# Patient Record
Sex: Male | Born: 1960 | Race: White | Hispanic: No | Marital: Married | Smoking: Current every day smoker
Health system: Southern US, Community
[De-identification: ages and names within clinical notes are randomized; demographics above are authoritative.]

## PROBLEM LIST (undated history)

## (undated) DIAGNOSIS — J449 Chronic obstructive pulmonary disease, unspecified: Secondary | ICD-10-CM

## (undated) DIAGNOSIS — M87051 Idiopathic aseptic necrosis of right femur: Secondary | ICD-10-CM

## (undated) DIAGNOSIS — Z720 Tobacco use: Secondary | ICD-10-CM

## (undated) DIAGNOSIS — K219 Gastro-esophageal reflux disease without esophagitis: Secondary | ICD-10-CM

## (undated) DIAGNOSIS — C801 Malignant (primary) neoplasm, unspecified: Secondary | ICD-10-CM

## (undated) DIAGNOSIS — I639 Cerebral infarction, unspecified: Secondary | ICD-10-CM

## (undated) DIAGNOSIS — C61 Malignant neoplasm of prostate: Secondary | ICD-10-CM

## (undated) DIAGNOSIS — Z8719 Personal history of other diseases of the digestive system: Secondary | ICD-10-CM

## (undated) DIAGNOSIS — I509 Heart failure, unspecified: Secondary | ICD-10-CM

## (undated) DIAGNOSIS — E119 Type 2 diabetes mellitus without complications: Secondary | ICD-10-CM

## (undated) DIAGNOSIS — F419 Anxiety disorder, unspecified: Secondary | ICD-10-CM

## (undated) DIAGNOSIS — I219 Acute myocardial infarction, unspecified: Secondary | ICD-10-CM

## (undated) DIAGNOSIS — G2581 Restless legs syndrome: Secondary | ICD-10-CM

## (undated) DIAGNOSIS — E785 Hyperlipidemia, unspecified: Secondary | ICD-10-CM

## (undated) DIAGNOSIS — F429 Obsessive-compulsive disorder, unspecified: Secondary | ICD-10-CM

## (undated) DIAGNOSIS — I10 Essential (primary) hypertension: Secondary | ICD-10-CM

## (undated) DIAGNOSIS — D649 Anemia, unspecified: Secondary | ICD-10-CM

## (undated) DIAGNOSIS — F17210 Nicotine dependence, cigarettes, uncomplicated: Secondary | ICD-10-CM

## (undated) DIAGNOSIS — M87052 Idiopathic aseptic necrosis of left femur: Secondary | ICD-10-CM

## (undated) DIAGNOSIS — F5104 Psychophysiologic insomnia: Secondary | ICD-10-CM

## (undated) DIAGNOSIS — R9431 Abnormal electrocardiogram [ECG] [EKG]: Secondary | ICD-10-CM

## (undated) DIAGNOSIS — R972 Elevated prostate specific antigen [PSA]: Secondary | ICD-10-CM

## (undated) HISTORY — PX: CAROTID STENT: SHX1301

## (undated) HISTORY — DX: Chronic obstructive pulmonary disease, unspecified: J44.9

## (undated) HISTORY — PX: CORONARY ANGIOPLASTY WITH STENT PLACEMENT: SHX49

## (undated) HISTORY — PX: EYE SURGERY: SHX253

## (undated) HISTORY — DX: Gastro-esophageal reflux disease without esophagitis: K21.9

## (undated) HISTORY — PX: ANKLE FRACTURE SURGERY: SHX122

## (undated) HISTORY — PX: HERNIA REPAIR: SHX51

## (undated) HISTORY — DX: Heart failure, unspecified: I50.9

## (undated) HISTORY — DX: Malignant (primary) neoplasm, unspecified: C80.1

---

## 2003-06-16 ENCOUNTER — Ambulatory Visit (HOSPITAL_COMMUNITY): Admission: RE | Admit: 2003-06-16 | Discharge: 2003-06-17 | Payer: Self-pay | Admitting: Cardiovascular Disease

## 2003-11-01 ENCOUNTER — Other Ambulatory Visit: Payer: Self-pay

## 2006-07-26 ENCOUNTER — Other Ambulatory Visit: Payer: Self-pay

## 2006-07-26 ENCOUNTER — Emergency Department: Payer: Self-pay | Admitting: Emergency Medicine

## 2006-08-12 ENCOUNTER — Inpatient Hospital Stay: Payer: Self-pay | Admitting: Internal Medicine

## 2006-08-12 ENCOUNTER — Other Ambulatory Visit: Payer: Self-pay

## 2006-08-13 ENCOUNTER — Other Ambulatory Visit: Payer: Self-pay

## 2006-08-14 ENCOUNTER — Other Ambulatory Visit: Payer: Self-pay

## 2009-10-11 ENCOUNTER — Inpatient Hospital Stay: Payer: Self-pay | Admitting: Internal Medicine

## 2010-12-23 ENCOUNTER — Inpatient Hospital Stay: Payer: Self-pay | Admitting: Internal Medicine

## 2011-04-19 ENCOUNTER — Emergency Department: Payer: Self-pay | Admitting: Emergency Medicine

## 2011-11-19 ENCOUNTER — Emergency Department: Payer: Self-pay | Admitting: *Deleted

## 2011-11-19 LAB — CBC
HCT: 41.9 % (ref 40.0–52.0)
MCH: 30.8 pg (ref 26.0–34.0)
MCHC: 33.9 g/dL (ref 32.0–36.0)
RDW: 13.3 % (ref 11.5–14.5)
WBC: 8 10*3/uL (ref 3.8–10.6)

## 2011-11-19 LAB — COMPREHENSIVE METABOLIC PANEL
Albumin: 3.7 g/dL (ref 3.4–5.0)
Alkaline Phosphatase: 57 U/L (ref 50–136)
Anion Gap: 8 (ref 7–16)
Calcium, Total: 8.7 mg/dL (ref 8.5–10.1)
Glucose: 138 mg/dL — ABNORMAL HIGH (ref 65–99)
Potassium: 3.1 mmol/L — ABNORMAL LOW (ref 3.5–5.1)
SGOT(AST): 19 U/L (ref 15–37)
SGPT (ALT): 26 U/L
Total Protein: 7.6 g/dL (ref 6.4–8.2)

## 2011-11-19 LAB — TROPONIN I: Troponin-I: <0.02 ng/mL

## 2011-11-20 ENCOUNTER — Inpatient Hospital Stay: Payer: Self-pay | Admitting: Internal Medicine

## 2011-11-20 LAB — COMPREHENSIVE METABOLIC PANEL
Albumin: 3.9 g/dL (ref 3.4–5.0)
Alkaline Phosphatase: 58 U/L (ref 50–136)
BUN: 7 mg/dL (ref 7–18)
Bilirubin,Total: 0.5 mg/dL (ref 0.2–1.0)
Co2: 30 mmol/L (ref 21–32)
Creatinine: 0.78 mg/dL (ref 0.60–1.30)
EGFR (African American): >60
EGFR (Non-African Amer.): >60
Osmolality: 278 (ref 275–301)
SGPT (ALT): 28 U/L
Sodium: 141 mmol/L (ref 136–145)

## 2011-11-20 LAB — TROPONIN I
Troponin-I: <0.02 ng/mL
Troponin-I: <0.02 ng/mL

## 2011-11-20 LAB — URINALYSIS, COMPLETE
Bilirubin,UR: NEGATIVE
Blood: NEGATIVE
Glucose,UR: NEGATIVE mg/dL (ref 0–75)
Ph: 6 (ref 4.5–8.0)
Specific Gravity: 1.047 (ref 1.003–1.030)

## 2011-11-20 LAB — CBC
HCT: 42.8 % (ref 40.0–52.0)
MCH: 31.1 pg (ref 26.0–34.0)
MCHC: 34.3 g/dL (ref 32.0–36.0)
MCV: 91 fL (ref 80–100)
RBC: 4.73 10*6/uL (ref 4.40–5.90)
RDW: 13.2 % (ref 11.5–14.5)

## 2011-11-20 LAB — CK TOTAL AND CKMB (NOT AT ARMC): CK, Total: 120 U/L (ref 35–232)

## 2011-11-21 LAB — LIPID PANEL
HDL Cholesterol: 29 mg/dL — ABNORMAL LOW (ref 40–60)
Ldl Cholesterol, Calc: 66 mg/dL (ref 0–100)
Triglycerides: 122 mg/dL (ref 0–200)

## 2011-11-21 LAB — MAGNESIUM: Magnesium: 1.7 mg/dL — ABNORMAL LOW

## 2011-11-21 LAB — POTASSIUM: Potassium: 3.4 mmol/L — ABNORMAL LOW (ref 3.5–5.1)

## 2011-11-21 LAB — CK TOTAL AND CKMB (NOT AT ARMC)
CK, Total: 91 U/L (ref 35–232)
CK-MB: 1 ng/mL (ref 0.5–3.6)
CK-MB: 0.9 ng/mL (ref 0.5–3.6)

## 2012-03-20 ENCOUNTER — Emergency Department: Payer: Self-pay | Admitting: Emergency Medicine

## 2012-05-29 ENCOUNTER — Ambulatory Visit: Payer: Self-pay | Admitting: Adult Health

## 2012-12-22 ENCOUNTER — Emergency Department: Payer: Self-pay | Admitting: Emergency Medicine

## 2012-12-22 LAB — COMPREHENSIVE METABOLIC PANEL
Albumin: 3.5 g/dL (ref 3.4–5.0)
Anion Gap: 8 (ref 7–16)
Calcium, Total: 8.5 mg/dL (ref 8.5–10.1)
Chloride: 105 mmol/L (ref 98–107)
Co2: 23 mmol/L (ref 21–32)
Creatinine: 0.57 mg/dL — ABNORMAL LOW (ref 0.60–1.30)
EGFR (African American): >60
Sodium: 136 mmol/L (ref 136–145)
Total Protein: 7.3 g/dL (ref 6.4–8.2)

## 2012-12-22 LAB — CBC
HGB: 15.3 g/dL (ref 13.0–18.0)
MCHC: 35.1 g/dL (ref 32.0–36.0)
RDW: 13.2 % (ref 11.5–14.5)

## 2012-12-22 LAB — TROPONIN I: Troponin-I: <0.02 ng/mL

## 2012-12-22 LAB — CK TOTAL AND CKMB (NOT AT ARMC): CK-MB: 2.4 ng/mL (ref 0.5–3.6)

## 2012-12-22 LAB — PROTIME-INR
INR: 1
Prothrombin Time: 13.2 secs (ref 11.5–14.7)

## 2012-12-22 LAB — APTT: Activated PTT: 34.7 secs (ref 23.6–35.9)

## 2013-05-21 ENCOUNTER — Ambulatory Visit: Payer: Self-pay | Admitting: Adult Health

## 2013-10-07 DIAGNOSIS — I639 Cerebral infarction, unspecified: Secondary | ICD-10-CM

## 2013-10-07 HISTORY — DX: Cerebral infarction, unspecified: I63.9

## 2014-02-03 ENCOUNTER — Ambulatory Visit: Payer: Self-pay | Admitting: Urology

## 2014-03-14 ENCOUNTER — Emergency Department: Payer: Self-pay | Admitting: Emergency Medicine

## 2014-05-16 ENCOUNTER — Emergency Department: Payer: Self-pay | Admitting: Emergency Medicine

## 2014-05-18 ENCOUNTER — Emergency Department: Payer: Self-pay | Admitting: Emergency Medicine

## 2014-05-18 LAB — CBC WITH DIFFERENTIAL/PLATELET
Basophil #: 0.1 10*3/uL (ref 0.0–0.1)
Basophil %: 0.7 %
EOS ABS: 0.2 10*3/uL (ref 0.0–0.7)
Eosinophil %: 2.4 %
HCT: 42.8 % (ref 40.0–52.0)
HGB: 14.7 g/dL (ref 13.0–18.0)
LYMPHS ABS: 2.6 10*3/uL (ref 1.0–3.6)
Lymphocyte %: 27.1 %
MCH: 31.5 pg (ref 26.0–34.0)
MCHC: 34.4 g/dL (ref 32.0–36.0)
MCV: 91 fL (ref 80–100)
MONO ABS: 0.6 x10 3/mm (ref 0.2–1.0)
Monocyte %: 6.3 %
NEUTROS PCT: 63.5 %
Neutrophil #: 6.2 10*3/uL (ref 1.4–6.5)
Platelet: 222 10*3/uL (ref 150–440)
RBC: 4.68 10*6/uL (ref 4.40–5.90)
RDW: 13.3 % (ref 11.5–14.5)
WBC: 9.7 10*3/uL (ref 3.8–10.6)

## 2014-05-18 LAB — COMPREHENSIVE METABOLIC PANEL
ALT: 37 U/L
Albumin: 3.6 g/dL (ref 3.4–5.0)
Alkaline Phosphatase: 67 U/L
Anion Gap: 7 (ref 7–16)
BILIRUBIN TOTAL: 0.3 mg/dL (ref 0.2–1.0)
BUN: 7 mg/dL (ref 7–18)
CHLORIDE: 106 mmol/L (ref 98–107)
Calcium, Total: 8.6 mg/dL (ref 8.5–10.1)
Co2: 27 mmol/L (ref 21–32)
Creatinine: 0.84 mg/dL (ref 0.60–1.30)
EGFR (African American): >60
EGFR (Non-African Amer.): >60
Glucose: 121 mg/dL — ABNORMAL HIGH (ref 65–99)
Osmolality: 279 (ref 275–301)
Potassium: 3.3 mmol/L — ABNORMAL LOW (ref 3.5–5.1)
SGOT(AST): 25 U/L (ref 15–37)
SODIUM: 140 mmol/L (ref 136–145)
Total Protein: 7.5 g/dL (ref 6.4–8.2)

## 2014-05-18 LAB — URINALYSIS, COMPLETE
BILIRUBIN, UR: NEGATIVE
Bacteria: NONE SEEN
Blood: NEGATIVE
Glucose,UR: NEGATIVE mg/dL (ref 0–75)
KETONE: NEGATIVE
Leukocyte Esterase: NEGATIVE
Nitrite: NEGATIVE
Ph: 6 (ref 4.5–8.0)
Protein: NEGATIVE
RBC,UR: NONE SEEN /HPF (ref 0–5)
Specific Gravity: 1.002 (ref 1.003–1.030)
Squamous Epithelial: NONE SEEN
WBC UR: NONE SEEN /HPF (ref 0–5)

## 2014-05-18 LAB — LIPASE, BLOOD: LIPASE: 138 U/L (ref 73–393)

## 2014-06-03 ENCOUNTER — Emergency Department: Payer: Self-pay | Admitting: Internal Medicine

## 2014-06-03 LAB — URINALYSIS, COMPLETE
BILIRUBIN, UR: NEGATIVE
Bacteria: NONE SEEN
Blood: NEGATIVE
GLUCOSE, UR: NEGATIVE mg/dL (ref 0–75)
Ketone: NEGATIVE
LEUKOCYTE ESTERASE: NEGATIVE
Nitrite: NEGATIVE
PROTEIN: NEGATIVE
Ph: 5 (ref 4.5–8.0)
Specific Gravity: 1.015 (ref 1.003–1.030)
Squamous Epithelial: NONE SEEN
WBC UR: 1 /HPF (ref 0–5)

## 2014-06-03 LAB — CBC WITH DIFFERENTIAL/PLATELET
BASOS PCT: 0.7 %
Basophil #: 0.1 10*3/uL (ref 0.0–0.1)
EOS ABS: 0.2 10*3/uL (ref 0.0–0.7)
Eosinophil %: 3 %
HCT: 44.8 % (ref 40.0–52.0)
HGB: 14.8 g/dL (ref 13.0–18.0)
LYMPHS PCT: 29.6 %
Lymphocyte #: 2.1 10*3/uL (ref 1.0–3.6)
MCH: 30.7 pg (ref 26.0–34.0)
MCHC: 33.1 g/dL (ref 32.0–36.0)
MCV: 93 fL (ref 80–100)
MONO ABS: 0.3 x10 3/mm (ref 0.2–1.0)
Monocyte %: 4.5 %
Neutrophil #: 4.5 10*3/uL (ref 1.4–6.5)
Neutrophil %: 62.2 %
Platelet: 227 10*3/uL (ref 150–440)
RBC: 4.83 10*6/uL (ref 4.40–5.90)
RDW: 13.4 % (ref 11.5–14.5)
WBC: 7.3 10*3/uL (ref 3.8–10.6)

## 2014-06-03 LAB — COMPREHENSIVE METABOLIC PANEL
ALBUMIN: 3.6 g/dL (ref 3.4–5.0)
ALK PHOS: 71 U/L
AST: 24 U/L (ref 15–37)
Anion Gap: 3 — ABNORMAL LOW (ref 7–16)
BUN: 8 mg/dL (ref 7–18)
Bilirubin,Total: 0.3 mg/dL (ref 0.2–1.0)
CALCIUM: 8.8 mg/dL (ref 8.5–10.1)
CHLORIDE: 106 mmol/L (ref 98–107)
CREATININE: 0.9 mg/dL (ref 0.60–1.30)
Co2: 28 mmol/L (ref 21–32)
EGFR (Non-African Amer.): >60
GLUCOSE: 141 mg/dL — AB (ref 65–99)
Osmolality: 275 (ref 275–301)
Potassium: 3.7 mmol/L (ref 3.5–5.1)
SGPT (ALT): 29 U/L
SODIUM: 137 mmol/L (ref 136–145)
TOTAL PROTEIN: 7.3 g/dL (ref 6.4–8.2)

## 2014-10-09 ENCOUNTER — Ambulatory Visit: Payer: Self-pay | Admitting: Emergency Medicine

## 2014-10-09 LAB — APTT: Activated PTT: 32.6 secs (ref 23.6–35.9)

## 2014-10-09 LAB — BASIC METABOLIC PANEL
Anion Gap: 7 (ref 7–16)
BUN: 8 mg/dL (ref 7–18)
CREATININE: 1.04 mg/dL (ref 0.60–1.30)
Calcium, Total: 8.3 mg/dL — ABNORMAL LOW (ref 8.5–10.1)
Chloride: 103 mmol/L (ref 98–107)
Co2: 30 mmol/L (ref 21–32)
EGFR (African American): >60
EGFR (Non-African Amer.): >60
Glucose: 127 mg/dL — ABNORMAL HIGH (ref 65–99)
Osmolality: 279 (ref 275–301)
POTASSIUM: 3.1 mmol/L — AB (ref 3.5–5.1)
Sodium: 140 mmol/L (ref 136–145)

## 2014-10-09 LAB — PRO B NATRIURETIC PEPTIDE: B-Type Natriuretic Peptide: 196 pg/mL — ABNORMAL HIGH (ref 0–125)

## 2014-10-09 LAB — PROTIME-INR
INR: 0.9
Prothrombin Time: 12.3 secs (ref 11.5–14.7)

## 2014-10-09 LAB — CBC
HCT: 38.9 % — ABNORMAL LOW (ref 40.0–52.0)
HGB: 13 g/dL (ref 13.0–18.0)
MCH: 30.8 pg (ref 26.0–34.0)
MCHC: 33.5 g/dL (ref 32.0–36.0)
MCV: 92 fL (ref 80–100)
Platelet: 207 10*3/uL (ref 150–440)
RBC: 4.23 10*6/uL — ABNORMAL LOW (ref 4.40–5.90)
RDW: 13.9 % (ref 11.5–14.5)
WBC: 6.7 10*3/uL (ref 3.8–10.6)

## 2014-10-09 LAB — TROPONIN I: Troponin-I: <0.02 ng/mL

## 2015-01-29 NOTE — Consult Note (Signed)
Brief Consult Note: Diagnosis: 1. Chest pain with slurred speech,likley CVA in patient with h/o CAD with stenting, tobacco abuse.   Comments: Patient has h/o CAD with multiple stents, has been on either Plavix or Effient but not ASA and presented woth signs of CVA with previous CVA in 12/2010. He denies CP at this time, cardiac enzymes negative, no caute EKG changes at this time. CTA was essentially normal.MRI pending. Will need to get echo and r/o coronary ischemia. After MRI and recommendations, suggest Lexiscan stress test.  Electronic Signatures: Radene KneeKhan, Shaukat Ali (MD)   (Signed 15-Feb-13 14:30)  Co-Signer: Brief Consult Note Maia PlanManzi, Elverda Wendel A (PA-C)   (Signed 14-Feb-13 13:08)  Authored: Brief Consult Note  Last Updated: 15-Feb-13 14:30 by Radene KneeKhan, Shaukat Ali (MD)

## 2015-01-29 NOTE — Consult Note (Signed)
PATIENT NAME:  Gary Benson, Gary Benson MR#:  [id number] DATE OF BIRTH:  1961/06/08  DATE OF CONSULTATION:  11/21/2011  REFERRING PHYSICIAN:  Dr. Marcelino Duster  CONSULTING PHYSICIAN:  Verta Ellen, PA-C/Dr. Adrian Blackwater   PRIMARY CARE PHYSICIAN: [facility]  REASON FOR CONSULTATION: Chest pain.  HISTORY OF PRESENT ILLNESS: Mr. Gary Benson is a pleasant 54 year old white male who looks much older than his stated age. Gary Benson has not been seen by our practice in several years, but does have history of coronary artery disease with stenting done both in 2004 and 2011. Gary Benson presented yesterday with droopiness of the right eyelid, a buzzing sensation in his head, chest pain, slurred speech, and numbness of his lips. Patient notes that his speech has improved but he still has numbness around his lip area. He has some residual left-sided weakness from a cerebrovascular accident he had in March 2012.   Patient had some chest pain and chest pressure yesterday that was intermittent and rated as 6 to 7/10 on the pain scale. Pain radiated into his left shoulder and was not associated with any shortness of breath, diaphoresis or nausea. He denies any orthopnea but does have coughing, wheezing, and dyspnea on exertion as he is a current smoker. Of note, patient denies any chest pain at this time.   PAST MEDICAL HISTORY:  1. Coronary artery disease. Review of his old record from our office showed he had a cardiac catheterization in September 2004 with a high-grade lesion in the LAD and second diagonal. He had descending/diagonal bifurcation PCI with stenting with two drug-eluting Cypher stents done at [facility]. Cardiac catheterization in 10/12/2009 with Dr. Kirke Corin with two stents to the mid RCA. 2. 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  3. Hypertension.  4. Hyperlipidemia.  5. Tobacco abuse.   PAST SURGICAL HISTORY: Leg/ankle reconstruction.    ALLERGIES: No known drug allergies.   HOME MEDICATIONS:  1. Chantix 0.5 mg p.o. daily.  2. Pravachol 40 mg p.o. at bedtime.  3. Metoprolol 25 mg twice daily.  4. Effient 10 mg p.o. once daily.  5. Nexium 40 mg p.o. once daily.   SOCIAL HISTORY: Patient is currently working. He smokes a 1.5 packs of cigarettes per day and has done so for the last 15 years. He denies any illicit drug use. Denies alcohol use and drinks about on 12-pack of sodas per day.   FAMILY HISTORY: Significant for coronary disease in his father and maternal uncles.   REVIEW OF SYSTEMS: Chest pain, coughing, weakness, occasional joint pain, slurred speech and numbness.   PHYSICAL EXAMINATION:  GENERAL: He is a well-developed, well-nourished male who looks older than his stated age.   VITAL SIGNS: Temperature 97.5 degrees Fahrenheit, heart rate 57, respiratory rate 18, blood pressure 119/76, oxygen saturation 97% on room air.   HEENT: Head atraumatic, normocephalic. Eyes: Patient wears glasses. Pupils are round, equal. He has pink conjunctivae. Ears and nose are normal to external inspection. Mouth good dentition. No posterior pharyngeal exudate or erythema.   NECK: Supple. Trachea is midline. Thyroid is smooth and mobile.   LUNGS: Clear to auscultation bilaterally with no adventitious breath sounds appreciated. He is not using any accessory muscles.   CARDIOVASCULAR: Regular rate and rhythm. No murmurs, rubs, or gallops appreciated.   ABDOMEN: Soft and nontender to palpation. Bowel sounds are present in all four quadrants. There is no hepatosplenomegaly noted.   EXTREMITIES: No cyanosis, clubbing, or  edema.   NEUROLOGICAL: Patient has decreased grip strength on the left.   LABORATORY, DIAGNOSTIC AND RADIOLOGICAL DATA: Glucose 83, BUN 7, creatinine 0.78, sodium 141, potassium 3.4, chloride 103, CO2 30, estimated GFR is greater than 60, VLDL 24, LDL 66, total cholesterol 403, triglycerides 122. HDL 29. Total  calcium 8.9, total protein 7.7, albumin 3.9, total bilirubin 0.5, alkaline phosphatase 58, AST 23, ALT 28, total CK 101, CK-MB 0.9. Troponin I is negative at 0.02 (on three occasions). White blood cell count 6.3, hemoglobin 14.7, hematocrit 42.8, platelet count 214,000. EKG on admission: Normal sinus rhythm with nonspecific ST changes. Heart rate 67. CTA of the head and neck with and without showed a normal CT angiogram of the brain, no acute abnormality of the intracranial vasculature, no evidence of carotid or vertebral artery dissection. No evidence of hemodynamically significant carotid stenotic lesion.   ASSESSMENT/PLAN:  1. Acute cerebrovascular accident. Patient has numbness on the right side of his face, lips, and had slurred speech. He has already been started on Lovenox and has been on Effient. CTA was unremarkable and MRI results are pending. 2. Chest pain in patient with history of coronary artery disease status post multiple stents to the RCA and LAD. Patient has not had cardiac follow up in many years. We will need to rule out in-stent restenosis. There is no signs of acute coronary syndrome. There are no acute ST-T wave changes on his EKG and cardiac enzymes have been negative x3. Need to get repeat echocardiogram and get LexiScan stress test after his acute stroke symptoms have subsided. 3. Hypertension. Patient's blood pressure well controlled on the current regimen.  4. Tobacco abuse. Patient is trying to quit smoking on Chantix. He has tried to quit smoke multiple times in the past. Importance of this was stressed to the patient since he does have a history of CVA and coronary artery disease.   The assessment and plan of this patient was discussed with Dr. Adrian Blackwater who agrees with the above. Thank you very much for this consultation and allowing [place] to follow this patient with you.   ____________________________ Verta Ellen, PA-C mam:cms D: 11/21/2011 13:23:10 ET T: 11/21/2011  14:07:09 ET JOB#: [id number] cc: Verta Ellen, PA-C, <Dictator> [facility] ELECTRONICALLY SIGNED 11/22/2011 11:32

## 2015-01-29 NOTE — Discharge Summary (Signed)
PATIENT NAME:  Gary Benson, Gary Benson MR#:  [id number] DATE OF BIRTH:  11/30/1960  DATE OF ADMISSION:  11/20/2011 DATE OF DISCHARGE:  11/22/2011  ADMITTING DIAGNOSES:  1. Cerebrovascular accident. 2. Chest pain. 3. Coronary artery disease. 4. Tobacco abuse.   DISCHARGE DIAGNOSES:  1. Dizziness, numbness, unsteady gait, likely old cerebrovascular accident residuals versus inner ear inflammation. No new stroke on magnetic resonance imaging of brain.  2. Chest pain, noncardiac likely, negative stress test.  3. Hypertension.  4. Hyperlipidemia with LDL 66. 5. Tobacco abuse.  6. Bradycardia to beta blockers.   DISCHARGE CONDITION: Stable.   DISCHARGE MEDICATIONS: Patient is to resume his outpatient medications which are:  1. Nexium 20 mg p.o. daily.  2. Pravastatin 40 mg p.o. at bedtime.  3. Tylenol 500 mg caplets 2 tablets as needed for headache.   ADDITIONAL MEDICATIONS:  1. Nicoderm patch 21 mg topically daily.  2. Toprol-XL 25 mg p.o. daily.  3. Meclizine 25 mg p.o. t.i.d. as needed. 4. Aspirin 325 mg p.o. daily.  5. Lisinopril 2.5 mg p.o. daily.   NOTE: Patient is not to take high dose of metoprolol or Effient.    HOME OXYGEN: None.   DIET: 2 grams salt, low fat, low cholesterol.   PHYSICAL ACTIVITY LIMITATIONS: As tolerated.   REFERRAL: Home health physical therapy.   FOLLOW UP: Follow-up appointment with [facility] in two days after discharge.   CONSULTANT : Dr. Adrian Blackwater.   LABORATORY, DIAGNOSTIC AND RADIOLOGICAL DATA: MRI of brain without contrast 11/21/2011: Chronic white matter ischemic changes, stable when compared to previous study without evidence of acute abnormalities. Myoview stress test 11/22/2011 revealing normal study with normal left ventricular systolic function. CT angiography of head and neck with and without contrast combo 11/20/2011 showed no acute abnormalities in intracranial vasculature. No evidence of carotid or vertebral artery dissection.  There is no evidence of hemodynamically significant carotid artery stenosis. Echocardiogram 11/21/2011: Normal chamber size and function with no thrombi in heart. Ejection fraction equal or more than 55%.   HOSPITAL COURSE: Patient is a 54 year old male with past medical history significant for history of stroke, history of coronary artery disease who presented to the hospital with complaints of dizziness as well as some chest pains. He also had some slurring of speech as well as numbness in his lips. On arrival to the Emergency Room patient was noted to have temperature 97.8, pulse 62, respiration 20, blood pressure 145/84, saturation was normal. His physical exam was unremarkable. He had no decrease in sensation in upper and lower extremities. He was admitted, however, with concerns of acute CVA. He was brought to the hospital for evaluation for possible new stroke was entertained. Patient underwent MRI of his brain as well as echocardiogram and CTA of his neck arteries and those all were unremarkable revealing no significant changes. He was evaluated by physical therapist who recommended home health physical therapy which will be arranged for him upon discharge.   In regards to his chest pains. He was evaluated by cardiologist, initially by PA, [facility], and later on by Dr. Adrian Blackwater. Cardiologist recommended stress test which was performed and was negative for cardiac injury or cardiac ischemia. It was felt, however, that patient was having problems with bradycardia while he was in the hospital so it was felt that patient would benefit from decreasing beta blocker dose. It was also noted that patient's lightheadedness as well as dizziness could have been related to some element of bradycardia. His  blood pressure was high so additional medications were added for his blood pressure management. On the day of discharge, however, patient felt satisfactory, did not complain of any significant distress  and his vitals were stable. His temperature was 97.7, pulse 84, respiration rate 18, blood pressure 116/75, saturation was 98% to 99% on room air at rest.   In regards to his history of stroke, it was felt that patient would benefit from aspirin therapy which he was not using in the past so aspirin at 325 mg p.o. daily dose was ordered. Patient was advised not to take Effient as it would not help him and it would not be good for stroke prophylaxis or therapy. If, however, patient needs to be on some medications for his coronary artery disease then of course he may benefit from Plavix, which would work for stroke prophylaxis as well but at this point Effient is not recommended medication for stroke prophylaxis or treatment of stroke. Patient is to continue aspirin therapy. He is to follow up with his primary care physician for further recommendations.   Patient's lipid panel was checked and LDL was found to be 66. It was felt that patient's lipids were all in good control. Patient total cholesterol level was 119, triglycerides level was 122 and HDL was 29. Patient was advised to continue cholesterol therapy as well as low fat, low cholesterol diet. He is to follow up with his primary care physician for further recommendations. He is being discharged in stable condition with above-mentioned medications and follow up.   TIME SPENT: 40 minutes.  ____________________________ Katharina Caper, MD rv:cms D: 12/06/2011 14:02:00 ET T: 12/06/2011 15:01:53 ET JOB#: [id number]  cc: Katharina Caper, MD, <Dictator> [facility] Gary Tetro MD ELECTRONICALLY SIGNED 12/07/2011 14:16

## 2015-01-29 NOTE — H&P (Signed)
PATIENT NAME:  Gary Benson, Gary Benson MR#:  [id number] DATE OF BIRTH:  [birth year]  DATE OF ADMISSION:  11/20/2011  PRIMARY CARE PHYSICIAN: [facility]  CHIEF COMPLAINT: Chest pain and buzzing in his head.   HISTORY OF PRESENT ILLNESS: This is a 54 year old male who said he felt kind of bad yesterday, had some chest pain that was on and off and what he described as buzzing in his head. Today he developed slurred speech and numbness of his lips. Speech is improved some but he still feels numb around his lips. He does have a history of coronary artery disease and cerebrovascular disease. Currently he is pain free.   PAST MEDICAL HISTORY:  1. Coronary artery disease, status post stenting x4.  2. Cerebrovascular accident.  3. Hypertension.  4. Hyperlipidemia.  5. Tobacco abuse.   PAST SURGICAL HISTORY: Leg surgery.   ALLERGIES: No known drug allergies.   CURRENT MEDICATIONS:  1. Chantix 0.5 mg daily.  2. Pravachol 40 mg at bedtime.  3. Metoprolol 25 mg b.i.d.  4. Effient 10 mg daily.  5. Nexium 40 mg daily.   SOCIAL HISTORY: He smokes 1-1/2 packs of cigarettes a day. Does not drink alcohol.   FAMILY HISTORY: Significant for coronary artery disease.    REVIEW OF SYSTEMS: CONSTITUTIONAL: No fever or chills. EYES: No blurred vision. ENT: No hearing loss. CARDIOVASCULAR: He has had some chest pain. PULMONARY: No shortness of breath. GASTROINTESTINAL: No nausea, vomiting, or diarrhea. GENITOURINARY: No dysuria. ENDOCRINE: No heat or cold intolerance. INTEGUMENT: No rash. MUSCULOSKELETAL: Occasional joint pain. NEUROLOGIC: He has had slurred speech and numbness.   PHYSICAL EXAMINATION:  VITAL SIGNS: Temperature 97.8, pulse 62, respirations 20, blood pressure 145/84.   GENERAL: This is a well-nourished white male in no acute distress.   HEENT: The pupils are equal, round, reactive to light. Sclerae is nonicteric. Oral mucosa is moist. Oropharynx is clear. Nasopharynx is clear.   NECK:  Supple. No JVD, lymphadenopathy or thyromegaly.   CARDIOVASCULAR: Regular rate and rhythm. No murmurs, rubs, or gallops.   LUNGS: Clear to auscultation. No dullness to percussion. He is not using accessory muscles.   ABDOMEN: Soft, nontender, nondistended. Bowel sounds are positive. No hepatosplenomegaly. No masses.   EXTREMITIES: There is no edema. There is full range of motion in upper and lower extremities.   NEUROLOGIC: Cranial nerves II through XII are intact. He is alert and oriented x4. There is no decrease in sensation in the upper and lower extremities.   SKIN: Moist with no rash.   LABORATORY, DIAGNOSTIC AND RADIOLOGICAL DATA: EKG shows normal sinus rhythm with nonspecific T wave abnormalities and some possible left ventricular hypertrophy by voltage. Troponin is less than 0.02.   ASSESSMENT AND PLAN:  1. Acute cerebrovascular accident. He appears to be having another stroke. He does have mild improvement but still has numbness on the right side of his face some and on his lips. Slurred speech is getting a little bit better. Go ahead and keep him on his Effient, add some low dose Lovenox. Will go and get MRI. I am not going to repeat his carotid Doppler's at this point or echo.  2. Chest pain. Does have a history of coronary artery disease. He has not been re-evaluated since he had his stents placed. He probably should undergo some stress testing; however, I want to settle the issue about the stroke first before proceeding with this. We will check cardiac enzymes.  3. Coronary artery disease. Will continue his  current medications.  4. Hypertension. Will continue his metoprolol. Do not want to acutely lower his blood pressure in the setting of possible acute stroke.  5. Tobacco abuse, is ongoing. He is already on Chantix. I counseled him for smoking cessation for about 10 minutes. We are going to add a nicotine patch.   TIME SPENT ON ADMISSION: 45  minutes.  ____________________________ Gracelyn Nurse, MD jdj:cms D: 11/20/2011 19:58:59 ET T: 11/21/2011 07:20:31 ET JOB#: [id number]  cc: Gracelyn Nurse, MD, <Dictator> [facility] Gracelyn Nurse MD ELECTRONICALLY SIGNED 11/30/2011 17:32

## 2015-02-05 NOTE — Consult Note (Signed)
PATIENT NAME:  Gary Benson, Gary Benson MR#:  [id number] DATE OF BIRTH:  12-Aug-1961  DATE OF CONSULTATION:  10/09/2014  REFERRING PHYSICIAN:  [facility] CONSULTING PHYSICIAN:  Dow AdolphMatthew Rein, MD  GASTROINTESTINAL CONSULTATION:   REASON FOR CONSULTATION:  Inability to swallow, likely food bolus.  HISTORY OF PRESENT ILLNESS:     HISTORY OF PRESENT ILLNESS:  Gary Benson is a 54 year old male with a past medical history notable for a CVA, coronary disease status post MI and stent, hypertension, and COPD, who is presenting to the Emergency Room for evaluation of inability to swallow. Gary Benson reports that starting around 4 in the afternoon, when he was eating some beef stew, some beef seems to have gotten stuck in his chest. Ever since that point of time, he could not swallow even any liquid.   He does report several previous episodes of food getting stuck. However, none lasted more than a few minutes. Other than this, he denies any other trouble with dysphagia, any GERD, any melena, rectal bleeding, weight loss, nausea, or vomiting. He does take a Prilosec daily.   PAST MEDICAL HISTORY: 1.  CVA.  2.  Coronary disease status post MI and PCI.  3.  Hypertension.  4.  Chronic obstructive pulmonary disease.  HOME MEDICATIONS:  1.  Prilosec.  2.  Unknown antihypertensive.  3.  Aspirin 325 mg daily.   FAMILY HISTORY:  No family history of GI malignancy.   SOCIAL HISTORY:  He is an ongoing smoker. He denies alcohol.   REVIEW OF SYSTEMS:  A 10-system review was conducted. It is negative except as stated in the HPI.   PHYSICAL EXAMINATION:  VITAL SIGNS:  Stable.  GENERAL:  Alert and oriented x 4.  No acute distress. Appears stated age. HEENT:  Normocephalic/atraumatic. Extraocular movements are intact. Anicteric. NECK: Soft, supple. JVP appears normal. No adenopathy. CHEST:  Clear to auscultation. No wheeze or crackle. Respirations unlabored. HEART:  Regular. No murmur, rub, or gallop.   Normal S1 and S2. ABDOMEN:  Soft, nontender, nondistended.  Normoactive bowel sounds in all 4 quadrants.  No organomegaly. No masses. EXTREMITIES:  No swelling. Well perfused. SKIN:  No rash or lesion. Skin color, texture, and turgor are normal. NEUROLOGICAL:  Grossly intact. PSYCHIATRIC:  Normal tone and affect. MUSCULOSKELETAL:  No joint swelling or erythema.   ASSESSMENT AND PLAN:  Inability to swallow, likely food bolus. I do suspect that he likely does have an impacted food bolus. This needs to be removed endoscopically to prevent esophageal erosion and eventual perforation. I did discuss the risks and benefits with Gary Benson, and he is in agreement with going ahead with his upper endoscopy. Further recommendations will be pending the findings on upper endoscopy.   Thank you for this consult.   ____________________________ Dow AdolphMatthew Rein, MD mr:nb D: 10/09/2014 23:03:31 ET T: 10/09/2014 23:13:06 ET JOB#: [id number]  cc: Dow AdolphMatthew Rein, MD, <Dictator> Kathalene FramesMATTHEW G REIN MD ELECTRONICALLY SIGNED 10/10/2014 14:20

## 2015-02-16 ENCOUNTER — Ambulatory Visit: Payer: Self-pay

## 2015-07-07 ENCOUNTER — Emergency Department
Admission: EM | Admit: 2015-07-07 | Discharge: 2015-07-07 | Disposition: A | Discharge status: Home / Self Care | Payer: Self-pay | Attending: Student | Admitting: Student

## 2015-07-07 ENCOUNTER — Encounter: Payer: Self-pay | Admitting: Emergency Medicine

## 2015-07-07 DIAGNOSIS — Y998 Other external cause status: Secondary | ICD-10-CM | POA: Insufficient documentation

## 2015-07-07 DIAGNOSIS — Y9389 Activity, other specified: Secondary | ICD-10-CM | POA: Insufficient documentation

## 2015-07-07 DIAGNOSIS — Y288XXA Contact with other sharp object, undetermined intent, initial encounter: Secondary | ICD-10-CM | POA: Insufficient documentation

## 2015-07-07 DIAGNOSIS — Y9289 Other specified places as the place of occurrence of the external cause: Secondary | ICD-10-CM | POA: Insufficient documentation

## 2015-07-07 DIAGNOSIS — Z23 Encounter for immunization: Secondary | ICD-10-CM | POA: Insufficient documentation

## 2015-07-07 DIAGNOSIS — Z720 Tobacco use: Secondary | ICD-10-CM | POA: Insufficient documentation

## 2015-07-07 DIAGNOSIS — I10 Essential (primary) hypertension: Secondary | ICD-10-CM | POA: Insufficient documentation

## 2015-07-07 DIAGNOSIS — S61512A Laceration without foreign body of left wrist, initial encounter: Secondary | ICD-10-CM | POA: Insufficient documentation

## 2015-07-07 HISTORY — DX: Essential (primary) hypertension: I10

## 2015-07-07 MED ORDER — TETANUS-DIPHTH-ACELL PERTUSSIS 5-2.5-18.5 LF-MCG/0.5 IM SUSP
0.5000 mL | Freq: Once | INTRAMUSCULAR | Status: AC
  Administered 2015-07-07: 0.5 mL via INTRAMUSCULAR
  Filled 2015-07-07: qty 0.5

## 2015-07-07 NOTE — Discharge Instructions (Signed)
Skin Tear Care °A skin tear is when the top layer of skin peels off. To repair the skin, your doctor may use:  °· Tape. °· Skin adhesive strips. °HOME CARE °· Change bandages (dressings) once a day or as told by your doctor. °¨ Gently clean the area with salt (saline) solution or with a mild soap and water. °¨ Do not rub the injured skin dry. Let the area air dry. °¨ Put petroleum jelly or antibiotic cream on the tear. Do not allow a scab to form. °¨ If the bandage sticks, moisten it with warm soapy water and remove it. °· Protect the injured skin until it has healed. °· Only take medicine as told by your doctor. °· Take showers or baths using warm soapy water. Apply a new bandage after the shower or bath. °· Keep all doctor visits as told. °GET HELP RIGHT AWAY IF:  °· You have redness, puffiness (swelling), or more pain in the tear. °· You have a yellowish-white fluid (pus) coming from the tear. °· You have chills. °· You have a red streak that goes away from the tear. °· You have a bad smell coming from the tear or bandage. °· You have a fever or lasting symptoms for more than 2-3 days. °· You have a fever and your symptoms suddenly get worse. °MAKE SURE YOU:  °· Understand these instructions. °· Will watch this condition. °· Will get help right away if you are not doing well or get worse. °Document Released: 07/02/2008 Document Revised: 06/17/2012 Document Reviewed: 04/06/2012 °[employer]® Patient Information ©2015 [employer]. This information is not intended to replace advice given to you by your health care provider. Make sure you discuss any questions you have with your health care provider. ° °

## 2015-07-07 NOTE — ED Notes (Signed)
Laceration to left arm

## 2015-07-07 NOTE — ED Provider Notes (Signed)
CSN: [id number]     Arrival date & time 07/07/15  1739 History   First MD Initiated Contact with Patient 07/07/15 1814     Chief Complaint  Patient presents with  . Laceration     (Consider location/radiation/quality/duration/timing/severity/associated sxs/prior Treatment) HPI  54 year old male presents to emergency department for evaluation of her left wrist soft tissue injury. Approximately 1 PM today the piece of metal pipe graced his right wrist on the dorsum and patient suffered a skin tear. He is on Plavix, has had a lot of bleeding. He has been wearing gauze with tape since the injury. He did apply Neosporin. Skin continues to ooze. He denies any numbness tingling or wrist pain. No pain with wrist range of motion. Eyes any other injuries to his body.  Past Medical History  Diagnosis Date  . Hypertension    Past Surgical History  Procedure Laterality Date  . Carotid stent     No family history on file. Social History  Substance Use Topics  . Smoking status: Current Every Day Smoker  . Smokeless tobacco: None  . Alcohol Use: None    Review of Systems  Constitutional: Negative.  Negative for fever, chills, activity change and appetite change.  HENT: Negative for congestion, ear pain, mouth sores, rhinorrhea, sinus pressure, sore throat and trouble swallowing.   Eyes: Negative for photophobia, pain and discharge.  Respiratory: Negative for cough, chest tightness and shortness of breath.   Cardiovascular: Negative for chest pain and leg swelling.  Gastrointestinal: Negative for nausea, vomiting, abdominal pain, diarrhea and abdominal distention.  Genitourinary: Negative for dysuria and difficulty urinating.  Musculoskeletal: Negative for back pain, arthralgias and gait problem.  Skin: Positive for wound. Negative for color change and rash.  Neurological: Negative for dizziness and headaches.  Hematological: Negative for adenopathy.  Psychiatric/Behavioral: Negative for  behavioral problems and agitation.      Allergies  Review of patient's allergies indicates no known allergies.  Home Medications   Prior to Admission medications   Not on File   BP 135/80 mmHg  Pulse 78  Temp(Src) 98.1 F (36.7 C) (Oral)  Resp 16  Ht  (1.753 m)  Wt 188 lb (85.276 kg)  BMI 27.75 kg/m2  SpO2 99% Physical Exam  Constitutional: He is oriented to person, place, and time. He appears well-developed and well-nourished.  HENT:  Head: Normocephalic and atraumatic.  Eyes: Conjunctivae and EOM are normal. Pupils are equal, round, and reactive to light.  Neck: Normal range of motion. Neck supple.  Cardiovascular: Normal rate and intact distal pulses.   Pulmonary/Chest: Effort normal. No respiratory distress.  Musculoskeletal:  Examination of the left wrist shows patient has no swelling warmth or effusion. No erythema. On the dorsum of the wrist there is a 4 cm in diameter skin tear that is superficial. There is mild bloody serous drainage. No evidence of foreign body. He has no pain with wrist range of motion. No tendon deficits noted.  Neurological: He is alert and oriented to person, place, and time.  Skin: Skin is warm and dry.  Psychiatric: He has a normal mood and affect. His behavior is normal. Judgment and thought content normal.    ED Course  Procedures (including critical care time) Labs Review Labs Reviewed - No data to display  Imaging Review No results found. I have personally reviewed and evaluated these images and lab results as part of my medical decision-making.   EKG Interpretation None      MDM  Final diagnoses:  Tear of skin of left wrist, initial encounter    54 year old male with skin tear to the left dorsum of the wrist. No evidence of bone or tendon injury. Tetanus was updated in the emergency department. Surgicel was applied along with a sterile dressing. Patient will change dressing daily with Vaseline gauze. Return to the ER  or PCP if any warmth erythema or drainage.    Evon Slack, PA-C 07/07/15 1830  Gayla Doss, MD 07/08/15 [phone]

## 2015-07-09 ENCOUNTER — Encounter: Payer: Self-pay | Admitting: Emergency Medicine

## 2015-07-09 ENCOUNTER — Emergency Department
Admission: EM | Admit: 2015-07-09 | Discharge: 2015-07-09 | Disposition: A | Discharge status: Home / Self Care | Payer: Self-pay | Attending: Emergency Medicine | Admitting: Emergency Medicine

## 2015-07-09 DIAGNOSIS — Z79899 Other long term (current) drug therapy: Secondary | ICD-10-CM | POA: Insufficient documentation

## 2015-07-09 DIAGNOSIS — I10 Essential (primary) hypertension: Secondary | ICD-10-CM | POA: Insufficient documentation

## 2015-07-09 DIAGNOSIS — Z5189 Encounter for other specified aftercare: Secondary | ICD-10-CM

## 2015-07-09 DIAGNOSIS — Z720 Tobacco use: Secondary | ICD-10-CM | POA: Insufficient documentation

## 2015-07-09 DIAGNOSIS — Z4801 Encounter for change or removal of surgical wound dressing: Secondary | ICD-10-CM | POA: Insufficient documentation

## 2015-07-09 DIAGNOSIS — Z7982 Long term (current) use of aspirin: Secondary | ICD-10-CM | POA: Insufficient documentation

## 2015-07-09 NOTE — ED Provider Notes (Signed)
[facility] Emergency Department Provider Note  ____________________________________________  Time seen: Approximately 11:46 AM  I have reviewed the triage vital signs and the nursing notes.   HISTORY  Chief Complaint Wound Check   HPI Gary Benson is a 54 y.o. male who presents for evaluation of left forearm abrasion follow-up. Patient states that he was seen here 2 days ago for the same and placed with Surgicel and gauze wrap. Return for evaluation. Denies any complaints except occasional breakthrough bleeding.   Past Medical History  Diagnosis Date  . Hypertension     There are no active problems to display for this patient.   Past Surgical History  Procedure Laterality Date  . Carotid stent      Current Outpatient Rx  Name  Route  Sig  Dispense  Refill  . aspirin EC 325 MG tablet   Oral   Take 325 mg by mouth daily.         Marland Kitchen esomeprazole (NEXIUM) 20 MG capsule   Oral   Take 20 mg by mouth daily at 12 noon.         Marland Kitchen lisinopril (PRINIVIL,ZESTRIL) 2.5 MG tablet   Oral   Take 2.5 mg by mouth daily.         . metoprolol succinate (TOPROL-XL) 25 MG 24 hr tablet   Oral   Take 25 mg by mouth daily.         . rosuvastatin (CRESTOR) 5 MG tablet   Oral   Take 5 mg by mouth daily.           Allergies Review of patient's allergies indicates no known allergies.  No family history on file.  Social History Social History  Substance Use Topics  . Smoking status: Current Every Day Smoker  . Smokeless tobacco: None  . Alcohol Use: No    Review of Systems Constitutional: No fever/chills Eyes: No visual changes. ENT: No sore throat. Cardiovascular: Denies chest pain. Respiratory: Denies shortness of breath. Gastrointestinal: No abdominal pain.  No nausea, no vomiting.  No diarrhea.  No constipation. Genitourinary: Negative for dysuria. Musculoskeletal: Negative for back pain. Skin: Negative for rash, no evidence of  infection minimal bleeding noted to left forearm. Neurological: Negative for headaches, focal weakness or numbness.  10-point ROS otherwise negative.  ____________________________________________   PHYSICAL EXAM:  VITAL SIGNS: ED Triage Vitals  Enc Vitals Group     BP 07/09/15 1121 146/77 mmHg     Pulse Rate 07/09/15 1121 73     Resp 07/09/15 1121 20     Temp 07/09/15 1121 97.6 F (36.4 C)     Temp Source 07/09/15 1121 Oral     SpO2 07/09/15 1121 100 %     Weight 07/09/15 1121 172 lb (78.019 kg)     Height 07/09/15 1121  (1.753 m)     Head Cir --      Peak Flow --      Pain Score --      Pain Loc --      Pain Edu? --      Excl. in GC? --     Constitutional: Alert and oriented. Well appearing and in no acute distress. Eyes: Conjunctivae are normal. PERRL. EOMI. Head: Atraumatic. Nose: No congestion/rhinnorhea. Mouth/Throat: Mucous membranes are moist.  Oropharynx non-erythematous. Neck: No stridor.   Cardiovascular: Normal rate, regular rhythm. Grossly normal heart sounds.  Good peripheral circulation. Respiratory: Normal respiratory effort.  No retractions. Lungs CTAB. Gastrointestinal: Soft  and nontender. No distention. No abdominal bruits. No CVA tenderness. Musculoskeletal: No lower extremity tenderness nor edema.  No joint effusions. Neurologic:  Normal speech and language. No gross focal neurologic deficits are appreciated. No gait instability. Skin:  Skin is warm, dry and intact. Minimal bruising noted to the wound. Otherwise no erythema edema or drainage. Psychiatric: Mood and affect are normal. Speech and behavior are normal.  ____________________________________________   LABS (all labs ordered are listed, but only abnormal results are displayed)  Labs Reviewed - No data to display ____________________________________________     PROCEDURES  Procedure(s) performed: None  Critical Care performed:  No  ____________________________________________   INITIAL IMPRESSION / ASSESSMENT AND PLAN / ED COURSE  Pertinent labs & imaging results that were available during my care of the patient were reviewed by me and considered in my medical decision making (see chart for details).  Recheck wound left anterior forearm. HER cell applied gauze wrap placed patient to return in 48 hours for recheck if needed. ____________________________________________   FINAL CLINICAL IMPRESSION(S) / ED DIAGNOSES  Final diagnoses:  Visit for wound check     Evangeline Dakin, PA-C 07/09/15 1231  Governor Rooks, MD 07/10/15 [phone]

## 2015-07-09 NOTE — ED Notes (Signed)
States he was seen Friday for same. Small amt of bleeding noted thur dressing. Denies new injury

## 2015-07-09 NOTE — ED Notes (Signed)
Stats he was seen on Friday for wound to left forearm. States he stuck a piece of metal to same arm   conts to bleed

## 2015-07-09 NOTE — Discharge Instructions (Signed)

## 2015-07-11 ENCOUNTER — Emergency Department
Admission: EM | Admit: 2015-07-11 | Discharge: 2015-07-11 | Disposition: A | Discharge status: Home / Self Care | Payer: Self-pay | Attending: Emergency Medicine | Admitting: Emergency Medicine

## 2015-07-11 ENCOUNTER — Encounter: Payer: Self-pay | Admitting: Emergency Medicine

## 2015-07-11 DIAGNOSIS — Z5189 Encounter for other specified aftercare: Secondary | ICD-10-CM

## 2015-07-11 DIAGNOSIS — Z7982 Long term (current) use of aspirin: Secondary | ICD-10-CM | POA: Insufficient documentation

## 2015-07-11 DIAGNOSIS — Z79899 Other long term (current) drug therapy: Secondary | ICD-10-CM | POA: Insufficient documentation

## 2015-07-11 DIAGNOSIS — I10 Essential (primary) hypertension: Secondary | ICD-10-CM | POA: Insufficient documentation

## 2015-07-11 DIAGNOSIS — Z720 Tobacco use: Secondary | ICD-10-CM | POA: Insufficient documentation

## 2015-07-11 DIAGNOSIS — Z4800 Encounter for change or removal of nonsurgical wound dressing: Secondary | ICD-10-CM | POA: Insufficient documentation

## 2015-07-11 NOTE — ED Notes (Signed)
Was seen Sunday for left forearm wound area had some small amt of bleeding noted . Dressing in place and dry at present

## 2015-07-11 NOTE — ED Notes (Signed)
Was seen on Sunday for wound to left forearm.

## 2015-07-11 NOTE — ED Provider Notes (Signed)
[facility] Emergency Department Provider Note  ____________________________________________  Time seen: Approximately 8:01 AM  I have reviewed the triage vital signs and the nursing notes.   HISTORY  Chief Complaint Wound Check   HPI BRETON BERNS is a 54 y.o. male who reports to the emergency department today for a wound check. The initial injury was 07/07/2015. A metal pipe scraped the top of his leftforearm and due to taking Plavix and was difficult to stop the bleeding. He was seen here 2 days ago and a Surgicel strip was applied. Patient states that the bandage has remained dry and there is been no additional bleeding.   Past Medical History  Diagnosis Date  . Hypertension     There are no active problems to display for this patient.   Past Surgical History  Procedure Laterality Date  . Carotid stent      Current Outpatient Rx  Name  Route  Sig  Dispense  Refill  . aspirin EC 325 MG tablet   Oral   Take 325 mg by mouth daily.         Marland Kitchen esomeprazole (NEXIUM) 20 MG capsule   Oral   Take 20 mg by mouth daily at 12 noon.         Marland Kitchen lisinopril (PRINIVIL,ZESTRIL) 2.5 MG tablet   Oral   Take 2.5 mg by mouth daily.         . metoprolol succinate (TOPROL-XL) 25 MG 24 hr tablet   Oral   Take 25 mg by mouth daily.         . rosuvastatin (CRESTOR) 5 MG tablet   Oral   Take 5 mg by mouth daily.           Allergies Review of patient's allergies indicates no known allergies.  No family history on file.  Social History Social History  Substance Use Topics  . Smoking status: Current Every Day Smoker  . Smokeless tobacco: None  . Alcohol Use: No    Review of Systems Constitutional: No fever/chills Eyes: No visual changes. ENT: No sore throat. Cardiovascular: Denies chest pain. Respiratory: Denies shortness of breath. Gastrointestinal: No abdominal pain.  No nausea, no vomiting.  Musculoskeletal: Negative for back  pain. Skin: Positive for skin tear to the left forearm. Neurological: Negative for headaches, focal weakness or numbness. 10-point ROS otherwise negative.  ____________________________________________   PHYSICAL EXAM:  VITAL SIGNS: ED Triage Vitals  Enc Vitals Group     BP 07/11/15 0756 142/87 mmHg     Pulse Rate 07/11/15 0756 73     Resp 07/11/15 0756 18     Temp 07/11/15 0756 97.9 F (36.6 C)     Temp Source 07/11/15 0756 Oral     SpO2 07/11/15 0756 100 %     Weight 07/11/15 0801 172 lb (78.019 kg)     Height 07/11/15 0801  (1.753 m)     Head Cir --      Peak Flow --      Pain Score --      Pain Loc --      Pain Edu? --      Excl. in GC? --     Constitutional: Alert and oriented. Well appearing and in no acute distress. Eyes: Conjunctivae are normal. PERRL. EOMI. Head: Atraumatic. Nose: No congestion/rhinnorhea. Mouth/Throat: Mucous membranes are moist.  Neck: No stridor. Cardiovascular:Good peripheral circulation. Respiratory: Normal respiratory effort.  No retractions.  Musculoskeletal: Full ROM throughout. Neurologic:  Normal speech and language. No gross focal neurologic deficits are appreciated. Speech is normal. No gait instability. Skin: Surgicel clean and dry. Wound appears to be healing. No evidence of cellulitis noted today.   Psychiatric: Mood and affect are normal. Speech and behavior are normal.  ____________________________________________   LABS (all labs ordered are listed, but only abnormal results are displayed)  Labs Reviewed - No data to display ____________________________________________  RADIOLOGY  Not indicated ____________________________________________   PROCEDURES  Procedure(s) performed: Left forearm wound redressed with Curlex  ___________________________________________   INITIAL IMPRESSION / ASSESSMENT AND PLAN / ED COURSE  Pertinent labs & imaging results that were available during my care of the patient were  reviewed by me and considered in my medical decision making (see chart for details).  Patient was advised to follow up with the primary care provider for symptoms of concern or return to the emergency department if unable to schedule an appointment. ____________________________________________   FINAL CLINICAL IMPRESSION(S) / ED DIAGNOSES  Final diagnoses:  Visit for wound check       Chinita Pester, FNP 07/11/15 1109  Jene Every, MD 07/11/15 1524

## 2015-07-12 ENCOUNTER — Encounter: Payer: Self-pay | Admitting: Emergency Medicine

## 2015-07-12 ENCOUNTER — Emergency Department
Admission: EM | Admit: 2015-07-12 | Discharge: 2015-07-12 | Disposition: A | Discharge status: Home / Self Care | Payer: Self-pay | Attending: Emergency Medicine | Admitting: Emergency Medicine

## 2015-07-12 DIAGNOSIS — Z79899 Other long term (current) drug therapy: Secondary | ICD-10-CM | POA: Insufficient documentation

## 2015-07-12 DIAGNOSIS — Z5189 Encounter for other specified aftercare: Secondary | ICD-10-CM

## 2015-07-12 DIAGNOSIS — Z7982 Long term (current) use of aspirin: Secondary | ICD-10-CM | POA: Insufficient documentation

## 2015-07-12 DIAGNOSIS — Z720 Tobacco use: Secondary | ICD-10-CM | POA: Insufficient documentation

## 2015-07-12 DIAGNOSIS — I10 Essential (primary) hypertension: Secondary | ICD-10-CM | POA: Insufficient documentation

## 2015-07-12 DIAGNOSIS — Z4801 Encounter for change or removal of surgical wound dressing: Secondary | ICD-10-CM | POA: Insufficient documentation

## 2015-07-12 NOTE — ED Provider Notes (Signed)
[facility] Emergency Department Provider Note  ____________________________________________  Time seen: Approximately 9:14 PM  I have reviewed the triage vital signs and the nursing notes.   HISTORY  Chief Complaint Wound Check    HPI Gary Benson is a 54 y.o. male who returns to the emergency department for wound check. He cut his left hand 6 days prior and has been seen in the emergency department for wound care. Patient is on blood thinners and has intermittent continual bleeding. Patient states that "everything is okay except that my dressing is stuck to the wound." He denies any other complaints. He denies any nausea tingling distal to wound. Denies fevers or chills. He denies continued bleeding.   Past Medical History  Diagnosis Date  . Hypertension     There are no active problems to display for this patient.   Past Surgical History  Procedure Laterality Date  . Carotid stent      Current Outpatient Rx  Name  Route  Sig  Dispense  Refill  . aspirin EC 325 MG tablet   Oral   Take 325 mg by mouth daily.         Marland Kitchen esomeprazole (NEXIUM) 20 MG capsule   Oral   Take 20 mg by mouth daily at 12 noon.         Marland Kitchen lisinopril (PRINIVIL,ZESTRIL) 2.5 MG tablet   Oral   Take 2.5 mg by mouth daily.         . metoprolol succinate (TOPROL-XL) 25 MG 24 hr tablet   Oral   Take 25 mg by mouth daily.         . rosuvastatin (CRESTOR) 5 MG tablet   Oral   Take 5 mg by mouth daily.           Allergies Review of patient's allergies indicates no known allergies.  History reviewed. No pertinent family history.  Social History Social History  Substance Use Topics  . Smoking status: Current Every Day Smoker  . Smokeless tobacco: None  . Alcohol Use: No    Review of Systems Constitutional: No fever/chills Eyes: No visual changes. ENT: No sore throat. Cardiovascular: Denies chest pain. Respiratory: Denies shortness of  breath. Gastrointestinal: No abdominal pain.  No nausea, no vomiting.  No diarrhea.  No constipation. Genitourinary: Negative for dysuria. Musculoskeletal: Negative for back pain. Skin: Negative for rash. Positive for wound to the dorsal aspect left hand. Neurological: Negative for headaches, focal weakness or numbness.  10-point ROS otherwise negative.  ____________________________________________   PHYSICAL EXAM:  VITAL SIGNS: ED Triage Vitals  Enc Vitals Group     BP 07/12/15 2010 144/88 mmHg     Pulse Rate 07/12/15 2010 74     Resp 07/12/15 2010 18     Temp 07/12/15 2010 98.1 F (36.7 C)     Temp Source 07/12/15 2010 Oral     SpO2 07/12/15 2010 100 %     Weight 07/12/15 2010 170 lb (77.111 kg)     Height 07/12/15 2010  (1.753 m)     Head Cir --      Peak Flow --      Pain Score 07/12/15 2011 5     Pain Loc --      Pain Edu? --      Excl. in GC? --     Constitutional: Alert and oriented. Well appearing and in no acute distress. Eyes: Conjunctivae are normal. PERRL. EOMI. Head: Atraumatic. Nose: No congestion/rhinnorhea.  Mouth/Throat: Mucous membranes are moist.  Oropharynx non-erythematous. Neck: No stridor.   Cardiovascular: Normal rate, regular rhythm. Grossly normal heart sounds.  Good peripheral circulation. Respiratory: Normal respiratory effort.  No retractions. Lungs CTAB. Gastrointestinal: Soft and nontender. No distention. No abdominal bruits. No CVA tenderness. Musculoskeletal: No lower extremity tenderness nor edema.  No joint effusions. Neurologic:  Normal speech and language. No gross focal neurologic deficits are appreciated. No gait instability. Skin:  Skin is warm, dry.No rash noted. Patient's wound is covered with Surgicel dressing. Dressing is left in place. No bleeding through dressing. No surrounding erythema or edema. Psychiatric: Mood and affect are normal. Speech and behavior are normal.  ____________________________________________    LABS (all labs ordered are listed, but only abnormal results are displayed)  Labs Reviewed - No data to display ____________________________________________  EKG   ____________________________________________  RADIOLOGY   ____________________________________________   PROCEDURES  Procedure(s) performed: Dressing change. the patient and placed dressing was removed after dampening same with normal saline. The external gauze was removed with a Surgicel dressing left intact and in place. The wound was recovered with Surgicel, nonadhesive dressing, gauze over top, and wrapped with Kerlix. No uncontrolled bleeding. No cough occasions. PMS intact prior to and status post dressing change.  Critical Care performed: No  ____________________________________________   INITIAL IMPRESSION / ASSESSMENT AND PLAN / ED COURSE  Pertinent labs & imaging results that were available during my care of the patient were reviewed by me and considered in my medical decision making (see chart for details).  The patient was here for wound recheck and dressing change. This was performed without complications. Advised patient to use nonadhesive dressing material. Patient advised understanding. Advised the patient to follow up with primary care or emergency Department for any symptoms or complaints. Patient verbalizes understanding. ____________________________________________   FINAL CLINICAL IMPRESSION(S) / ED DIAGNOSES  Final diagnoses:  Encounter for wound care      Racheal Patches, PA-C 07/12/15 2152  Arnaldo Natal, MD 07/13/15 [phone]

## 2015-07-12 NOTE — ED Notes (Signed)
Pt states he cut the top of his L hand last Thursday, and was seen, pt states he started bleeding again on Sunday and was given a dressing to stop the bleeding and he was told that it would dissolve on it's own. Pt states he went to change the dressing tonight after having it on for 2 days but the dressing was stuck. This RN attempt to remove dressing, pt states pain, this RN to wait for PA to remove dressing at this time.

## 2015-07-12 NOTE — Discharge Instructions (Signed)
Abrasion An abrasion is a cut or scrape on the outer surface of your skin. An abrasion does not extend through all of the layers of your skin. It is important to care for your abrasion properly to prevent infection. CAUSES Most abrasions are caused by falling on or gliding across the ground or another surface. When your skin rubs on something, the outer and inner layer of skin rubs off.  SYMPTOMS A cut or scrape is the main symptom of this condition. The scrape may be bleeding, or it may appear red or pink. If there was an associated fall, there may be an underlying bruise. DIAGNOSIS An abrasion is diagnosed with a physical exam. TREATMENT Treatment for this condition depends on how large and deep the abrasion is. Usually, your abrasion will be cleaned with water and mild soap. This removes any dirt or debris that may be stuck. An antibiotic ointment may be applied to the abrasion to help prevent infection. A bandage (dressing) may be placed on the abrasion to keep it clean. You may also need a tetanus shot. HOME CARE INSTRUCTIONS Medicines  Take or apply medicines only as directed by your health care provider.  If you were prescribed an antibiotic ointment, finish all of it even if you start to feel better. Wound Care  Clean the wound with mild soap and water 2-3 times per day or as directed by your health care provider. Pat your wound dry with a clean towel. Do not rub it.  There are many different ways to close and cover a wound. Follow instructions from your health care provider about:  Wound care.  Dressing changes and removal.  Check your wound every day for signs of infection. Watch for:  Redness, swelling, or pain.  Fluid, blood, or pus. General Instructions  Keep the dressing dry as directed by your health care provider. Do not take baths, swim, use a hot tub, or do anything that would put your wound underwater until your health care provider approves.  If there is  swelling, raise (elevate) the injured area above the level of your heart while you are sitting or lying down.  Keep all follow-up visits as directed by your health care provider. This is important. SEEK MEDICAL CARE IF:  You received a tetanus shot and you have swelling, severe pain, redness, or bleeding at the injection site.  Your pain is not controlled with medicine.  You have increased redness, swelling, or pain at the site of your wound. SEEK IMMEDIATE MEDICAL CARE IF:  You have a red streak going away from your wound.  You have a fever.  You have fluid, blood, or pus coming from your wound.  You notice a bad smell coming from your wound or your dressing.   This information is not intended to replace advice given to you by your health care provider. Make sure you discuss any questions you have with your health care provider.   Document Released: 07/03/2005 Document Revised: 06/14/2015 Document Reviewed: 09/21/2014 Elsevier Interactive Patient Education 2016 [employer]  

## 2015-07-12 NOTE — ED Notes (Signed)
AAOx3.  Skin warm and dry.  NAD 

## 2015-08-15 ENCOUNTER — Other Ambulatory Visit: Payer: Self-pay

## 2015-08-16 LAB — BASIC METABOLIC PANEL
BUN: 6 mg/dL (ref 4–21)
CREATININE: 0.6 mg/dL (ref 0.6–1.3)
GLUCOSE: 107 mg/dL
POTASSIUM: 3.3 mmol/L — AB (ref 3.4–5.3)
SODIUM: 138 mmol/L (ref 137–147)

## 2015-08-16 LAB — HEPATIC FUNCTION PANEL
ALT: 16 U/L (ref 10–40)
AST: 19 U/L (ref 14–40)
Alkaline Phosphatase: 74 U/L (ref 25–125)
BILIRUBIN, TOTAL: 0.3 mg/dL

## 2015-08-16 LAB — CBC AND DIFFERENTIAL
HCT: 39 % — AB (ref 41–53)
Hemoglobin: 13.4 g/dL — AB (ref 13.5–17.5)
Neutrophils Absolute: 5 /uL
Platelets: 299 10*3/uL (ref 150–399)
WBC: 9.1 10*3/mL

## 2015-08-16 LAB — LIPID PANEL
Cholesterol: 155 mg/dL (ref 0–200)
HDL: 27 mg/dL — AB (ref 35–70)
LDL Cholesterol: 88 mg/dL
Triglycerides: 198 mg/dL — AB (ref 40–160)

## 2015-08-16 LAB — HEMOGLOBIN A1C: HEMOGLOBIN A1C: 5.9

## 2015-08-16 LAB — TSH: TSH: 1.44 u[IU]/mL (ref 0.41–5.90)

## 2015-08-24 ENCOUNTER — Encounter: Payer: Self-pay | Admitting: *Deleted

## 2015-08-24 ENCOUNTER — Emergency Department
Admission: EM | Admit: 2015-08-24 | Discharge: 2015-08-24 | Disposition: A | Discharge status: Home / Self Care | Payer: Self-pay | Attending: Emergency Medicine | Admitting: Emergency Medicine

## 2015-08-24 DIAGNOSIS — M545 Low back pain, unspecified: Secondary | ICD-10-CM

## 2015-08-24 DIAGNOSIS — F17200 Nicotine dependence, unspecified, uncomplicated: Secondary | ICD-10-CM | POA: Insufficient documentation

## 2015-08-24 DIAGNOSIS — I10 Essential (primary) hypertension: Secondary | ICD-10-CM | POA: Insufficient documentation

## 2015-08-24 DIAGNOSIS — Z79899 Other long term (current) drug therapy: Secondary | ICD-10-CM | POA: Insufficient documentation

## 2015-08-24 DIAGNOSIS — Z7982 Long term (current) use of aspirin: Secondary | ICD-10-CM | POA: Insufficient documentation

## 2015-08-24 HISTORY — DX: Acute myocardial infarction, unspecified: I21.9

## 2015-08-24 HISTORY — DX: Cerebral infarction, unspecified: I63.9

## 2015-08-24 LAB — URINALYSIS COMPLETE WITH MICROSCOPIC (ARMC ONLY)
BILIRUBIN URINE: NEGATIVE
Bacteria, UA: NONE SEEN
Glucose, UA: NEGATIVE mg/dL
HGB URINE DIPSTICK: NEGATIVE
KETONES UR: NEGATIVE mg/dL
Nitrite: NEGATIVE
PH: 6 (ref 5.0–8.0)
Protein, ur: NEGATIVE mg/dL
SPECIFIC GRAVITY, URINE: 1.016 (ref 1.005–1.030)
Squamous Epithelial / LPF: NONE SEEN

## 2015-08-24 MED ORDER — HYDROCODONE-ACETAMINOPHEN 5-325 MG PO TABS
1.0000 | ORAL_TABLET | Freq: Once | ORAL | Status: AC
  Administered 2015-08-24: 1 via ORAL
  Filled 2015-08-24: qty 1

## 2015-08-24 MED ORDER — HYDROCODONE-ACETAMINOPHEN 5-325 MG PO TABS: 1.0000 | ORAL_TABLET | ORAL | Status: DC | PRN

## 2015-08-24 MED ORDER — DIAZEPAM 5 MG PO TABS
2.5000 mg | ORAL_TABLET | Freq: Once | ORAL | Status: AC
  Administered 2015-08-24: 2.5 mg via ORAL
  Filled 2015-08-24: qty 1

## 2015-08-24 MED ORDER — DIAZEPAM 2 MG PO TABS: 2.0000 mg | ORAL_TABLET | Freq: Three times a day (TID) | ORAL | Status: DC | PRN

## 2015-08-24 NOTE — Discharge Instructions (Signed)
Your back pain appears to be musculoskeletal. U have notable tenderness on palpation of the muscles on the right including the quadratus lumborum. He has some discomfort when he moved. Here abdomen appears soft and benign. Your urine was normal without signs of red blood cells or infection. Take ibuprofen, 6 mg, 3 times a day. If this is not adequate, you may add Vicodin and or low dose Valium to help prevent muscle spasm. Follow with her regular doctor.  Back Pain, Adult Back pain is very common. The pain often gets better over time. The cause of back pain is usually not dangerous. Most people can learn to manage their back pain on their own.  HOME CARE  Watch your back pain for any changes. The following actions may help to lessen any pain you are feeling:  Stay active. Start with short walks on flat ground if you can. Try to walk farther each day.  Exercise regularly as told by your doctor. Exercise helps your back heal faster. It also helps avoid future injury by keeping your muscles strong and flexible.  Do not sit, drive, or stand in one place for more than 30 minutes.  Do not stay in bed. Resting more than 1-2 days can slow down your recovery.  Be careful when you bend or lift an object. Use good form when lifting:  Bend at your knees.  Keep the object close to your body.  Do not twist.  Sleep on a firm mattress. Lie on your side, and bend your knees. If you lie on your back, put a pillow under your knees.  Take medicines only as told by your doctor.  Put ice on the injured area.  Put ice in a plastic bag.  Place a towel between your skin and the bag.  Leave the ice on for 20 minutes, 2-3 times a day for the first 2-3 days. After that, you can switch between ice and heat packs.  Avoid feeling anxious or stressed. Find good ways to deal with stress, such as exercise.  Maintain a healthy weight. Extra weight puts stress on your back. GET HELP IF:   You have pain that does  not go away with rest or medicine.  You have worsening pain that goes down into your legs or buttocks.  You have pain that does not get better in one week.  You have pain at night.  You lose weight.  You have a fever or chills. GET HELP RIGHT AWAY IF:   You cannot control when you poop (bowel movement) or pee (urinate).  Your arms or legs feel weak.  Your arms or legs lose feeling (numbness).  You feel sick to your stomach (nauseous) or throw up (vomit).  You have belly (abdominal) pain.  You feel like you may pass out (faint).   This information is not intended to replace advice given to you by your health care provider. Make sure you discuss any questions you have with your health care provider.   Document Released: 03/11/2008 Document Revised: 10/14/2014 Document Reviewed: 01/25/2014 Elsevier Interactive Patient Education [employer]2016 Elsevier Inc.

## 2015-08-24 NOTE — ED Notes (Signed)
Right low flank pain, pain moves across low back, nausea,had sweats last pm

## 2015-08-24 NOTE — ED Provider Notes (Signed)
[facility] Emergency Department Provider Note  ____________________________________________  Time seen: 8 AM   I have reviewed the triage vital signs and the nursing notes.   HISTORY  Chief Complaint Flank Pain     HPI Gary Benson is a 54 y.o. male who presents to the emergency department with pain in his right back. He reports it began yesterday morning. It hurts more when he moves. He has not had pain like this before.  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  He denies any fever or dysuria. He denies any chest pain or shortness of breath. He does report he woke this morning with some diaphoresis.    Past Medical History  Diagnosis Date  . Hypertension   . Stroke (HCC)   . Myocardial infarction (HCC)     There are no active problems to display for this patient.   Past Surgical History  Procedure Laterality Date  . Carotid stent    . Coronary angioplasty with stent placement    . Hernia repair      Current Outpatient Rx  Name  Route  Sig  Dispense  Refill  . aspirin EC 325 MG tablet   Oral   Take 325 mg by mouth daily.         Marland Kitchen. esomeprazole (NEXIUM) 20 MG capsule   Oral   Take 20 mg by mouth daily.          Marland Kitchen. lisinopril (PRINIVIL,ZESTRIL) 2.5 MG tablet   Oral   Take 2.5 mg by mouth daily.         . metoprolol succinate (TOPROL-XL) 25 MG 24 hr tablet   Oral   Take 25 mg by mouth daily.         . rosuvastatin (CRESTOR) 5 MG tablet   Oral   Take 5 mg by mouth at bedtime.          . diazepam (VALIUM) 2 MG tablet   Oral   Take 1 tablet (2 mg total) by mouth every 8 (eight) hours as needed for anxiety or muscle spasms.   16 tablet   0   . HYDROcodone-acetaminophen (NORCO/VICODIN) 5-325 MG tablet   Oral   Take 1 tablet by mouth every 4 (four) hours as needed for moderate pain.   10 tablet   0     Allergies Review of patient's allergies indicates no known  allergies.  No family history on file.  Social History Social History  Substance Use Topics  . Smoking status: Current Every Day Smoker  . Smokeless tobacco: None  . Alcohol Use: No    Review of Systems  Constitutional: Negative for fatigue. ENT: Negative for congestion. Cardiovascular: Negative for chest pain. Respiratory: Negative for cough. Gastrointestinal: Negative for abdominal pain, vomiting and diarrhea. Genitourinary: Negative for dysuria. Musculoskeletal: Right-sided flank pain. See history of present illness. Skin: Negative for rash. Neurological: Negative for headache or focal weakness   10-point ROS otherwise negative.  ____________________________________________   PHYSICAL EXAM:  VITAL SIGNS: ED Triage Vitals  Enc Vitals Group     BP 08/24/15 0746 140/87 mmHg     Pulse Rate 08/24/15 0746 82     Resp 08/24/15 0746 20     Temp 08/24/15 0746 97.5 F (36.4 C)     Temp Source 08/24/15 0746 Oral     SpO2 08/24/15 0746 100 %     Weight 08/24/15 0746 170 lb (77.111 kg)  Height 08/24/15 0746  (1.753 m)     Head Cir --      Peak Flow --      Pain Score 08/24/15 0747 10     Pain Loc --      Pain Edu? --      Excl. in GC? --     Constitutional: Alert and oriented. Overall well-appearing without distress, though he does have discomfort when he sits up and moves.. ENT   Head: Normocephalic and atraumatic.   Nose: No congestion/rhinnorhea.       Mouth: No erythema, no swelling   Cardiovascular: Normal rate, regular rhythm, no murmur noted Respiratory:  Normal respiratory effort, no tachypnea.    Breath sounds are clear and equal bilaterally.  Gastrointestinal: Soft and nontender. No distention.  Back: Notable muscle tension and tenderness in the right paraspinal muscles and quadratus lumborum. Musculoskeletal: No deformity noted. Nontender with normal range of motion in all extremities, though he does have pain in his right back with  movement.  No noted edema. Neurologic:  Communicative. Normal appearing spontaneous movement in all 4 extremities. No gross focal neurologic deficits are appreciated.  Skin:  Skin is warm, dry. No rash noted. Psychiatric: Mood and affect are normal. Speech and behavior are normal.  ____________________________________________    LABS (pertinent positives/negatives)  Labs Reviewed  URINALYSIS COMPLETEWITH MICROSCOPIC ([facility] ONLY) - Abnormal; Notable for the following:    Color, Urine YELLOW (*)    APPearance CLEAR (*)    Leukocytes, UA TRACE (*)    All other components within normal limits     ____________________________________________  ____________________________________________   INITIAL IMPRESSION / ASSESSMENT AND PLAN / ED COURSE  Pertinent labs & imaging results that were available during my care of the patient were reviewed by me and considered in my medical decision making (see chart for details).  53 year old male with notable right back pain. It is exquisitely tender on palpation with tight muscles. It hurts when he moves. He does not appear to have CVA tenderness in addition to this point tenderness. He has no abdominal pain. He denies any dysuria. We will check a urine sample to ensure do not see signs of infection or red blood cells. The patient drove here. We have discussed use of basic medications and discharge or using stronger medications and having someone pick him up. He prefers to have treatment now. We'll treated with 1 Vicodin and 2.5 mg of Valium by mouth.  ____________________________________________   FINAL CLINICAL IMPRESSION(S) / ED DIAGNOSES  Final diagnoses:  Right-sided low back pain without sciatica      Gary Ramus, MD 08/24/15 [phone]

## 2015-08-29 ENCOUNTER — Ambulatory Visit: Payer: Self-pay

## 2015-12-19 ENCOUNTER — Other Ambulatory Visit: Payer: Self-pay

## 2015-12-25 ENCOUNTER — Other Ambulatory Visit: Payer: Self-pay | Admitting: Nurse Practitioner

## 2015-12-28 MED ORDER — PANTOPRAZOLE SODIUM 20 MG PO TBEC: 20.0000 mg | DELAYED_RELEASE_TABLET | Freq: Every day | ORAL | Status: DC

## 2016-01-02 ENCOUNTER — Ambulatory Visit: Payer: Self-pay

## 2016-01-25 ENCOUNTER — Other Ambulatory Visit: Payer: Self-pay | Admitting: Urology

## 2016-01-30 ENCOUNTER — Other Ambulatory Visit: Payer: Self-pay | Admitting: Nurse Practitioner

## 2016-02-02 ENCOUNTER — Telehealth: Payer: Self-pay

## 2016-02-02 DIAGNOSIS — K219 Gastro-esophageal reflux disease without esophagitis: Secondary | ICD-10-CM

## 2016-02-02 MED ORDER — PANTOPRAZOLE SODIUM 20 MG PO TBEC: 20.0000 mg | DELAYED_RELEASE_TABLET | Freq: Every day | ORAL | Status: DC

## 2016-02-06 NOTE — Telephone Encounter (Signed)
Okay to refill? 

## 2016-04-27 ENCOUNTER — Other Ambulatory Visit: Payer: Self-pay | Admitting: Urology

## 2016-04-29 ENCOUNTER — Other Ambulatory Visit: Payer: Self-pay | Admitting: Nurse Practitioner

## 2016-04-29 ENCOUNTER — Other Ambulatory Visit: Payer: Self-pay | Admitting: Urology

## 2016-05-08 ENCOUNTER — Other Ambulatory Visit: Payer: Self-pay | Admitting: Urology

## 2016-05-09 ENCOUNTER — Other Ambulatory Visit: Payer: Self-pay | Admitting: Urology

## 2016-07-25 ENCOUNTER — Other Ambulatory Visit: Payer: Self-pay | Admitting: Nurse Practitioner

## 2016-07-26 ENCOUNTER — Other Ambulatory Visit: Payer: Self-pay

## 2016-07-26 DIAGNOSIS — I10 Essential (primary) hypertension: Secondary | ICD-10-CM

## 2016-07-26 MED ORDER — LISINOPRIL 5 MG PO TABS: ORAL_TABLET | ORAL | 0 refills | Status: DC

## 2016-07-26 MED ORDER — METOPROLOL SUCCINATE ER 50 MG PO TB24: ORAL_TABLET | ORAL | 0 refills | Status: DC

## 2016-07-26 NOTE — Telephone Encounter (Signed)
Refill sent to pharmacy. PT needs appt to receive further refills. Will give him 30 day supply.

## 2016-09-21 ENCOUNTER — Emergency Department: Payer: Self-pay

## 2016-09-21 ENCOUNTER — Encounter: Payer: Self-pay | Admitting: Emergency Medicine

## 2016-09-21 ENCOUNTER — Inpatient Hospital Stay
Admission: EM | Admit: 2016-09-21 | Discharge: 2016-09-22 | DRG: 069 | Disposition: A | Discharge status: Home / Self Care | Payer: Self-pay | Attending: Specialist | Admitting: Specialist

## 2016-09-21 DIAGNOSIS — K219 Gastro-esophageal reflux disease without esophagitis: Secondary | ICD-10-CM | POA: Diagnosis present

## 2016-09-21 DIAGNOSIS — I110 Hypertensive heart disease with heart failure: Secondary | ICD-10-CM | POA: Diagnosis present

## 2016-09-21 DIAGNOSIS — R531 Weakness: Secondary | ICD-10-CM

## 2016-09-21 DIAGNOSIS — R079 Chest pain, unspecified: Secondary | ICD-10-CM

## 2016-09-21 DIAGNOSIS — G8324 Monoplegia of upper limb affecting left nondominant side: Secondary | ICD-10-CM | POA: Diagnosis present

## 2016-09-21 DIAGNOSIS — I639 Cerebral infarction, unspecified: Secondary | ICD-10-CM | POA: Diagnosis present

## 2016-09-21 DIAGNOSIS — I252 Old myocardial infarction: Secondary | ICD-10-CM

## 2016-09-21 DIAGNOSIS — Z8249 Family history of ischemic heart disease and other diseases of the circulatory system: Secondary | ICD-10-CM

## 2016-09-21 DIAGNOSIS — Z833 Family history of diabetes mellitus: Secondary | ICD-10-CM

## 2016-09-21 DIAGNOSIS — E876 Hypokalemia: Secondary | ICD-10-CM | POA: Diagnosis present

## 2016-09-21 DIAGNOSIS — Z79899 Other long term (current) drug therapy: Secondary | ICD-10-CM

## 2016-09-21 DIAGNOSIS — F17210 Nicotine dependence, cigarettes, uncomplicated: Secondary | ICD-10-CM | POA: Diagnosis present

## 2016-09-21 DIAGNOSIS — G459 Transient cerebral ischemic attack, unspecified: Principal | ICD-10-CM | POA: Diagnosis present

## 2016-09-21 DIAGNOSIS — Z7982 Long term (current) use of aspirin: Secondary | ICD-10-CM

## 2016-09-21 DIAGNOSIS — R262 Difficulty in walking, not elsewhere classified: Secondary | ICD-10-CM

## 2016-09-21 DIAGNOSIS — J449 Chronic obstructive pulmonary disease, unspecified: Secondary | ICD-10-CM | POA: Diagnosis present

## 2016-09-21 DIAGNOSIS — Z955 Presence of coronary angioplasty implant and graft: Secondary | ICD-10-CM

## 2016-09-21 DIAGNOSIS — E785 Hyperlipidemia, unspecified: Secondary | ICD-10-CM | POA: Diagnosis present

## 2016-09-21 DIAGNOSIS — I2510 Atherosclerotic heart disease of native coronary artery without angina pectoris: Secondary | ICD-10-CM | POA: Diagnosis present

## 2016-09-21 DIAGNOSIS — I509 Heart failure, unspecified: Secondary | ICD-10-CM | POA: Diagnosis present

## 2016-09-21 LAB — BASIC METABOLIC PANEL
Anion gap: 9 (ref 5–15)
BUN: 9 mg/dL (ref 6–20)
CHLORIDE: 104 mmol/L (ref 101–111)
CO2: 26 mmol/L (ref 22–32)
Calcium: 9.1 mg/dL (ref 8.9–10.3)
Creatinine, Ser: 0.66 mg/dL (ref 0.61–1.24)
GFR calc Af Amer: >60 mL/min (ref >60)
GFR calc non Af Amer: >60 mL/min (ref >60)
GLUCOSE: 119 mg/dL — AB (ref 65–99)
POTASSIUM: 3 mmol/L — AB (ref 3.5–5.1)
Sodium: 139 mmol/L (ref 135–145)

## 2016-09-21 LAB — TROPONIN I: Troponin I: <0.03 ng/mL (ref <0.03)

## 2016-09-21 LAB — CBC
HEMATOCRIT: 40.3 % (ref 40.0–52.0)
Hemoglobin: 14.2 g/dL (ref 13.0–18.0)
MCH: 31.4 pg (ref 26.0–34.0)
MCHC: 35.3 g/dL (ref 32.0–36.0)
MCV: 88.9 fL (ref 80.0–100.0)
Platelets: 243 10*3/uL (ref 150–440)
RBC: 4.53 MIL/uL (ref 4.40–5.90)
RDW: 12.7 % (ref 11.5–14.5)
WBC: 9.5 10*3/uL (ref 3.8–10.6)

## 2016-09-21 LAB — MAGNESIUM: MAGNESIUM: 1.7 mg/dL (ref 1.7–2.4)

## 2016-09-21 LAB — PROTIME-INR
INR: 0.92
Prothrombin Time: 12.4 seconds (ref 11.4–15.2)

## 2016-09-21 LAB — APTT: aPTT: 35 seconds (ref 24–36)

## 2016-09-21 MED ORDER — ENOXAPARIN SODIUM 40 MG/0.4ML ~~LOC~~ SOLN
40.0000 mg | SUBCUTANEOUS | Status: DC
  Administered 2016-09-21: 21:00:00 40 mg via SUBCUTANEOUS
  Filled 2016-09-21: qty 0.4

## 2016-09-21 MED ORDER — PANTOPRAZOLE SODIUM 40 MG PO TBEC
40.0000 mg | DELAYED_RELEASE_TABLET | Freq: Every day | ORAL | Status: DC
  Administered 2016-09-22: 09:00:00 40 mg via ORAL
  Filled 2016-09-21: qty 1

## 2016-09-21 MED ORDER — HYDROCODONE-ACETAMINOPHEN 5-325 MG PO TABS
1.0000 | ORAL_TABLET | Freq: Four times a day (QID) | ORAL | Status: DC | PRN
  Administered 2016-09-21 – 2016-09-22 (×3): 1 via ORAL
  Filled 2016-09-21 (×3): qty 1

## 2016-09-21 MED ORDER — ACETAMINOPHEN 325 MG PO TABS: 650.0000 mg | ORAL_TABLET | ORAL | Status: DC | PRN

## 2016-09-21 MED ORDER — ACETAMINOPHEN 650 MG RE SUPP: 650.0000 mg | RECTAL | Status: DC | PRN

## 2016-09-21 MED ORDER — SODIUM CHLORIDE 0.9 % IV SOLN
INTRAVENOUS | Status: DC
  Administered 2016-09-21 – 2016-09-22 (×2): via INTRAVENOUS

## 2016-09-21 MED ORDER — ASPIRIN 81 MG PO CHEW
324.0000 mg | CHEWABLE_TABLET | Freq: Once | ORAL | Status: AC
  Administered 2016-09-21: 324 mg via ORAL
  Filled 2016-09-21: qty 4

## 2016-09-21 MED ORDER — IPRATROPIUM-ALBUTEROL 0.5-2.5 (3) MG/3ML IN SOLN
3.0000 mL | Freq: Once | RESPIRATORY_TRACT | Status: AC
  Administered 2016-09-21: 3 mL via RESPIRATORY_TRACT
  Filled 2016-09-21: qty 3

## 2016-09-21 MED ORDER — INFLUENZA VAC SPLIT QUAD 0.5 ML IM SUSY
0.5000 mL | PREFILLED_SYRINGE | INTRAMUSCULAR | Status: AC
  Administered 2016-09-22: 0.5 mL via INTRAMUSCULAR
  Filled 2016-09-21: qty 0.5

## 2016-09-21 MED ORDER — PNEUMOCOCCAL VAC POLYVALENT 25 MCG/0.5ML IJ INJ
0.5000 mL | INJECTION | INTRAMUSCULAR | Status: AC
  Administered 2016-09-22: 0.5 mL via INTRAMUSCULAR
  Filled 2016-09-21: qty 0.5

## 2016-09-21 MED ORDER — DIAZEPAM 5 MG PO TABS
5.0000 mg | ORAL_TABLET | Freq: Once | ORAL | Status: AC
  Administered 2016-09-21: 5 mg via ORAL
  Filled 2016-09-21: qty 1

## 2016-09-21 MED ORDER — POTASSIUM CHLORIDE CRYS ER 20 MEQ PO TBCR
60.0000 meq | EXTENDED_RELEASE_TABLET | Freq: Once | ORAL | Status: AC
  Administered 2016-09-21: 60 meq via ORAL
  Filled 2016-09-21: qty 3

## 2016-09-21 MED ORDER — SENNOSIDES-DOCUSATE SODIUM 8.6-50 MG PO TABS: 1.0000 | ORAL_TABLET | Freq: Every evening | ORAL | Status: DC | PRN

## 2016-09-21 MED ORDER — ATORVASTATIN CALCIUM 20 MG PO TABS
40.0000 mg | ORAL_TABLET | Freq: Every day | ORAL | Status: DC
  Administered 2016-09-22: 18:00:00 40 mg via ORAL
  Filled 2016-09-21 (×2): qty 2

## 2016-09-21 MED ORDER — METOPROLOL SUCCINATE ER 25 MG PO TB24
25.0000 mg | ORAL_TABLET | Freq: Every day | ORAL | Status: DC
  Administered 2016-09-22: 25 mg via ORAL
  Filled 2016-09-21: qty 1

## 2016-09-21 MED ORDER — DIAZEPAM 2 MG PO TABS: 2.0000 mg | ORAL_TABLET | Freq: Three times a day (TID) | ORAL | Status: DC | PRN

## 2016-09-21 MED ORDER — ACETAMINOPHEN 500 MG PO TABS
1000.0000 mg | ORAL_TABLET | Freq: Once | ORAL | Status: AC
  Administered 2016-09-21: 1000 mg via ORAL
  Filled 2016-09-21: qty 2

## 2016-09-21 MED ORDER — ASPIRIN 300 MG RE SUPP: 300.0000 mg | Freq: Every day | RECTAL | Status: DC

## 2016-09-21 MED ORDER — NICOTINE 14 MG/24HR TD PT24
14.0000 mg | MEDICATED_PATCH | Freq: Every day | TRANSDERMAL | Status: DC
  Administered 2016-09-21 – 2016-09-22 (×2): 14 mg via TRANSDERMAL
  Filled 2016-09-21 (×2): qty 1

## 2016-09-21 MED ORDER — ASPIRIN 325 MG PO TABS
325.0000 mg | ORAL_TABLET | Freq: Every day | ORAL | Status: DC
  Administered 2016-09-22: 09:00:00 325 mg via ORAL
  Filled 2016-09-21: qty 1

## 2016-09-21 MED ORDER — ACETAMINOPHEN 160 MG/5ML PO SOLN: 650.0000 mg | ORAL | Status: DC | PRN

## 2016-09-21 MED ORDER — STROKE: EARLY STAGES OF RECOVERY BOOK
Freq: Once | Status: AC
  Administered 2016-09-21: 1

## 2016-09-21 NOTE — ED Triage Notes (Signed)
Patient reports chest tightness to left/mid chest that radiates to back. Also reports left arm and leg weakness (has previous history of stroke with left sided minimal weakness). States both unilateral weakness and chest tightness started yesterday. Reports mild slurred speech, none noted in triage. Reports blurry vision and "feeling swimmy headed with headache" intermittently since this am.

## 2016-09-21 NOTE — H&P (Addendum)
[facility] - [place] at [facility]   PATIENT NAME: Gary Benson    MR#:  [id number]  DATE OF BIRTH:  [birth year]  DATE OF ADMISSION:  09/21/2016  PRIMARY CARE PHYSICIAN: [facility]   REQUESTING/REFERRING PHYSICIAN: Willy Eddy, MD  CHIEF COMPLAINT:   Chief Complaint  Patient presents with  . Chest Pain   Left arm and leg weakness since yesterday. HISTORY OF PRESENT ILLNESS:  Gary Benson  is a 55 y.o. male with a known history of Hypertension, CAD, CVA, CHF and COPD. The patient presents to the ED with left arm and leg weakness and tingling since yesterday. He also complains of headache and dizziness, chest tightness on and off. He denies any syncope, loss of consciousness or seizure. No incontinence. CAT scan of the head concerns about acute CVA.The patient said he is taking aspirin 81 mg at home.  PAST MEDICAL HISTORY:   Past Medical History:  Diagnosis Date  . CHF (congestive heart failure) (HCC)   . COPD (chronic obstructive pulmonary disease) (HCC)   . GERD (gastroesophageal reflux disease)   . Hypertension   . Myocardial infarction   . Stroke [facility])     PAST SURGICAL HISTORY:   Past Surgical History:  Procedure Laterality Date  . CAROTID STENT    . CORONARY ANGIOPLASTY WITH STENT PLACEMENT    . HERNIA REPAIR      SOCIAL HISTORY:   Social History  Substance Use Topics  . Smoking status: Current Every Day Smoker    Packs/day: 1.00  . Smokeless tobacco: Never Used  . Alcohol use No    FAMILY HISTORY:   Family History  Problem Relation Age of Onset  . Diabetes Mother   . Diabetes Sister     DRUG ALLERGIES:   Allergies  Allergen Reactions  . Bee Venom Anaphylaxis    REVIEW OF SYSTEMS:   Review of Systems  Constitutional: Negative for chills, fever and malaise/fatigue.  HENT: Negative for congestion.   Eyes: Negative for blurred vision and double vision.  Respiratory: Negative for cough, shortness  of breath, wheezing and stridor.   Cardiovascular: Negative for chest pain and leg swelling.  Gastrointestinal: Negative for abdominal pain, constipation, diarrhea, nausea and vomiting.  Genitourinary: Negative for dysuria and hematuria.  Musculoskeletal: Negative for joint pain.  Skin: Negative for itching and rash.  Neurological: Positive for dizziness, tingling, sensory change, speech change, focal weakness and headaches. Negative for tremors, seizures and loss of consciousness.  Psychiatric/Behavioral: Negative for depression. The patient is not nervous/anxious.     MEDICATIONS AT HOME:   Prior to Admission medications   Medication Sig Start Date End Date Taking? Authorizing Provider  aspirin EC 325 MG tablet Take 325 mg by mouth daily.    Historical Provider, MD  cyclobenzaprine (FLEXERIL) 10 MG tablet TAKE ONE TABLET BY MOUTH EVERY DAY AT BEDTIME AS NEEDED REPLACES FLEXERIL 05/08/16   Shannon A McGowan, PA-C  diazepam (VALIUM) 2 MG tablet Take 1 tablet (2 mg total) by mouth every 8 (eight) hours as needed for anxiety or muscle spasms. 08/24/15   [name], MD  esomeprazole (NEXIUM) 20 MG capsule Take 20 mg by mouth daily.     Historical Provider, MD  HYDROcodone-acetaminophen (NORCO/VICODIN) 5-325 MG tablet Take 1 tablet by mouth every 4 (four) hours as needed for moderate pain. 08/24/15   [name], MD  lisinopril (PRINIVIL,ZESTRIL) 5 MG tablet TAKE 1/2 TABLET (2.5MG ) BY MOUTH DAILY. 07/26/16   Lupita Leash  S Odem, FNP  metoprolol succinate (TOPROL-XL) 50 MG 24 hr tablet TAKE 1/2 TABLET (25MG ) BY MOUTH EVERY DAY. REPLACES TOPROL XL. 07/26/16   Zachery Dauer, FNP  pantoprazole (PROTONIX) 20 MG tablet Take 1 tablet (20 mg total) by mouth daily. 02/02/16   Carollee Herter A McGowan, PA-C  rosuvastatin (CRESTOR) 5 MG tablet Take 5 mg by mouth at bedtime.     Historical Provider, MD      VITAL SIGNS:  Blood pressure 139/86, pulse 79, temperature 98 F (36.7 C), temperature source Oral, resp.  rate 20, height 5\' 10"  (1.778 m), weight 170 lb (77.1 kg), SpO2 97 %.  PHYSICAL EXAMINATION:  Physical Exam  GENERAL:  55 y.o.-year-old patient lying in the bed with no acute distress.  EYES: Pupils equal, round, reactive to light and accommodation. No scleral icterus. Extraocular muscles intact.  HEENT: Head atraumatic, normocephalic. Oropharynx and nasopharynx clear.  NECK:  Supple, no jugular venous distention. No thyroid enlargement, no tenderness.  LUNGS: Normal breath sounds bilaterally, no wheezing, rales,rhonchi or crepitation. No use of accessory muscles of respiration.  CARDIOVASCULAR: S1, S2 normal. No murmurs, rubs, or gallops.  ABDOMEN: Soft, nontender, nondistended. Bowel sounds present. No organomegaly or mass.  EXTREMITIES: No pedal edema, cyanosis, or clubbing.  NEUROLOGIC: Cranial nerves II through XII are intact. Muscle strength 5/5 in all extremities except left leg 4/5. Sensation intact. Gait not checked.  PSYCHIATRIC: The patient is alert and oriented x 3.  SKIN: No obvious rash, lesion, or ulcer.   LABORATORY PANEL:   CBC  Recent Labs Lab 09/21/16 1354  WBC 9.5  HGB 14.2  HCT 40.3  PLT 243   ------------------------------------------------------------------------------------------------------------------  Chemistries   Recent Labs Lab 09/21/16 1354  NA 139  K 3.0*  CL 104  CO2 26  GLUCOSE 119*  BUN 9  CREATININE 0.66  CALCIUM 9.1   ------------------------------------------------------------------------------------------------------------------  Cardiac Enzymes  Recent Labs Lab 09/21/16 1354  TROPONINI <0.03   ------------------------------------------------------------------------------------------------------------------  RADIOLOGY:  Dg Chest 2 View  Result Date: 09/21/2016 CLINICAL DATA:  Chest tightness for the past 2 days. Left lower anterior chest swelling and pain. Recent cough. Smoker. EXAM: CHEST  2 VIEW COMPARISON:   10/09/2014. FINDINGS: Normal sized heart. Clear lungs. Mild diffuse peribronchial thickening. The lungs are mildly hyperexpanded. Coronary artery stent. Unremarkable bones. IMPRESSION: No acute abnormality.  Mild changes of COPD and chronic bronchitis. Electronically Signed   By: Beckie Salts M.D.   On: 09/21/2016 14:30   Ct Head Wo Contrast  Result Date: 09/21/2016 CLINICAL DATA:  Left-sided weakness. EXAM: CT HEAD WITHOUT CONTRAST TECHNIQUE: Contiguous axial images were obtained from the base of the skull through the vertex without intravenous contrast. COMPARISON:  11/21/2011 FINDINGS: Brain: No definite evidence of acute infarction, hemorrhage, hydrocephalus, extra-axial collection or mass lesion/mass effect. Extensive deep white matter microangiopathy, slightly asymmetric in the posterior portion of the right frontal lobe. Vascular: No hyperdense vessels. Atherosclerotic calcifications at the skullbase. Skull: Normal. Negative for fracture or focal lesion. Sinuses/Orbits: No acute finding. Other: None. IMPRESSION: Advanced microangiopathy, slightly more asymmetric in the posterior aspect of right frontal lobe. It is difficult to determine whether this represent asymmetric microangiopathy or small area of subcortical ischemic infarction. No evidence of intracranial hemorrhage. Electronically Signed   By: Ted Mcalpine M.D.   On: 09/21/2016 14:37      IMPRESSION AND PLAN:   Acute CVA The patient will be admitted to medical floor. Start neuro check, get an MRI/MRA of the brain, echocardiogram and  carotid duplex. Speech study, PT and OT evaluation. Aspirin 325 mg by mouth daily, Lipitor 40 mg at bedtime by mouth.  Hypokalemia. Give potassium supplement. Follow-up potassium and magnesium.  Hypertension. Continue Lopressor. CAD, status post stent. Continue aspirin and statin. Tobacco abuse. Smoking cessation was counseled for 3 minutes, giving patch.  All the records are reviewed and case  discussed with ED provider. Management plans discussed with the patient, family and they are in agreement.  CODE STATUS: Full code  TOTAL TIME TAKING CARE OF THIS PATIENT: 53 minutes.    Shaune Pollack M.D on 09/21/2016 at 4:20 PM  Between 7am to 6pm - Pager - [phone]  After 6pm go to [email or web address] - Scientist, research (life sciences) [facility]  Office  [phone]  CC: Primary care physician; [facility]   Note: This dictation was prepared with Dragon dictation along with smaller phrase technology. Any transcriptional errors that result from this process are unintentional.

## 2016-09-21 NOTE — ED Notes (Signed)
Family at bedside at this time

## 2016-09-21 NOTE — ED Provider Notes (Signed)
[facility] Emergency Department Provider Note    None    (approximate)  I have reviewed the triage vital signs and the nursing notes.   HISTORY  Chief Complaint Chest Pain    HPI Gary Benson is a 55 y.o. male with a history of congestive heart failure as well as COPD and previous stroke 5 years ago on aspirin presents with multiple complaints but primarily is noting left arm and leg weakness that started primarily yesterday as well as the chest tightness and some slurred speech. States that he's also been having intermittent dizziness and headache since this morning. Denies any fevers. States that the onset of symptoms was yesterday morning. States that there is been no improvement. States that he is having some difficulty with ambulating due to dizziness and left-sided weakness. He denies any midsternal chest pain or radiation to his arm or jaw.   Past Medical History:  Diagnosis Date  . CHF (congestive heart failure) (HCC)   . COPD (chronic obstructive pulmonary disease) (HCC)   . GERD (gastroesophageal reflux disease)   . Hypertension   . Myocardial infarction   . Stroke [facility](HCC)    Family History  Problem Relation Age of Onset  . Diabetes Mother   . Diabetes Sister    Past Surgical History:  Procedure Laterality Date  . CAROTID STENT    . CORONARY ANGIOPLASTY WITH STENT PLACEMENT    . HERNIA REPAIR     Patient Active Problem List   Diagnosis Date Noted  . Acute CVA (cerebrovascular accident) (HCC) 09/21/2016      Prior to Admission medications   Medication Sig Start Date End Date Taking? Authorizing Provider  aspirin EC 325 MG tablet Take 325 mg by mouth daily.    Historical Provider, MD  cyclobenzaprine (FLEXERIL) 10 MG tablet TAKE ONE TABLET BY MOUTH EVERY DAY AT BEDTIME AS NEEDED REPLACES FLEXERIL 05/08/16   Benson A McGowan, PA-C  diazepam (VALIUM) 2 MG tablet Take 1 tablet (2 mg total) by mouth every 8 (eight) hours as needed for  anxiety or muscle spasms. 08/24/15   Gary Ramusavid W Kaminski, MD  esomeprazole (NEXIUM) 20 MG capsule Take 20 mg by mouth daily.     Historical Provider, MD  HYDROcodone-acetaminophen (NORCO/VICODIN) 5-325 MG tablet Take 1 tablet by mouth every 4 (four) hours as needed for moderate pain. 08/24/15   Gary Ramusavid W Kaminski, MD  lisinopril (PRINIVIL,ZESTRIL) 5 MG tablet TAKE 1/2 TABLET (2.5MG ) BY MOUTH DAILY. 07/26/16   Gary Daueronna S Odem, FNP  metoprolol succinate (TOPROL-XL) 50 MG 24 hr tablet TAKE 1/2 TABLET (25MG ) BY MOUTH EVERY DAY. REPLACES TOPROL XL. 07/26/16   Gary Daueronna S Odem, FNP  pantoprazole (PROTONIX) 20 MG tablet Take 1 tablet (20 mg total) by mouth daily. 02/02/16   Gary HerterShannon A McGowan, PA-C  rosuvastatin (CRESTOR) 5 MG tablet Take 5 mg by mouth at bedtime.     Historical Provider, MD    Allergies Bee venom    Social History Social History  Substance Use Topics  . Smoking status: Current Every Day Smoker    Packs/day: 1.00  . Smokeless tobacco: Never Used  . Alcohol use No    Review of Systems Patient denies headaches, rhinorrhea, blurry vision, numbness, shortness of breath, chest pain, edema, cough, abdominal pain, nausea, vomiting, diarrhea, dysuria, fevers, rashes or hallucinations unless otherwise stated above in HPI. ____________________________________________   PHYSICAL EXAM:  VITAL SIGNS: Vitals:   09/21/16 1355  BP: 139/86  Pulse: 79  Resp:  20  Temp: 98 F (36.7 C)    Constitutional: Alert and oriented. Well appearing and in no acute distress. Eyes: Conjunctivae are normal. PERRL. EOMI. Head: Atraumatic. Nose: No congestion/rhinnorhea. Mouth/Throat: Mucous membranes are moist.  Oropharynx non-erythematous. Neck: No stridor. Painless ROM. No cervical spine tenderness to palpation Hematological/Lymphatic/Immunilogical: No cervical lymphadenopathy. Cardiovascular: Normal rate, regular rhythm. Grossly normal heart sounds.  Good peripheral circulation. Respiratory: Normal  respiratory effort.  No retractions. Lungs CTAB. Gastrointestinal: Soft and nontender. No distention. No abdominal bruits. No CVA tenderness.   Musculoskeletal: No lower extremity tenderness nor edema.  No joint effusions. Neurologic:  Normal speech and language. 4/5 LUE AND LLE weakness compared to right.  Subjective left sided facial tingling.  Normal FNF and heel to shin Skin:  Skin is warm, dry and intact. No rash noted. Psychiatric: Mood and affect are normal. Speech and behavior are normal.  ____________________________________________   LABS (all labs ordered are listed, but only abnormal results are displayed)  Results for orders placed or performed during the hospital encounter of 09/21/16 (from the past 24 hour(s))  Basic metabolic panel     Status: Abnormal   Collection Time: 09/21/16  1:54 PM  Result Value Ref Range   Sodium 139 135 - 145 mmol/L   Potassium 3.0 (L) 3.5 - 5.1 mmol/L   Chloride 104 101 - 111 mmol/L   CO2 26 22 - 32 mmol/L   Glucose, Bld 119 (H) 65 - 99 mg/dL   BUN 9 6 - 20 mg/dL   Creatinine, Ser 1.610.66 0.61 - 1.24 mg/dL   Calcium 9.1 8.9 - 09.610.3 mg/dL   GFR calc non Af Amer >60 >60 mL/min   GFR calc Af Amer >60 >60 mL/min   Anion gap 9 5 - 15  CBC     Status: None   Collection Time: 09/21/16  1:54 PM  Result Value Ref Range   WBC 9.5 3.8 - 10.6 K/uL   RBC 4.53 4.40 - 5.90 MIL/uL   Hemoglobin 14.2 13.0 - 18.0 g/dL   HCT 04.540.3 40.940.0 - 81.152.0 %   MCV 88.9 80.0 - 100.0 fL   MCH 31.4 26.0 - 34.0 pg   MCHC 35.3 32.0 - 36.0 g/dL   RDW 91.412.7 78.211.5 - 95.614.5 %   Platelets 243 150 - 440 K/uL  Troponin I     Status: None   Collection Time: 09/21/16  1:54 PM  Result Value Ref Range   Troponin I <0.03 <0.03 ng/mL  Protime-INR     Status: None   Collection Time: 09/21/16  1:54 PM  Result Value Ref Range   Prothrombin Time 12.4 11.4 - 15.2 seconds   INR 0.92   APTT     Status: None   Collection Time: 09/21/16  1:54 PM  Result Value Ref Range   aPTT 35 24 - 36  seconds   ____________________________________________  EKG My review and personal interpretation at Time: 13:51   Indication: chest pain  Rate: 85  Rhythm: sinus Axis: normal Other: non specific ST changes without STEMI ____________________________________________  RADIOLOGY  I personally reviewed all radiographic images ordered to evaluate for the above acute complaints and reviewed radiology reports and findings.  These findings were personally discussed with the patient.  Please see medical record for radiology report.  ____________________________________________   PROCEDURES  Procedure(s) performed: none Procedures    Critical Care performed: no ____________________________________________   INITIAL IMPRESSION / ASSESSMENT AND PLAN / ED COURSE  Pertinent labs & imaging results  that were available during my care of the patient were reviewed by me and considered in my medical decision making (see chart for details).  DDX: cva, tia, hypoglycemia, dehydration, electrolyte abnormality, dissection, sepsis   HURSCHEL PAYNTER is a 55 y.o. who presents to the ED with left-sided weakness as described above since yesterday associated with shortness of breath and chest pain. Patient does have reproducible musculoskeletal skeletal Enders palpation the left hemithorax. Chest x-ray shows COPD and brought K changes but no evidence of pneumothorax or fractures. EKG shows no evidence of acute ischemia. Initial troponin is negative. Do suspect some underlying component of COPD making his chest pain worse. CT imaging ordered due to concern for CVA shows no evidence of hemorrhage but probable worsening infarct correlating with his acute neurologic deficit. We'll order MRI to further characterize but I do feel the patient would benefit from admission for further evaluation and management of his neuro deficits.  Have discussed with the patient and available family all diagnostics and treatments  performed thus far and all questions were answered to the best of my ability. The patient demonstrates understanding and agreement with plan.   Clinical Course      ____________________________________________   FINAL CLINICAL IMPRESSION(S) / ED DIAGNOSES  Final diagnoses:  Transient cerebral ischemia, unspecified type  Left-sided weakness  Chest pain, unspecified type      NEW MEDICATIONS STARTED DURING THIS VISIT:  New Prescriptions   No medications on file     Note:  This document was prepared using Dragon voice recognition software and may include unintentional dictation errors.    Gary Eddy, MD 09/21/16 [phone]

## 2016-09-22 ENCOUNTER — Inpatient Hospital Stay: Payer: Self-pay

## 2016-09-22 ENCOUNTER — Inpatient Hospital Stay (HOSPITAL_COMMUNITY)
Admit: 2016-09-22 | Discharge: 2016-09-22 | Disposition: A | Discharge status: Home / Self Care | Payer: Self-pay | Attending: Internal Medicine | Admitting: Internal Medicine

## 2016-09-22 DIAGNOSIS — I6350 Cerebral infarction due to unspecified occlusion or stenosis of unspecified cerebral artery: Secondary | ICD-10-CM

## 2016-09-22 DIAGNOSIS — I639 Cerebral infarction, unspecified: Secondary | ICD-10-CM

## 2016-09-22 LAB — LIPID PANEL
CHOL/HDL RATIO: 5.2 ratio
CHOLESTEROL: 140 mg/dL (ref 0–200)
HDL: 27 mg/dL — AB (ref >40)
LDL Cholesterol: 83 mg/dL (ref 0–99)
TRIGLYCERIDES: 148 mg/dL (ref <150)
VLDL: 30 mg/dL (ref 0–40)

## 2016-09-22 LAB — BASIC METABOLIC PANEL
ANION GAP: 5 (ref 5–15)
BUN: 9 mg/dL (ref 6–20)
CO2: 27 mmol/L (ref 22–32)
Calcium: 8.5 mg/dL — ABNORMAL LOW (ref 8.9–10.3)
Chloride: 108 mmol/L (ref 101–111)
Creatinine, Ser: 0.48 mg/dL — ABNORMAL LOW (ref 0.61–1.24)
GFR calc Af Amer: >60 mL/min (ref >60)
GFR calc non Af Amer: >60 mL/min (ref >60)
GLUCOSE: 98 mg/dL (ref 65–99)
POTASSIUM: 3.4 mmol/L — AB (ref 3.5–5.1)
Sodium: 140 mmol/L (ref 135–145)

## 2016-09-22 LAB — ECHOCARDIOGRAM COMPLETE
Height: 70 in
Weight: 2736 oz

## 2016-09-22 MED ORDER — ASPIRIN 325 MG PO TABS: 325.0000 mg | ORAL_TABLET | Freq: Every day | ORAL | 1 refills | Status: DC

## 2016-09-22 MED ORDER — LORAZEPAM 2 MG/ML IJ SOLN
2.0000 mg | Freq: Once | INTRAMUSCULAR | Status: AC
  Administered 2016-09-22: 2 mg via INTRAVENOUS
  Filled 2016-09-22: qty 1

## 2016-09-22 MED ORDER — DIAZEPAM 5 MG PO TABS
5.0000 mg | ORAL_TABLET | Freq: Once | ORAL | Status: AC
  Administered 2016-09-22: 5 mg via ORAL
  Filled 2016-09-22: qty 1

## 2016-09-22 NOTE — Evaluation (Signed)
Physical Therapy Evaluation Patient Details Name: SEABORN CARROLL MRN: [id number] DOB: 25-Mar-1961 Today's Date: 09/22/2016   History of Present Illness  Milferd Hillis  is a 55 y.o. male with a known history of Hypertension, CAD, CVA, CHF and COPD. The patient presents to the ED with left arm and leg weakness and tingling since yesterday.    Clinical Impression  Pt presents to PT with some difficulty walking, but suspect this is due to IV valium which pt required for anxiety with MRI testing earlier today.  Pt reports feeling drowsy and a little unsteady on his feet, and as a result a RW was used for safety with ambulation.  Pt safe to return home and I suspect his gait will improve once medication cleared from his sysmptoms.  Pt continues to report heaviness in L foot during walking.  Recommend pt to monitor his gait and balance at home and f/u with MD or OPPT if symptoms persist.    Follow Up Recommendations Outpatient PT    Equipment Recommendations  Cane    Recommendations for Other Services       Precautions / Restrictions Precautions Precautions: Fall Precaution Comments: MOD Restrictions Weight Bearing Restrictions: No      Mobility  Bed Mobility Overal bed mobility: Modified Independent                Transfers Overall transfer level: Modified independent Equipment used: Rolling walker (2 wheeled)             General transfer comment: Initial unsteadiness upon standing  Ambulation/Gait Ambulation/Gait assistance: Modified independent (Device/Increase time) Ambulation Distance (Feet): 200 Feet Assistive device: Rolling walker (2 wheeled) Gait Pattern/deviations: Step-through pattern     General Gait Details: Equal step length bilaterally, able to ajust speed faster/slower wihtout difficulty; no LOB  Stairs            Wheelchair Mobility    Modified Rankin (Stroke Patients Only) Modified Rankin (Stroke Patients Only) Pre-Morbid Rankin  Score: No significant disability     Balance Overall balance assessment: Modified Independent                                           Pertinent Vitals/Pain      Home Living Family/patient expects to be discharged to:: Private residence Living Arrangements: Spouse/significant other;Children   Type of Home: House Home Access: Stairs to enter Entrance Stairs-Rails: None Entrance Stairs-Number of Steps: 3 Home Layout: One level Home Equipment: Environmental consultant - 2 wheels;Cane - single point      Prior Function Level of Independence: Independent with assistive device(s)         Comments: Takes care of diabled wife; 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     Higher education careers adviser        Extremity/Trunk Assessment   Upper Extremity Assessment Upper Extremity Assessment: Overall WFL for tasks assessed    Lower Extremity Assessment Lower Extremity Assessment: Overall WFL for tasks assessed    Cervical / Trunk Assessment Cervical / Trunk Assessment: Normal  Communication   Communication: No difficulties  Cognition Arousal/Alertness: Awake/alert Behavior During Therapy: WFL for tasks assessed/performed Overall Cognitive Status: Within Functional Limits for tasks assessed                 General Comments: Reports feeling woozy from Valium given for MRI    General Comments General comments (skin integrity, edema,  etc.): unable to SLS, but pt states he wasn't able to before    Exercises     Assessment/Plan    PT Assessment Patent does not need any further PT services  PT Problem List            PT Treatment Interventions      PT Goals (Current goals can be found in the Care Plan section)  Acute Rehab PT Goals Patient Stated Goal: To return home. PT Goal Formulation: With patient Time For Goal Achievement: 09/24/16 Potential to Achieve Goals: Good    Frequency     Barriers to discharge        Co-evaluation               End of Session  Equipment Utilized During Treatment: Gait belt Activity Tolerance: Patient tolerated treatment well Patient left: in bed;with call bell/phone within reach Nurse Communication: Mobility status         Time: 1610-1640 PT Time Calculation (min) (ACUTE ONLY): 30 min   Charges:   PT Evaluation $PT Eval Low Complexity: 1 Procedure PT Treatments $Therapeutic Activity: 8-22 mins   PT G Codes:        Hartman Minahan A Patches Mcdonnell , PT 09/22/2016, 5:02 PM

## 2016-09-22 NOTE — Progress Notes (Signed)
Pt is being discharged home. Discharged papers given and explained to pt. Pt verbalized understanding. Meds reviewed with pt. RX given. No f/u appointments at this time. Pt awaiting family to be transported.

## 2016-09-22 NOTE — Consult Note (Addendum)
Referring Physician: Dr. Imogene Burnhen     Chief Complaint: LUE and LLE weakness   HPI: Gary Benson is an 55 y.o. male  with a known history of Hypertension, CAD, CVA, CHF and COPD. The patient presents to the ED with left arm and leg weakness and tingling since day prior to admission. Symptoms with minimal improvement. Was on ASA 81 daily   Date last known well: Unable to determine Time last known well: Unable to determine tPA Given: No: out of TPA window at least 2 days of symptoms.   Past Medical History:  Diagnosis Date  . CHF (congestive heart failure) (HCC)   . COPD (chronic obstructive pulmonary disease) (HCC)   . GERD (gastroesophageal reflux disease)   . Hypertension   . Myocardial infarction   . Stroke [facility](HCC)     Past Surgical History:  Procedure Laterality Date  . CAROTID STENT    . CORONARY ANGIOPLASTY WITH STENT PLACEMENT    . HERNIA REPAIR      Family History  Problem Relation Age of Onset  . Diabetes Mother   . Diabetes Sister    Social History:  reports that he has been smoking.  He has been smoking about 1.00 pack per day. He has never used smokeless tobacco. He reports that he does not drink alcohol. His drug history is not on file.  Allergies:  Allergies  Allergen Reactions  . Bee Venom Anaphylaxis    Medications: I have reviewed the patient's current medications.  ROS: History obtained from the patient  General ROS: negative for - chills, fatigue, fever, night sweats, weight gain or weight loss Psychological ROS: negative for - behavioral disorder, hallucinations, memory difficulties, mood swings or suicidal ideation Ophthalmic ROS: negative for - blurry vision, double vision, eye pain or loss of vision ENT ROS: negative for - epistaxis, nasal discharge, oral lesions, sore throat, tinnitus or vertigo Allergy and Immunology ROS: negative for - hives or itchy/watery eyes Hematological and Lymphatic ROS: negative for - bleeding problems, bruising or  swollen lymph nodes Endocrine ROS: negative for - galactorrhea, hair pattern changes, polydipsia/polyuria or temperature intolerance Respiratory ROS: negative for - cough, hemoptysis, shortness of breath or wheezing Cardiovascular ROS: negative for - chest pain, dyspnea on exertion, edema or irregular heartbeat Gastrointestinal ROS: negative for - abdominal pain, diarrhea, hematemesis, nausea/vomiting or stool incontinence Genito-Urinary ROS: negative for - dysuria, hematuria, incontinence or urinary frequency/urgency Musculoskeletal ROS: negative for - joint swelling or muscular weakness Neurological ROS: as noted in HPI Dermatological ROS: negative for rash and skin lesion changes  Physical Examination: Blood pressure 131/76, pulse 70, temperature 97.7 F (36.5 C), temperature source Oral, resp. rate 18, height 5\' 10"  (1.778 m), weight 77.6 kg (171 lb), SpO2 99 %.  HEENT-  Normocephalic, no lesions, without obvious abnormality.  Normal external eye and conjunctiva.  Normal TM's bilaterally.  Normal auditory canals and external ears. Normal external nose, mucus membranes and septum.  Normal pharynx. Cardiovascular- regular rate and rhythm, S1, S2 normal, no murmur, click, rub or gallop, pulses palpable throughout   Lungs- Heart exam - S1, S2 normal, no murmur, no gallop, rate regular Abdomen- soft, non-tender; bowel sounds normal; no masses,  no organomegaly Extremities- less then 2 second capillary refill Lymph-no adenopathy palpable Musculoskeletal-no joint tenderness, deformity or swelling Skin-warm and dry, no hyperpigmentation, vitiligo, or suspicious lesions  Neurological Examination Mental Status: Alert, oriented, thought content appropriate.  Speech fluent without evidence of aphasia.  Able to follow 3 step commands without  difficulty. Cranial Nerves: II: Discs flat bilaterally; Visual fields grossly normal, pupils equal, round, reactive to light and accommodation III,IV, VI:  ptosis not present, extra-ocular motions intact bilaterally V,VII: smile symmetric, facial light touch sensation normal bilaterally VIII: hearing normal bilaterally IX,X: gag reflex present XI: bilateral shoulder shrug XII: midline tongue extension Motor: Right : Upper extremity   5/5    Left:     Upper extremity   4+/5  Lower extremity   5/5     Lower extremity   4+/5 Tone and bulk:normal tone throughout; no atrophy noted Sensory: Pinprick decreased L side  Deep Tendon Reflexes: 2+ and symmetric throughout Plantars: Right: downgoing   Left: downgoing Cerebellar: normal finger-to-nose, normal rapid alternating movements and normal heel-to-shin test Gait: normal gait and station      Laboratory Studies:  Basic Metabolic Panel:  Recent Labs Lab 09/21/16 1354 09/22/16 0430  NA 139 140  K 3.0* 3.4*  CL 104 108  CO2 26 27  GLUCOSE 119* 98  BUN 9 9  CREATININE 0.66 0.48*  CALCIUM 9.1 8.5*  MG 1.7  --     Liver Function Tests: No results for input(s): AST, ALT, ALKPHOS, BILITOT, PROT, ALBUMIN in the last 168 hours. No results for input(s): LIPASE, AMYLASE in the last 168 hours. No results for input(s): AMMONIA in the last 168 hours.  CBC:  Recent Labs Lab 09/21/16 1354  WBC 9.5  HGB 14.2  HCT 40.3  MCV 88.9  PLT 243    Cardiac Enzymes:  Recent Labs Lab 09/21/16 1354  TROPONINI <0.03    BNP: Invalid input(s): POCBNP  CBG: No results for input(s): GLUCAP in the last 168 hours.  Microbiology: No results found for this or any previous visit.  Coagulation Studies:  Recent Labs  09/21/16 1354  LABPROT 12.4  INR 0.92    Urinalysis: No results for input(s): COLORURINE, LABSPEC, PHURINE, GLUCOSEU, HGBUR, BILIRUBINUR, KETONESUR, PROTEINUR, UROBILINOGEN, NITRITE, LEUKOCYTESUR in the last 168 hours.  Invalid input(s): APPERANCEUR  Lipid Panel:    Component Value Date/Time   CHOL 140 09/22/2016 0430   CHOL 119 11/21/2011 0653   TRIG 148  09/22/2016 0430   TRIG 122 11/21/2011 0653   HDL 27 (L) 09/22/2016 0430   HDL 29 (L) 11/21/2011 0653   CHOLHDL 5.2 09/22/2016 0430   VLDL 30 09/22/2016 0430   VLDL 24 11/21/2011 0653   LDLCALC 83 09/22/2016 0430   LDLCALC 66 11/21/2011 0653    HgbA1C:  Lab Results  Component Value Date   HGBA1C 5.9 08/16/2015    Urine Drug Screen:  No results found for: LABOPIA, COCAINSCRNUR, LABBENZ, AMPHETMU, THCU, LABBARB  Alcohol Level: No results for input(s): ETH in the last 168 hours.  Other results: EKG: normal EKG, normal sinus rhythm, unchanged from previous tracings.  Imaging: Dg Chest 2 View  Result Date: 09/21/2016 CLINICAL DATA:  Chest tightness for the past 2 days. Left lower anterior chest swelling and pain. Recent cough. Smoker. EXAM: CHEST  2 VIEW COMPARISON:  10/09/2014. FINDINGS: Normal sized heart. Clear lungs. Mild diffuse peribronchial thickening. The lungs are mildly hyperexpanded. Coronary artery stent. Unremarkable bones. IMPRESSION: No acute abnormality.  Mild changes of COPD and chronic bronchitis. Electronically Signed   By: Beckie Salts M.D.   On: 09/21/2016 14:30   Ct Head Wo Contrast  Result Date: 09/21/2016 CLINICAL DATA:  Left-sided weakness. EXAM: CT HEAD WITHOUT CONTRAST TECHNIQUE: Contiguous axial images were obtained from the base of the skull through the vertex without  intravenous contrast. COMPARISON:  11/21/2011 FINDINGS: Brain: No definite evidence of acute infarction, hemorrhage, hydrocephalus, extra-axial collection or mass lesion/mass effect. Extensive deep white matter microangiopathy, slightly asymmetric in the posterior portion of the right frontal lobe. Vascular: No hyperdense vessels. Atherosclerotic calcifications at the skullbase. Skull: Normal. Negative for fracture or focal lesion. Sinuses/Orbits: No acute finding. Other: None. IMPRESSION: Advanced microangiopathy, slightly more asymmetric in the posterior aspect of right frontal lobe. It is  difficult to determine whether this represent asymmetric microangiopathy or small area of subcortical ischemic infarction. No evidence of intracranial hemorrhage. Electronically Signed   By: Ted Mcalpineobrinka  Dimitrova M.D.   On: 09/21/2016 14:37    Assessment: 55 y.o. male  with a known history of Hypertension, CAD, CVA, CHF and COPD. The patient presents to the ED with left arm and leg weakness and tingling since day prior to admission. Symptoms with minimal improvement. Was on ASA 81 daily    Stroke Risk Factors - family history, hypertension and smoking  Plan: 1. HgbA1c, fasting lipid panel 2. MRI, MRA  of the brain without contrast 3. PT consult, OT consult, Speech consult 4. Echocardiogram 5. Carotid dopplers 6. Prophylactic therapy-Antiplatelet med: Aspirin - dose 325 7. NPO until RN stroke swallow screen 8. Telemetry monitoring 9. Frequent neuro checks 10. Possible d/c planning today or tomorrow   Pauletta BrownsZEYLIKMAN, Japneet Staggs  09/22/2016, 1:10 PM

## 2016-09-22 NOTE — Progress Notes (Signed)
*  PRELIMINARY RESULTS* Echocardiogram 2D Echocardiogram has been performed.  Gary Benson 09/22/2016, 9:48 AM

## 2016-09-23 LAB — HEMOGLOBIN A1C
HEMOGLOBIN A1C: 5.7 % — AB (ref 4.8–5.6)
Mean Plasma Glucose: 117 mg/dL

## 2016-09-23 NOTE — Discharge Summary (Signed)
[employer] - [place] at [facility]   PATIENT NAME: Gary Benson    MR#:  [id number]  DATE OF BIRTH:  1961/10/07  DATE OF ADMISSION:  09/21/2016 ADMITTING PHYSICIAN: Shaune Pollack, MD  DATE OF DISCHARGE: 09/22/2016  6:45 PM  PRIMARY CARE PHYSICIAN: [facility]    ADMISSION DIAGNOSIS:  Left-sided weakness [R53.1] Transient cerebral ischemia, unspecified type [G45.9] Chest pain, unspecified type [R07.9]  DISCHARGE DIAGNOSIS:  Active Problems:   Acute CVA (cerebrovascular accident) (HCC)   SECONDARY DIAGNOSIS:   Past Medical History:  Diagnosis Date  . CHF (congestive heart failure) (HCC)   . COPD (chronic obstructive pulmonary disease) (HCC)   . GERD (gastroesophageal reflux disease)   . Hypertension   . Myocardial infarction   . Stroke [facility])     HOSPITAL COURSE:   55 year old male with past medical history of COPD, GERD, hypertension, previous history of MI, previous history of CVA, CHF who presented to the hospital due to left upper extremity weakness and numbness.   1. TIA-this is the cause of patient's left upper extremity weakness and numbness. Patient had some transient symptoms which had improved prior to discharge.  -patient was admitted to the hospital underwent extensive workup including CT head, MRI MRA of the brain. The MRI and MRA did not show any acute abnormalities or any evidence of stroke. -His clinical symptoms have improved and therefore he is being discharged on a full aspirin and several baby aspirin. Neurology followed the patient with agreed with this management. -Patient will continue his Crestor. Patient was seen by physical therapy, speech and did not require any further services.   2. COPD-no acute exacerbation on the hospital.   3. GERD-patient will continue his Protonix.  4. Essential hypertension-patient will resume his lisinopril.   5. HyperLipidemia-patient will resume his Crestor.    DISCHARGE  CONDITIONS:   Stable  CONSULTS OBTAINED:    DRUG ALLERGIES:   Allergies  Allergen Reactions  . Bee Venom Anaphylaxis    DISCHARGE MEDICATIONS:   Allergies as of 09/22/2016      Reactions   Bee Venom Anaphylaxis      Medication List    STOP taking these medications   aspirin EC 81 MG tablet Replaced by:  aspirin 325 MG tablet     TAKE these medications   acetaminophen 500 MG tablet Commonly known as:  TYLENOL Take 1,000 mg by mouth every morning.   aspirin 325 MG tablet Take 1 tablet (325 mg total) by mouth daily. Replaces:  aspirin EC 81 MG tablet   cyclobenzaprine 10 MG tablet Commonly known as:  FLEXERIL TAKE ONE TABLET BY MOUTH EVERY DAY AT BEDTIME AS NEEDED REPLACES FLEXERIL   diazepam 2 MG tablet Commonly known as:  VALIUM Take 1 tablet (2 mg total) by mouth every 8 (eight) hours as needed for anxiety or muscle spasms.   diphenhydramine-acetaminophen 25-500 MG Tabs tablet Commonly known as:  TYLENOL PM Take 1 tablet by mouth at bedtime as needed.   HYDROcodone-acetaminophen 5-325 MG tablet Commonly known as:  NORCO/VICODIN Take 1 tablet by mouth every 4 (four) hours as needed for moderate pain.   lisinopril 5 MG tablet Commonly known as:  PRINIVIL,ZESTRIL TAKE 1/2 TABLET (2.5MG ) BY MOUTH DAILY. What changed:  how much to take  how to take this  when to take this  additional instructions   metoprolol succinate 50 MG 24 hr tablet Commonly known as:  TOPROL-XL TAKE 1/2 TABLET (25MG ) BY MOUTH EVERY DAY.  REPLACES TOPROL XL. What changed:  how much to take  how to take this  when to take this  additional instructions   pantoprazole 20 MG tablet Commonly known as:  PROTONIX Take 1 tablet (20 mg total) by mouth daily.   rosuvastatin 5 MG tablet Commonly known as:  CRESTOR Take 5 mg by mouth at bedtime.         DISCHARGE INSTRUCTIONS:   DIET:  Cardiac diet  DISCHARGE CONDITION:  Stable  ACTIVITY:  Activity as  tolerated  OXYGEN:  Home Oxygen: No.   Oxygen Delivery: room air  DISCHARGE LOCATION:  home   If you experience worsening of your admission symptoms, develop shortness of breath, life threatening emergency, suicidal or homicidal thoughts you must seek medical attention immediately by calling 911 or calling your MD immediately  if symptoms less severe.  You Must read complete instructions/literature along with all the possible adverse reactions/side effects for all the Medicines you take and that have been prescribed to you. Take any new Medicines after you have completely understood and accpet all the possible adverse reactions/side effects.   Please note  You were cared for by a hospitalist during your hospital stay. If you have any questions about your discharge medications or the care you received while you were in the hospital after you are discharged, you can call the unit and asked to speak with the hospitalist on call if the hospitalist that took care of you is not available. Once you are discharged, your primary care physician will handle any further medical issues. Please note that NO REFILLS for any discharge medications will be authorized once you are discharged, as it is imperative that you return to your primary care physician (or establish a relationship with a primary care physician if you do not have one) for your aftercare needs so that they can reassess your need for medications and monitor your lab values.     Today   Left sided weakness/numbness much improved. No headache and no other complaints.   VITAL SIGNS:  Blood pressure 138/80, pulse 81, temperature 97.7 F (36.5 C), temperature source Oral, resp. rate 20, height 5\' 10"  (1.778 m), weight 77.6 kg (171 lb), SpO2 97 %.  I/O:   Intake/Output Summary (Last 24 hours) at 09/23/16 1620 Last data filed at 09/22/16 1700  Gross per 24 hour  Intake              120 ml  Output                0 ml  Net               120 ml    PHYSICAL EXAMINATION:  GENERAL:  55 y.o.-year-old patient lying in the bed with no acute distress.  EYES: Pupils equal, round, reactive to light and accommodation. No scleral icterus. Extraocular muscles intact.  HEENT: Head atraumatic, normocephalic. Oropharynx and nasopharynx clear.  NECK:  Supple, no jugular venous distention. No thyroid enlargement, no tenderness.  LUNGS: Normal breath sounds bilaterally, no wheezing, rales,rhonchi. No use of accessory muscles of respiration.  CARDIOVASCULAR: S1, S2 normal. No murmurs, rubs, or gallops.  ABDOMEN: Soft, non-tender, non-distended. Bowel sounds present. No organomegaly or mass.  EXTREMITIES: No pedal edema, cyanosis, or clubbing.  NEUROLOGIC: Cranial nerves II through XII are intact. No focal motor or sensory defecits b/l. 4/5 Strength on Left upper and Lower ext. Compared to right.  PSYCHIATRIC: The patient is alert and oriented x 3.  SKIN: No obvious rash, lesion, or ulcer.   DATA REVIEW:   CBC  Recent Labs Lab 09/21/16 1354  WBC 9.5  HGB 14.2  HCT 40.3  PLT 243    Chemistries   Recent Labs Lab 09/21/16 1354 09/22/16 0430  NA 139 140  K 3.0* 3.4*  CL 104 108  CO2 26 27  GLUCOSE 119* 98  BUN 9 9  CREATININE 0.66 0.48*  CALCIUM 9.1 8.5*  MG 1.7  --     Cardiac Enzymes  Recent Labs Lab 09/21/16 1354  TROPONINI <0.03    Microbiology Results  No results found for this or any previous visit.  RADIOLOGY:  Mr Brain Wo Contrast  Result Date: 09/22/2016 CLINICAL DATA:  Left arm and leg weakness and tingling. Headache and dizziness. EXAM: MRI HEAD WITHOUT CONTRAST MRA HEAD WITHOUT CONTRAST TECHNIQUE: Multiplanar, multiecho pulse sequences of the brain and surrounding structures were obtained without intravenous contrast. Angiographic images of the head were obtained using MRA technique without contrast. COMPARISON:  Head CT 09/21/2016 and MRI 11/21/2011 FINDINGS: MRI HEAD FINDINGS Brain: There is no  evidence of acute infarct, intracranial hemorrhage, mass, midline shift, or extra-axial fluid collection. The ventricles and sulci are normal for age. Small foci of T2 hyperintensity in the subcortical and deep cerebral white matter bilaterally have progressed from 2013 and are nonspecific but compatible with chronic small vessel ischemic disease, mildly to moderately advanced for age. Wallerian degeneration is again seen in the right corticospinal tract at the level of the brainstem and lower internal capsule. There are chronic lacunar infarcts in the corona radiata bilaterally, unchanged from 2013. Vascular: Major intracranial vascular flow voids are preserved. Skull and upper cervical spine: Unremarkable bone marrow signal. Sinuses/Orbits: Unremarkable orbits. Mild mucosal thickening in the paranasal sinuses, greatest in the ethmoids. Other: None. MRA HEAD FINDINGS The visualized distal vertebral arteries are widely patent to the basilar with the right being slightly dominant. AICAs and SCAs are patent proximally. Basilar artery is widely patent. PCAs are patent without evidence of significant proximal stenosis. The internal carotid arteries are widely patent from skullbase to carotid termini. ACAs and MCAs are patent without evidence of major branch occlusion or significant proximal stenosis. No intracranial aneurysm is identified. IMPRESSION: 1. No acute intracranial abnormality. 2. Chronic small vessel ischemic disease, mildly to moderately advanced for age and progressed from 2013. 3. Negative head MRA. Electronically Signed   By: Sebastian Ache M.D.   On: 09/22/2016 16:18   US Carotid Bilateral (at [facility])  Result Date: 09/23/2016 CLINICAL DATA:  Stroke. EXAM: BILATERAL CAROTID DUPLEX ULTRASOUND TECHNIQUE: Wallace Cullens scale imaging, color Doppler and duplex ultrasound were performed of bilateral carotid and vertebral arteries in the neck. COMPARISON:  CT 09/21/2016.  Ultrasound 12/24/2010 FINDINGS:  Criteria: Quantification of carotid stenosis is based on velocity parameters that correlate the residual internal carotid diameter with NASCET-based stenosis levels, using the diameter of the distal internal carotid lumen as the denominator for stenosis measurement. The following velocity measurements were obtained: RIGHT ICA:  97/30 cm/sec CCA:  131/33 cm/sec SYSTOLIC ICA/CCA RATIO:  0.7 DIASTOLIC ICA/CCA RATIO:  0.9 ECA:  102 cm/sec LEFT ICA:  117/20 cm/sec CCA:  160/27 cm/sec SYSTOLIC ICA/CCA RATIO:  0.7 DIASTOLIC ICA/CCA RATIO:  0.7 ECA:  93 cm/sec RIGHT CAROTID ARTERY: Mild right carotid bifurcation atherosclerotic vascular plaque. Slight progression from prior exam. No flow limiting stenosis. RIGHT VERTEBRAL ARTERY:  Patent with antegrade flow. LEFT CAROTID ARTERY: Mild left carotid bifurcation atherosclerotic vascular  plaque. Slight progression from prior exam. No flow limiting stenosis. LEFT VERTEBRAL ARTERY:  Patent with antegrade flow. IMPRESSION: 1. 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 Slight progression from prior exam. No flow limiting stenosis. Degree of stenosis less than 50% bilaterally. 2. Vertebral arteries are patent with antegrade flow. Electronically Signed   By: Maisie Fus  Register   On: 09/23/2016 06:52   Mr Maxine Glenn Head/brain Wo Cm  Result Date: 09/22/2016 CLINICAL DATA:  Left arm and leg weakness and tingling. Headache and dizziness. EXAM: MRI HEAD WITHOUT CONTRAST MRA HEAD WITHOUT CONTRAST TECHNIQUE: Multiplanar, multiecho pulse sequences of the brain and surrounding structures were obtained without intravenous contrast. Angiographic images of the head were obtained using MRA technique without contrast. COMPARISON:  Head CT 09/21/2016 and MRI 11/21/2011 FINDINGS: MRI HEAD FINDINGS Brain: There is no evidence of acute infarct, intracranial hemorrhage, mass, midline shift, or extra-axial fluid collection. The ventricles and sulci are normal for age. Small foci of  T2 hyperintensity in the subcortical and deep cerebral white matter bilaterally have progressed from 2013 and are nonspecific but compatible with chronic small vessel ischemic disease, mildly to moderately advanced for age. Wallerian degeneration is again seen in the right corticospinal tract at the level of the brainstem and lower internal capsule. There are chronic lacunar infarcts in the corona radiata bilaterally, unchanged from 2013. Vascular: Major intracranial vascular flow voids are preserved. Skull and upper cervical spine: Unremarkable bone marrow signal. Sinuses/Orbits: Unremarkable orbits. Mild mucosal thickening in the paranasal sinuses, greatest in the ethmoids. Other: None. MRA HEAD FINDINGS The visualized distal vertebral arteries are widely patent to the basilar with the right being slightly dominant. AICAs and SCAs are patent proximally. Basilar artery is widely patent. PCAs are patent without evidence of significant proximal stenosis. The internal carotid arteries are widely patent from skullbase to carotid termini. ACAs and MCAs are patent without evidence of major branch occlusion or significant proximal stenosis. No intracranial aneurysm is identified. IMPRESSION: 1. No acute intracranial abnormality. 2. Chronic small vessel ischemic disease, mildly to moderately advanced for age and progressed from 2013. 3. Negative head MRA. Electronically Signed   By: Sebastian Ache M.D.   On: 09/22/2016 16:18      Management plans discussed with the patient, family and they are in agreement.  CODE STATUS:  Code Status History    Date Active Date Inactive Code Status Order ID Comments User Context   09/21/2016  7:54 PM 09/22/2016 10:01 PM Full Code [id number]  Shaune Pollack, MD Inpatient      TOTAL TIME TAKING CARE OF THIS PATIENT: 40 minutes.    Houston Siren M.D on 09/23/2016 at 4:20 PM  Between 7am to 6pm - Pager - [phone]  After 6pm go to [email or web address] - Museum/gallery curator Hospitalists  Office  [phone]  CC: Primary care physician; [facility]

## 2016-10-12 ENCOUNTER — Emergency Department: Payer: Self-pay

## 2016-10-12 ENCOUNTER — Encounter: Payer: Self-pay | Admitting: Emergency Medicine

## 2016-10-12 ENCOUNTER — Emergency Department
Admission: EM | Admit: 2016-10-12 | Discharge: 2016-10-12 | Disposition: A | Discharge status: Home / Self Care | Payer: Self-pay | Attending: Emergency Medicine | Admitting: Emergency Medicine

## 2016-10-12 DIAGNOSIS — I110 Hypertensive heart disease with heart failure: Secondary | ICD-10-CM | POA: Insufficient documentation

## 2016-10-12 DIAGNOSIS — I509 Heart failure, unspecified: Secondary | ICD-10-CM | POA: Insufficient documentation

## 2016-10-12 DIAGNOSIS — F17200 Nicotine dependence, unspecified, uncomplicated: Secondary | ICD-10-CM | POA: Insufficient documentation

## 2016-10-12 DIAGNOSIS — R202 Paresthesia of skin: Secondary | ICD-10-CM | POA: Insufficient documentation

## 2016-10-12 DIAGNOSIS — Z7982 Long term (current) use of aspirin: Secondary | ICD-10-CM | POA: Insufficient documentation

## 2016-10-12 DIAGNOSIS — J449 Chronic obstructive pulmonary disease, unspecified: Secondary | ICD-10-CM | POA: Insufficient documentation

## 2016-10-12 DIAGNOSIS — R531 Weakness: Secondary | ICD-10-CM | POA: Insufficient documentation

## 2016-10-12 DIAGNOSIS — Z955 Presence of coronary angioplasty implant and graft: Secondary | ICD-10-CM | POA: Insufficient documentation

## 2016-10-12 DIAGNOSIS — R29898 Other symptoms and signs involving the musculoskeletal system: Secondary | ICD-10-CM

## 2016-10-12 DIAGNOSIS — Z79899 Other long term (current) drug therapy: Secondary | ICD-10-CM | POA: Insufficient documentation

## 2016-10-12 LAB — COMPREHENSIVE METABOLIC PANEL
ALBUMIN: 4.4 g/dL (ref 3.5–5.0)
ALK PHOS: 63 U/L (ref 38–126)
ALT: 28 U/L (ref 17–63)
AST: 29 U/L (ref 15–41)
Anion gap: 9 (ref 5–15)
BUN: 9 mg/dL (ref 6–20)
CALCIUM: 9.3 mg/dL (ref 8.9–10.3)
CO2: 27 mmol/L (ref 22–32)
CREATININE: 0.62 mg/dL (ref 0.61–1.24)
Chloride: 102 mmol/L (ref 101–111)
GFR calc Af Amer: >60 mL/min (ref >60)
GFR calc non Af Amer: >60 mL/min (ref >60)
GLUCOSE: 122 mg/dL — AB (ref 65–99)
Potassium: 3 mmol/L — ABNORMAL LOW (ref 3.5–5.1)
SODIUM: 138 mmol/L (ref 135–145)
Total Bilirubin: 0.8 mg/dL (ref 0.3–1.2)
Total Protein: 8 g/dL (ref 6.5–8.1)

## 2016-10-12 LAB — GLUCOSE, CAPILLARY: Glucose-Capillary: 127 mg/dL — ABNORMAL HIGH (ref 65–99)

## 2016-10-12 LAB — DIFFERENTIAL
Basophils Absolute: 0 10*3/uL (ref 0–0.1)
Basophils Relative: 1 %
Eosinophils Absolute: 0.3 10*3/uL (ref 0–0.7)
Eosinophils Relative: 4 %
LYMPHS PCT: 30 %
Lymphs Abs: 2.6 10*3/uL (ref 1.0–3.6)
Monocytes Absolute: 0.6 10*3/uL (ref 0.2–1.0)
Monocytes Relative: 6 %
NEUTROS ABS: 5.3 10*3/uL (ref 1.4–6.5)
NEUTROS PCT: 59 %

## 2016-10-12 LAB — CBC
HCT: 42.6 % (ref 40.0–52.0)
Hemoglobin: 14.8 g/dL (ref 13.0–18.0)
MCH: 31.3 pg (ref 26.0–34.0)
MCHC: 34.8 g/dL (ref 32.0–36.0)
MCV: 90.1 fL (ref 80.0–100.0)
Platelets: 290 10*3/uL (ref 150–440)
RBC: 4.72 MIL/uL (ref 4.40–5.90)
RDW: 12.9 % (ref 11.5–14.5)
WBC: 8.9 10*3/uL (ref 3.8–10.6)

## 2016-10-12 LAB — TROPONIN I: Troponin I: <0.03 ng/mL (ref <0.03)

## 2016-10-12 LAB — PROTIME-INR
INR: 0.97
PROTHROMBIN TIME: 12.9 s (ref 11.4–15.2)

## 2016-10-12 LAB — APTT: aPTT: 38 seconds — ABNORMAL HIGH (ref 24–36)

## 2016-10-12 MED ORDER — LORAZEPAM 2 MG/ML IJ SOLN
2.0000 mg | Freq: Once | INTRAMUSCULAR | Status: AC
  Administered 2016-10-12: 2 mg via INTRAVENOUS
  Filled 2016-10-12: qty 1

## 2016-10-12 NOTE — ED Provider Notes (Signed)
[facility] Emergency Department Provider Note  ____________________________________________  Time seen: Approximately 8:06 PM  I have reviewed the triage vital signs and the nursing notes.   HISTORY  Chief Complaint Numbness (RLE)    HPI Gary Benson is a 56 y.o. male who complains of right leg weakness for the past week. He feels like he can't point to his toes upward or lift the foot. Feels like it has a hard time moving his foot off of the gas or brake pedals of his car. As fever chills or back pain or any trauma. No headache. No vision changes. Does have some numbness in the right leg as well below the knee.  Was recently in the hospital within the past month, had a full stroke workup which was unremarkable. Taking aspirin daily. Reports a history of left-sided stroke.   Past Medical History:  Diagnosis Date  . CHF (congestive heart failure) (HCC)   . COPD (chronic obstructive pulmonary disease) (HCC)   . GERD (gastroesophageal reflux disease)   . Hypertension   . Myocardial infarction   . Stroke [facility])      Patient Active Problem List   Diagnosis Date Noted  . Acute CVA (cerebrovascular accident) (HCC) 09/21/2016     Past Surgical History:  Procedure Laterality Date  . CAROTID STENT    . CORONARY ANGIOPLASTY WITH STENT PLACEMENT    . HERNIA REPAIR       Prior to Admission medications   Medication Sig Start Date End Date Taking? Authorizing Provider  acetaminophen (TYLENOL) 500 MG tablet Take 1,000 mg by mouth every morning.   Yes Historical Provider, MD  aspirin 325 MG tablet Take 1 tablet (325 mg total) by mouth daily. 09/23/16  Yes Houston Siren, MD  cyclobenzaprine (FLEXERIL) 10 MG tablet TAKE ONE TABLET BY MOUTH EVERY DAY AT BEDTIME AS NEEDED REPLACES FLEXERIL 05/08/16  Yes Shannon A McGowan, PA-C  diphenhydramine-acetaminophen (TYLENOL PM) 25-500 MG TABS tablet Take 1 tablet by mouth at bedtime as needed.   Yes Historical  Provider, MD  lisinopril (PRINIVIL,ZESTRIL) 5 MG tablet TAKE 1/2 TABLET (2.5MG ) BY MOUTH DAILY. Patient taking differently: Take 2.5 mg by mouth daily. TAKE 1/2 TABLET (2.5MG ) BY MOUTH DAILY. 07/26/16  Yes Zachery Dauer, FNP  metoprolol succinate (TOPROL-XL) 50 MG 24 hr tablet TAKE 1/2 TABLET (25MG ) BY MOUTH EVERY DAY. REPLACES TOPROL XL. Patient taking differently: Take 25 mg by mouth daily. TAKE 1/2 TABLET (25MG ) BY MOUTH EVERY DAY. REPLACES TOPROL XL. 07/26/16  Yes Zachery Dauer, FNP  pantoprazole (PROTONIX) 20 MG tablet Take 1 tablet (20 mg total) by mouth daily. 02/02/16  Yes Shannon A McGowan, PA-C  diazepam (VALIUM) 2 MG tablet Take 1 tablet (2 mg total) by mouth every 8 (eight) hours as needed for anxiety or muscle spasms. Patient not taking: Reported on 10/12/2016 08/24/15   [name], MD     Allergies Bee venom   Family History  Problem Relation Age of Onset  . Diabetes Mother   . Diabetes Sister     Social History Social History  Substance Use Topics  . Smoking status: Current Every Day Smoker    Packs/day: 1.00  . Smokeless tobacco: Never Used  . Alcohol use No    Review of Systems  Constitutional:   No fever or chills.  ENT:   No sore throat. No rhinorrhea. Cardiovascular:   No chest pain. Respiratory:   No dyspnea or cough. Gastrointestinal:   Negative for  abdominal pain, vomiting and diarrhea.  Genitourinary:   Negative for dysuria or difficulty urinating. Musculoskeletal:   Negative for focal pain or swelling Neurological:   Negative for headaches, Positive for weakness and paresthesia in the right leg 10-point ROS otherwise negative.  ____________________________________________   PHYSICAL EXAM:  VITAL SIGNS: ED Triage Vitals  Enc Vitals Group     BP 10/12/16 1401 (!) 141/84     Pulse Rate 10/12/16 1401 81     Resp 10/12/16 1401 20     Temp 10/12/16 1401 97.8 F (36.6 C)     Temp Source 10/12/16 1401 Oral     SpO2 10/12/16 1401 100 %      Weight 10/12/16 1419 171 lb (77.6 kg)     Height 10/12/16 1419 5\' 10"  (1.778 m)     Head Circumference --      Peak Flow --      Pain Score 10/12/16 1419 0     Pain Loc --      Pain Edu? --      Excl. in GC? --     Vital signs reviewed, nursing assessments reviewed.   Constitutional:   Alert and oriented. Well appearing and in no distress. Eyes:   No scleral icterus. No conjunctival pallor. PERRL. EOMI.  No nystagmus. ENT   Head:   Normocephalic and atraumatic.   Nose:   No congestion/rhinnorhea. No septal hematoma   Mouth/Throat:   MMM, no pharyngeal erythema. No peritonsillar mass.    Neck:   No stridor. No SubQ emphysema. No meningismus. Hematological/Lymphatic/Immunilogical:   No cervical lymphadenopathy. Cardiovascular:   RRR. Symmetric bilateral radial and DP pulses.  No murmurs.  Respiratory:   Normal respiratory effort without tachypnea nor retractions. Breath sounds are clear and equal bilaterally. No wheezes/rales/rhonchi. Gastrointestinal:   Soft and nontender. Non distended. There is no CVA tenderness.  No rebound, rigidity, or guarding. Genitourinary:   deferred Musculoskeletal:   Nontender with normal range of motion in all extremities. No joint effusions.  No lower extremity tenderness.  No edema. Neurologic:   Normal speech and language.  CN 2-10 normal. 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 Motor function otherwise intact. Sensory deficit to sharp stimulus diffusely in the right leg below the knee, starting at the level of the tibial tuberosity. Unremarkable gait. Cerebellar testing in upper and lower extremity is unremarkable. NIH stroke scale 2 No gross focal neurologic deficits are appreciated.  Skin:    Skin is warm, dry and intact. No rash noted.  No petechiae, purpura, or  bullae.  ____________________________________________    LABS (pertinent positives/negatives) (all labs ordered are listed, but only abnormal results are displayed) Labs Reviewed  APTT - Abnormal; Notable for the following:       Result Value   aPTT 38 (*)    All other components within normal limits  COMPREHENSIVE METABOLIC PANEL - Abnormal; Notable for the following:    Potassium 3.0 (*)    Glucose, Bld 122 (*)    All other components within normal limits  GLUCOSE, CAPILLARY - Abnormal; Notable for the following:    Glucose-Capillary 127 (*)    All other components within normal limits  PROTIME-INR  CBC  DIFFERENTIAL  TROPONIN I  CBG MONITORING, ED   ____________________________________________   EKG  Interpreted by me Normal sinus rhythm rate of 80, normal axis and intervals.  Normal QRS ST segments. T wave inversions in 3 and aVF. These appear to be new compared to December 2017  ____________________________________________    RADIOLOGY  CT brain unremarkable MRI brain unremarkable  ____________________________________________   PROCEDURES Procedures  ____________________________________________   INITIAL IMPRESSION / ASSESSMENT AND PLAN / ED COURSE  Pertinent labs & imaging results that were available during my care of the patient were reviewed by me and considered in my medical decision making (see chart for details).  Patient well appearing no acute distress, presents with neurologic deficits in the right leg concerning for acute stroke. He recently had a stroke workup which was entirely negative for any risk factor for stroke. Given the negative CT head and all that reassuring workup so far, we obtained an MRI in the ED today which was negative as well. Without any risk factor for stroke or identifiable stroke on imaging, this appears to be a peripheral phenomenon. No symptoms or signs or red flags for cauda equina or other lower CNS disease. I'll have  the patient continue taking aspirin and follow up with primary care and neurology.  EKG shows new inferior T-wave inversions. This is nonspecific given the patient has no cardiopulmonary symptoms at all. A screening troponin was negative. No further workup is indicated at this time.     Clinical Course    ____________________________________________   FINAL CLINICAL IMPRESSION(S) / ED DIAGNOSES  Final diagnoses:  Right leg weakness  Paresthesia      New Prescriptions   No medications on file     Portions of this note were generated with dragon dictation software. Dictation errors may occur despite best attempts at proofreading.    Sharman CheekPhillip Beya Tipps, MD 10/12/16 2012

## 2016-10-12 NOTE — ED Triage Notes (Signed)
Pt c/o numbness/tingling to RLE below knee starting this past Monday. Pt states he is unable to flex his foot upward as normal. Recently admitted for CVA with slight L-sided residual to L hand. No slurred speech, no facial droop noted.

## 2016-10-12 NOTE — ED Notes (Signed)
Patient transported to MRI 

## 2016-10-12 NOTE — Discharge Instructions (Signed)
Your CT scan and MRI scan of the brain were unremarkable today. Continue following up with your doctor and neurology for further evaluation of your symptoms.

## 2016-10-12 NOTE — ED Notes (Signed)
Patient transported to CT 

## 2016-10-14 ENCOUNTER — Emergency Department: Payer: Self-pay

## 2016-10-14 ENCOUNTER — Emergency Department
Admission: EM | Admit: 2016-10-14 | Discharge: 2016-10-14 | Disposition: A | Discharge status: Home / Self Care | Payer: Self-pay | Attending: Emergency Medicine | Admitting: Emergency Medicine

## 2016-10-14 ENCOUNTER — Encounter: Payer: Self-pay | Admitting: Emergency Medicine

## 2016-10-14 DIAGNOSIS — F17200 Nicotine dependence, unspecified, uncomplicated: Secondary | ICD-10-CM | POA: Insufficient documentation

## 2016-10-14 DIAGNOSIS — Z79899 Other long term (current) drug therapy: Secondary | ICD-10-CM | POA: Insufficient documentation

## 2016-10-14 DIAGNOSIS — I110 Hypertensive heart disease with heart failure: Secondary | ICD-10-CM | POA: Insufficient documentation

## 2016-10-14 DIAGNOSIS — M5416 Radiculopathy, lumbar region: Secondary | ICD-10-CM

## 2016-10-14 DIAGNOSIS — R079 Chest pain, unspecified: Secondary | ICD-10-CM | POA: Insufficient documentation

## 2016-10-14 DIAGNOSIS — R202 Paresthesia of skin: Secondary | ICD-10-CM | POA: Insufficient documentation

## 2016-10-14 DIAGNOSIS — M21372 Foot drop, left foot: Secondary | ICD-10-CM | POA: Insufficient documentation

## 2016-10-14 DIAGNOSIS — Z7982 Long term (current) use of aspirin: Secondary | ICD-10-CM | POA: Insufficient documentation

## 2016-10-14 DIAGNOSIS — M21379 Foot drop, unspecified foot: Secondary | ICD-10-CM

## 2016-10-14 DIAGNOSIS — M4310 Spondylolisthesis, site unspecified: Secondary | ICD-10-CM

## 2016-10-14 DIAGNOSIS — R200 Anesthesia of skin: Secondary | ICD-10-CM

## 2016-10-14 DIAGNOSIS — J449 Chronic obstructive pulmonary disease, unspecified: Secondary | ICD-10-CM | POA: Insufficient documentation

## 2016-10-14 DIAGNOSIS — M4316 Spondylolisthesis, lumbar region: Secondary | ICD-10-CM | POA: Insufficient documentation

## 2016-10-14 DIAGNOSIS — I509 Heart failure, unspecified: Secondary | ICD-10-CM | POA: Insufficient documentation

## 2016-10-14 LAB — TROPONIN I
Troponin I: <0.03 ng/mL (ref <0.03)
Troponin I: <0.03 ng/mL (ref <0.03)

## 2016-10-14 LAB — CBC
HEMATOCRIT: 41.4 % (ref 40.0–52.0)
Hemoglobin: 14.7 g/dL (ref 13.0–18.0)
MCH: 31.5 pg (ref 26.0–34.0)
MCHC: 35.4 g/dL (ref 32.0–36.0)
MCV: 88.8 fL (ref 80.0–100.0)
PLATELETS: 300 10*3/uL (ref 150–440)
RBC: 4.66 MIL/uL (ref 4.40–5.90)
RDW: 13 % (ref 11.5–14.5)
WBC: 8.6 10*3/uL (ref 3.8–10.6)

## 2016-10-14 LAB — BASIC METABOLIC PANEL
Anion gap: 6 (ref 5–15)
BUN: 10 mg/dL (ref 6–20)
CHLORIDE: 104 mmol/L (ref 101–111)
CO2: 27 mmol/L (ref 22–32)
CREATININE: 0.68 mg/dL (ref 0.61–1.24)
Calcium: 9.3 mg/dL (ref 8.9–10.3)
GFR calc Af Amer: >60 mL/min (ref >60)
GFR calc non Af Amer: >60 mL/min (ref >60)
Glucose, Bld: 114 mg/dL — ABNORMAL HIGH (ref 65–99)
POTASSIUM: 3.5 mmol/L (ref 3.5–5.1)
SODIUM: 137 mmol/L (ref 135–145)

## 2016-10-14 LAB — SEDIMENTATION RATE: SED RATE: 11 mm/h (ref 0–20)

## 2016-10-14 LAB — VITAMIN B12: VITAMIN B 12: 242 pg/mL (ref 180–914)

## 2016-10-14 MED ORDER — METHYLPREDNISOLONE 4 MG PO TABS: ORAL_TABLET | ORAL | 0 refills | Status: DC

## 2016-10-14 MED ORDER — GADOBENATE DIMEGLUMINE 529 MG/ML IV SOLN
15.0000 mL | Freq: Once | INTRAVENOUS | Status: AC | PRN
  Administered 2016-10-14: 15 mL via INTRAVENOUS

## 2016-10-14 MED ORDER — LORAZEPAM 2 MG/ML IJ SOLN
1.0000 mg | Freq: Once | INTRAMUSCULAR | Status: AC
  Administered 2016-10-14: 1 mg via INTRAVENOUS
  Filled 2016-10-14: qty 1

## 2016-10-14 NOTE — ED Notes (Addendum)
Pt remains in mri.

## 2016-10-14 NOTE — ED Notes (Signed)
ED Provider at bedside. 

## 2016-10-14 NOTE — ED Triage Notes (Signed)
Pt reports centralized chest pain since last night, reports squeezing sensation inwards. Pt with hx of CVA, MI and 4 stents. Pt A/O, tachypnea, skin warm and dry.

## 2016-10-14 NOTE — ED Notes (Signed)
Pt undressed and ready for MRI. °

## 2016-10-14 NOTE — Discharge Instructions (Signed)
[facility] tomorrow to be evaluated by Dr. Adriana Simasook for the nerve problem in the spine.  You may need to have surgery for your symptoms.   Please make an appointment with your primary care doctor or your cardiologist to be re-evaluated for your chest pain.  Return to the emergency department for severe pain, bowel or bladder changes, inability to walk, or any other symptoms concerning to you.

## 2016-10-14 NOTE — ED Provider Notes (Addendum)
[facility] Emergency Department Provider Note  ____________________________________________  Time seen: Approximately 3:26 PM  I have reviewed the triage vital signs and the nursing notes.   HISTORY  Chief Complaint Chest Pain    HPI Gary Benson is a 56 y.o. male w/ a hx of ED status post MI 4, CVA with resulting left approximately weakness now resolved 5 years ago, on daily for aspirin, presenting for right lower extremity weakness and numbness, as well as chest pain. The patient was seen here 1/6 for the same leg abnormality, and had a complete stroke evaluation including CT brain and MRI brain that did not show any acute abnormality. It was felt that his symptoms were most likely peripheral at that time and he was discharged. Since his evaluation, the patient reports that his numbness has worsened "it feels asleep" and that he has a worsening "foot drag" which is impeding his ability to walk normally. He describes his numbness is starting over the lateral aspect of the right leg at the proximal part of the fibula and spreading downwards through to the foot. He also is concerned that he is unable to pass the brake when driving. He denies any headache, saddle anesthesia, urinary or fecal incontinence or retention, low back pain. He denies intravenous drug abuse or cocaine use. He does have ongoing tobacco abuse. He does not wear tight socks or socks around his knee.  The patient also describes that he's been feeling very worried about his leg, and when he is having difficulty ambulating, his had 2-3 daily episodes of a chest "tingling" in the center part of the chest, nonradiating, lasting several seconds and resolving spontaneously. He does not have any associated shortness breath, palpitations, lightheadedness or syncope. Not feel similar to his previous MI.   Past Medical History:  Diagnosis Date  . CHF (congestive heart failure) (HCC)   . COPD (chronic  obstructive pulmonary disease) (HCC)   . GERD (gastroesophageal reflux disease)   . Hypertension   . Myocardial infarction   . Stroke [facility])     Patient Active Problem List   Diagnosis Date Noted  . Acute CVA (cerebrovascular accident) (HCC) 09/21/2016    Past Surgical History:  Procedure Laterality Date  . CAROTID STENT    . CORONARY ANGIOPLASTY WITH STENT PLACEMENT    . HERNIA REPAIR      Current Outpatient Rx  . Order #: [id number] Class: Historical Med  . Order #: [id number] Class: Print  . Order #: [id number] Class: Historical Med  . Order #: [id number] Class: Historical Med  . Order #: [id number] Class: Normal  . Order #: [id number] Class: Normal  . Order #: [id number] Class: Normal  . Order #: [id number] Class: Print    Allergies Bee venom  Family History  Problem Relation Age of Onset  . Diabetes Mother   . Diabetes Sister     Social History Social History  Substance Use Topics  . Smoking status: Current Every Day Smoker    Packs/day: 1.00  . Smokeless tobacco: Never Used  . Alcohol use No    Review of Systems Constitutional: No fever/chills.No lightheadedness or syncope. No trauma. Eyes: No visual changes. No eye discharge. No blurred or double vision. ENT: No sore throat. No congestion or rhinorrhea. Cardiovascular: Positive chest tingling. Denies palpitations. Respiratory: Denies shortness of breath.  No cough. Gastrointestinal: No abdominal pain.  No nausea, no vomiting.  No diarrhea.  No constipation. Genitourinary: Negative for dysuria. Musculoskeletal: Negative for back pain. Skin: Negative for rash. Neurological: Negative  for headaches. Positive right lower extremity tingling from the knee to the foot. Positive difficulty walking. Positive difficulty with dorsiflexion. 10-point ROS otherwise negative.  ____________________________________________   PHYSICAL EXAM:  VITAL SIGNS: ED Triage Vitals  Enc Vitals Group     BP 10/14/16 1105 122/80     Pulse  Rate 10/14/16 1105 78     Resp 10/14/16 1105 18     Temp 10/14/16 1105 97.9 F (36.6 C)     Temp Source 10/14/16 1105 Oral     SpO2 10/14/16 1105 100 %     Weight 10/14/16 1102 170 lb (77.1 kg)     Height 10/14/16 1102 5\' 9"  (1.753 m)     Head Circumference --      Peak Flow --      Pain Score 10/14/16 1102 6     Pain Loc --      Pain Edu? --      Excl. in GC? --     Constitutional: Alert and oriented. Chronically ill appearing but in no acute distress. Answers questions appropriately. Eyes: Conjunctivae are normal.  EOMI. PERRLA. No scleral icterus. Head: Atraumatic. Nose: No congestion/rhinnorhea. Mouth/Throat: Mucous membranes are moist.  Neck: No stridor.  Supple.  No JVD. Cardiovascular: Normal rate, regular rhythm. No murmurs, rubs or gallops.  Respiratory: Normal respiratory effort.  No accessory muscle use or retractions. Lungs CTAB.  No wheezes, rales or ronchi. Gastrointestinal: Soft, nontender and nondistended.  No guarding or rebound.  No peritoneal signs. Musculoskeletal: No LE edema. No ttp in the calves or palpable cords.  Negative Homan's sign. Neurologic: Alert and oriented 3. Speech is clear.  Face and smile symmetric. Tongue is midline. EOMI and PERRLA. Occasional lazy lateral gaze of the left eye.  No pronator drift. 5 out of 5 grip, biceps, triceps, hip flexors, quadriceps and hamstrings. The patient has 4 out of 5 dorsi flexion and plantar flexion in the right foot. He has 5 out of 5 dorsiflexion and plantarflexion in the left foot.. Decreased sensation to light touch starting over the right proximal fibula near the peroneal nerve area through to the entire distal right foot and leg. Normal DP and PT pulses. Normal skin color. Ambulating the patient, he is able to walk and is steady, he is able to move the right leg and keep the foot flat, said does not have a true foot drop, but does have decreased dorsiflexion Skin:  Skin is warm, dry and intact. No rash noted. No  evidence of trench foot. Psychiatric: Mood and affect are normal. Speech and behavior are normal.  Normal judgement.  ____________________________________________   LABS (all labs ordered are listed, but only abnormal results are displayed)  Labs Reviewed  BASIC METABOLIC PANEL - Abnormal; Notable for the following:       Result Value   Glucose, Bld 114 (*)    All other components within normal limits  CBC  TROPONIN I  TROPONIN I  SEDIMENTATION RATE  HEMOGLOBIN A1C  VITAMIN B12   ____________________________________________  EKG  ED ECG REPORT I, Rockne MenghiniNorman, Anne-Caroline, the attending physician, personally viewed and interpreted this ECG.   Date: 10/14/2016  EKG Time: 1059  Rate: 80  Rhythm: normal sinus rhythm  Axis: normal  Intervals:none  ST&T Change: Poor baseline tracing. Nonspecific T-wave inversion in V1. No ST elevation.  ____________________________________________  RADIOLOGY  Dg Chest 2 View  Result Date: 10/14/2016 CLINICAL DATA:  Chest tightness for greater than 1 week. Right leg numbness and tingling.  EXAM: CHEST  2 VIEW COMPARISON:  09/21/2016 FINDINGS: The lungs appear clear.  Cardiac and mediastinal contours normal. No pleural effusion identified. Remote right posterolateral healed rib fracture, eighth rib. IMPRESSION: 1.  No active cardiopulmonary disease is radiographically apparent. Electronically Signed   By: Gaylyn Rong M.D.   On: 10/14/2016 11:45   Mr Lumbar Spine W Wo Contrast  Result Date: 10/14/2016 CLINICAL DATA:  Foot drop.  RIGHT leg weakness. EXAM: MRI LUMBAR SPINE WITHOUT AND WITH CONTRAST TECHNIQUE: Multiplanar and multiecho pulse sequences of the lumbar spine were obtained without and with intravenous contrast. CONTRAST:  15mL MULTIHANCE GADOBENATE DIMEGLUMINE 529 MG/ML IV SOLN COMPARISON:  MR brain 10/12/2016. FINDINGS: Segmentation:  Standard. Alignment: Anatomic except for 7 mm anterolisthesis L5 on S1. This is related to a unilateral  RIGHT L5 pars defect, as well as mild facet arthropathy. Vertebrae: No worrisome osseous lesion. No abnormal postcontrast enhancement. Conus medullaris: Extends to the L1 level and appears normal. Paraspinal and other soft tissues: Unremarkable. Disc levels: L1-L2:  Annular bulge.  No impingement. L2-L3:  Annular bulge.  No impingement. L3-L4: Central and leftward protrusion. Mild facet arthropathy. No significant stenosis. Slight subarticular zone and foraminal zone narrowing, not clearly compressive. L4-L5: Central and rightward protrusion. RIGHT subarticular zone and foraminal zone narrowing. RIGHT L4 and/or RIGHT L5 nerve root impingement are possible. L5-S1: 7 mm anterolisthesis. Advanced disc space narrowing. Superimposed soft disc protrusion centrally. Uncovering of the disc laterally. RIGHT L5 pars defect. Severe RIGHT and moderate LEFT foraminal narrowing without associated subarticular zone narrowing. RIGHT greater than LEFT L5 nerve root impingement are likely. IMPRESSION: With regard to the RIGHT-sided foot drop, potentially symptomatic RIGHT L5 nerve root impingement appears most likely to result from pathology at the L5-S1 level, where 7 mm of anterolisthesis, disc space narrowing, unilateral RIGHT L5 pars defect, and facet arthropathy contribute to severe RIGHT greater than moderate LEFT foraminal narrowing. Central protrusion at L4-5, eccentric to the RIGHT, extends into the foramen, and could affect the RIGHT L4 and/or L5 nerve roots. Post infusion imaging demonstrates no mass or inflammatory process. Electronically Signed   By: Elsie Stain M.D.   On: 10/14/2016 17:32    ____________________________________________   PROCEDURES  Procedure(s) performed: None  Procedures  Critical Care performed: Yes ____________________________________________   INITIAL IMPRESSION / ASSESSMENT AND PLAN / ED COURSE  Pertinent labs & imaging results that were available during my care of the patient  were reviewed by me and considered in my medical decision making (see chart for details).  56 y.o. male with a history of CAD and CVA presenting with 3-4 days of progressively worsening right lower extremity numbness and weakness, and mostly in dorsiflexion. The patient's symptoms would be atypical for cauda equina syndrome, epidural abscess in the lumbar spine, or spinal cord compression, although some nerve compression at the spinal cord is on the differential so we will obtain a lumbar spine MRI. The patient has had 2 full stroke workups in the recent past including CT and MRI which did not show acute stroke. This distribution of symptoms is not consistent with a central cause, and re-imaging is not indicated at this time. I would also consider other causes including diabetic neuropathy although the patient does not carry a diagnosis of diabetes, or multiple sclerosis although this would've likely shown up on previous MRI, or vitamin deficiency, and particular B12 or thiamine. The patient likely will need EMG conduction studies if his MRI is negative.  ----------------------------------------- 4:14 PM on 10/14/2016 -----------------------------------------  I spoken with Dr. Thad Ranger, the neurologist on call for [place], who agrees with imaging of the lumbar spine, blood work, and outpatient neurologist evaluation for EMG conduction studies if the resulting ED testing is reassuring.  ----------------------------------------- 5:45 PM on 10/14/2016 -----------------------------------------  The patient has a repeat troponin which is negative. His chest pain is atypical and it is unlikely a result of ACS or MI, so his ED evaluation for chest pain has been completed. He will follow-up with his primary care physician for this.  His MRI of the lumbar spine shows the following: With regard to the RIGHT-sided foot drop, potentially symptomatic RIGHT L5 nerve root impingement appears most likely to result  from pathology at the L5-S1 level, where 7 mm of anterolisthesis, disc space narrowing, unilateral RIGHT L5 pars defect, and facet arthropathy contribute to severe RIGHT greater than moderate LEFT foraminal narrowing.  Central protrusion at L4-5, eccentric to the RIGHT, extends into the foramen, and could affect the RIGHT L4 and/or L5 nerve roots.  I've spoken with Dr. Adriana Simas, the neurosurgeon on call. He is recommendation is discharged with a Medrol Dosepak and evaluation at the [facility] neurosurgery clinic tomorrow anytime after 8:30 AM. At this time, the patient is safe for discharge and I will have him follow-up tomorrow. Return precautions as well as follow-up instructions.  CRITICAL CARE Performed by: Rockne Menghini   Total critical care time: 45 minutes  Critical care time was exclusive of separately billable procedures and treating other patients.  Critical care was necessary to treat or prevent imminent or life-threatening deterioration.  Critical care was time spent personally by me on the following activities: development of treatment plan with patient and/or surrogate as well as nursing, discussions with consultants, evaluation of patient's response to treatment, examination of patient, obtaining history from patient or surrogate, ordering and performing treatments and interventions, ordering and review of laboratory studies, ordering and review of radiographic studies, pulse oximetry and re-evaluation of patient's condition.   ____________________________________________  FINAL CLINICAL IMPRESSION(S) / ED DIAGNOSES  Final diagnoses:  Left foot drop  Foot drop  Chest pain, unspecified type  Numbness and tingling of left leg    Clinical Course       NEW MEDICATIONS STARTED DURING THIS VISIT:  New Prescriptions   No medications on file      Rockne Menghini, MD 10/14/16 1752    Rockne Menghini, MD 10/14/16 1800

## 2016-10-15 LAB — HEMOGLOBIN A1C
HEMOGLOBIN A1C: 6 % — AB (ref 4.8–5.6)
Mean Plasma Glucose: 126 mg/dL

## 2017-02-12 ENCOUNTER — Other Ambulatory Visit: Payer: Self-pay | Admitting: Internal Medicine

## 2017-02-12 DIAGNOSIS — I10 Essential (primary) hypertension: Secondary | ICD-10-CM

## 2017-05-15 ENCOUNTER — Other Ambulatory Visit: Payer: Self-pay

## 2017-05-15 DIAGNOSIS — I10 Essential (primary) hypertension: Secondary | ICD-10-CM

## 2017-08-27 ENCOUNTER — Telehealth: Payer: Self-pay | Admitting: Pharmacy Technician

## 2017-08-27 NOTE — Telephone Encounter (Signed)
Patient failed to provide 2018 poi.  No additional medication assistance will be provided by [facility] without the required proof of income documentation.  Patient notified by letter.  Shaundrea Carrigg J. Muhsin Doris Care Manager [facility] 

## 2018-03-31 ENCOUNTER — Other Ambulatory Visit: Payer: Self-pay

## 2018-03-31 ENCOUNTER — Emergency Department: Payer: Worker's Compensation

## 2018-03-31 ENCOUNTER — Encounter: Payer: Self-pay | Admitting: Emergency Medicine

## 2018-03-31 ENCOUNTER — Emergency Department
Admission: EM | Admit: 2018-03-31 | Discharge: 2018-03-31 | Disposition: A | Discharge status: Home / Self Care | Payer: Worker's Compensation | Attending: Emergency Medicine | Admitting: Emergency Medicine

## 2018-03-31 DIAGNOSIS — F17200 Nicotine dependence, unspecified, uncomplicated: Secondary | ICD-10-CM | POA: Diagnosis not present

## 2018-03-31 DIAGNOSIS — I110 Hypertensive heart disease with heart failure: Secondary | ICD-10-CM | POA: Insufficient documentation

## 2018-03-31 DIAGNOSIS — Z8673 Personal history of transient ischemic attack (TIA), and cerebral infarction without residual deficits: Secondary | ICD-10-CM | POA: Diagnosis not present

## 2018-03-31 DIAGNOSIS — W228XXA Striking against or struck by other objects, initial encounter: Secondary | ICD-10-CM | POA: Diagnosis not present

## 2018-03-31 DIAGNOSIS — Z955 Presence of coronary angioplasty implant and graft: Secondary | ICD-10-CM | POA: Insufficient documentation

## 2018-03-31 DIAGNOSIS — Y990 Civilian activity done for income or pay: Secondary | ICD-10-CM | POA: Diagnosis not present

## 2018-03-31 DIAGNOSIS — I252 Old myocardial infarction: Secondary | ICD-10-CM | POA: Diagnosis not present

## 2018-03-31 DIAGNOSIS — S96912A Strain of unspecified muscle and tendon at ankle and foot level, left foot, initial encounter: Secondary | ICD-10-CM | POA: Insufficient documentation

## 2018-03-31 DIAGNOSIS — Z7982 Long term (current) use of aspirin: Secondary | ICD-10-CM | POA: Diagnosis not present

## 2018-03-31 DIAGNOSIS — S93602A Unspecified sprain of left foot, initial encounter: Secondary | ICD-10-CM

## 2018-03-31 DIAGNOSIS — Y9389 Activity, other specified: Secondary | ICD-10-CM | POA: Diagnosis not present

## 2018-03-31 DIAGNOSIS — S81811A Laceration without foreign body, right lower leg, initial encounter: Secondary | ICD-10-CM

## 2018-03-31 DIAGNOSIS — Y929 Unspecified place or not applicable: Secondary | ICD-10-CM | POA: Insufficient documentation

## 2018-03-31 DIAGNOSIS — Z79899 Other long term (current) drug therapy: Secondary | ICD-10-CM | POA: Diagnosis not present

## 2018-03-31 DIAGNOSIS — I509 Heart failure, unspecified: Secondary | ICD-10-CM | POA: Diagnosis not present

## 2018-03-31 DIAGNOSIS — S8992XA Unspecified injury of left lower leg, initial encounter: Secondary | ICD-10-CM | POA: Diagnosis present

## 2018-03-31 MED ORDER — LIDOCAINE HCL (PF) 1 % IJ SOLN
5.0000 mL | Freq: Once | INTRAMUSCULAR | Status: AC
  Administered 2018-03-31: 5 mL

## 2018-03-31 MED ORDER — LIDOCAINE HCL (PF) 1 % IJ SOLN
INTRAMUSCULAR | Status: AC
  Filled 2018-03-31: qty 5

## 2018-03-31 MED ORDER — HYDROCODONE-ACETAMINOPHEN 5-325 MG PO TABS: 1.0000 | ORAL_TABLET | Freq: Four times a day (QID) | ORAL | 0 refills | Status: DC | PRN

## 2018-03-31 MED ORDER — HYDROCODONE-ACETAMINOPHEN 5-325 MG PO TABS
1.0000 | ORAL_TABLET | Freq: Once | ORAL | Status: AC
  Administered 2018-03-31: 1 via ORAL
  Filled 2018-03-31: qty 1

## 2018-03-31 NOTE — ED Notes (Addendum)
See triage note  States he was shutting a door on a loading dock and hit his top of left foot   Very tender to touch good pulses  Also has a abrasion to right lower leg

## 2018-03-31 NOTE — Discharge Instructions (Signed)
Follow-up with [facility] or the clinic recommended by your company since this is Workmen's Comp.  Keep laceration clean and dry.  Clean daily with mild soap and water.  Watch for any signs of infection.  Wear splint for protection of the ankle and foot.  Use crutches when up walking.  Ice and elevate your left leg as needed for swelling and pain.  Take Norco every 6 hours if needed for pain.  You may also take ibuprofen with this medication for inflammation.  Sutures will need to be removed in 10 days.

## 2018-03-31 NOTE — ED Triage Notes (Signed)
Pt states that he was at work and hit his right leg on the loading dock, he got into the work truck and when he went to get out his left leg began hurting. States that he is unsure what he did to it. State that he can not bear weight on it. No swelling or deformity seen.

## 2018-03-31 NOTE — ED Provider Notes (Signed)
[facility] Emergency Department Provider Note  ___________________________________________   First MD Initiated Contact with Patient 03/31/18 1639     (approximate)  I have reviewed the triage vital signs and the nursing notes.   HISTORY  Chief Complaint Leg Injury  HPI Gary Benson is a 57 y.o. male presents to the ED with complaint of bilateral leg pain.  Patient states that while at work today he hit his right leg on a loading dock which caused a small laceration to his right leg.  He states that this incident occurred at approximately 12:00 noon.  A dressing was placed to the area and patient continued to work.  Patient also apparently injured his left leg during the same event.  He states that he got into his truck to continue working and when he started to get out was unable to weight-bear due to pain.  He denies any previous injury to his lower extremities.  He has not taken any over-the-counter medication prior to arrival.  He rates his pain as a 10/10.  He states his last tetanus booster was within 5 years.  Past Medical History:  Diagnosis Date  . CHF (congestive heart failure) (HCC)   . COPD (chronic obstructive pulmonary disease) (HCC)   . GERD (gastroesophageal reflux disease)   . Hypertension   . Myocardial infarction (HCC)   . Stroke [facility](HCC)     Patient Active Problem List   Diagnosis Date Noted  . Acute CVA (cerebrovascular accident) (HCC) 09/21/2016    Past Surgical History:  Procedure Laterality Date  . CAROTID STENT    . CORONARY ANGIOPLASTY WITH STENT PLACEMENT    . HERNIA REPAIR      Prior to Admission medications   Medication Sig Start Date End Date Taking? Authorizing Provider  acetaminophen (TYLENOL) 500 MG tablet Take 1,000 mg by mouth every morning.    [provider]  aspirin 325 MG tablet Take 1 tablet (325 mg total) by mouth daily. 09/23/16   Houston SirenSainani, Vivek J, MD  cyclobenzaprine (FLEXERIL) 10 MG tablet Take  10 mg by mouth at bedtime as needed for muscle spasms.    [provider]  diphenhydramine-acetaminophen (TYLENOL PM) 25-500 MG TABS tablet Take 1 tablet by mouth at bedtime as needed. For sleep/pain    [provider]  HYDROcodone-acetaminophen (NORCO/VICODIN) 5-325 MG tablet Take 1 tablet by mouth every 6 (six) hours as needed for moderate pain. 03/31/18   Tommi RumpsSummers, Shonique Pelphrey L, PA-C  lisinopril (PRINIVIL,ZESTRIL) 5 MG tablet TAKE 1/2 TABLET (2.5MG ) BY MOUTH DAILY. Patient taking differently: Take 2.5 mg by mouth daily.  07/26/16   Zachery Dauerdem, Donna S, FNP  metoprolol succinate (TOPROL-XL) 50 MG 24 hr tablet TAKE 1/2 TABLET (25MG ) BY MOUTH EVERY DAY. REPLACES TOPROL XL. Patient taking differently: Take 25 mg by mouth daily.  07/26/16   Zachery Dauerdem, Donna S, FNP    Allergies Bee venom  Family History  Problem Relation Age of Onset  . Diabetes Mother   . Diabetes Sister     Social History Social History   Tobacco Use  . Smoking status: Current Every Day Smoker    Packs/day: 1.00  . Smokeless tobacco: Never Used  Substance Use Topics  . Alcohol use: No  . Drug use: Not on file    Review of Systems Constitutional: No fever/chills Cardiovascular: Denies chest pain. Respiratory: Denies shortness of breath. Musculoskeletal: Positive for pain bilateral lower extremities. Skin: Positive for laceration right lower leg. Neurological: Negative for  focal weakness or numbness. ____________________________________________   PHYSICAL EXAM:  VITAL SIGNS: ED Triage Vitals  Enc Vitals Group     BP 03/31/18 1624 (!) 152/94     Pulse Rate 03/31/18 1624 93     Resp 03/31/18 1624 20     Temp 03/31/18 1624 98.1 F (36.7 C)     Temp Source 03/31/18 1624 Oral     SpO2 03/31/18 1624 98 %     Weight 03/31/18 1626 172 lb (78 kg)     Height 03/31/18 1626 5\' 8"  (1.727 m)     Head Circumference --      Peak Flow --      Pain Score 03/31/18 1626 10     Pain Loc --      Pain Edu? --       Excl. in GC? --    Constitutional: Alert and oriented. Well appearing and in no acute distress. Eyes: Conjunctivae are normal.  Head: Atraumatic. Neck: No stridor.   Cardiovascular: Normal rate, regular rhythm. Grossly normal heart sounds.  Good peripheral circulation. Respiratory: Normal respiratory effort.  No retractions. Lungs CTAB. Musculoskeletal: Examination of left lower extremity there is no gross deformity and no soft tissue swelling appreciated.  There is marked tenderness on palpation of the anterior tibia.  Range of motion is restricted with passive movement secondary to pain.  Pulses present and skin is intact on the left leg.  On the right lower extremity anterior aspect of the distal tibia there is a superficial laceration measuring approximately 2 cm without foreign body and no active bleeding. Neurologic:  Normal speech and language. No gross focal neurologic deficits are appreciated. No gait instability. Skin:  Skin is warm, dry. Psychiatric: Mood and affect are normal. Speech and behavior are normal.  ____________________________________________   LABS (all labs ordered are listed, but only abnormal results are displayed)  Labs Reviewed - No data to display  RADIOLOGY  ED MD interpretation:  Left foot x-ray is negative for fracture.  Official radiology report(s): Dg Foot Complete Left  Result Date: 03/31/2018 CLINICAL DATA:  Left foot pain and abrasions following an injury. EXAM: LEFT FOOT - COMPLETE 3+ VIEW COMPARISON:  02/03/2014. FINDINGS: Hallux valgus deformity. Calcaneal spur formation. No fracture or dislocation. IMPRESSION: 1. No fracture. 2. Hallux valgus. Electronically Signed   By: Beckie Salts M.D.   On: 03/31/2018 18:08    ____________________________________________   PROCEDURES  Procedure(s) performed:   Marland KitchenMarland KitchenLaceration Repair Date/Time: 03/31/2018 5:56 PM Performed by: Tommi Rumps, PA-C Authorized by: Tommi Rumps, PA-C   Consent:      Consent obtained:  Verbal   Consent given by:  Patient   Risks discussed:  Infection and poor wound healing Anesthesia (see MAR for exact dosages):    Anesthesia method:  Local infiltration   Local anesthetic:  Lidocaine 1% w/o epi Laceration details:    Location:  Leg   Leg location:  R lower leg   Length (cm):  2 Repair type:    Repair type:  Simple Pre-procedure details:    Preparation:  Patient was prepped and draped in usual sterile fashion Exploration:    Hemostasis achieved with:  Direct pressure   Contaminated: no   Treatment:    Area cleansed with:  Saline   Amount of cleaning:  Standard   Irrigation solution:  Sterile saline   Irrigation method:  Pressure wash and syringe   Visualized foreign bodies/material removed: no   Skin repair:  Repair method:  Sutures   Suture size:  4-0   Suture material:  Nylon   Suture technique:  Simple interrupted   Number of sutures:  2 Approximation:    Approximation:  Close Post-procedure details:    Dressing:  Non-adherent dressing   Patient tolerance of procedure:  Tolerated well, no immediate complications  .Splint Application Date/Time: 03/31/2018 6:22 PM Performed by: Madelyn Flavors, NT Authorized by: Tommi Rumps, PA-C   Consent:    Consent obtained:  Verbal   Consent given by:  Patient   Risks discussed:  Pain and swelling Pre-procedure details:    Sensation:  Normal Procedure details:    Laterality:  Left   Location:  Foot   Foot:  L foot   Strapping: yes     Splint type:  Ankle stirrup   Supplies:  Prefabricated splint Post-procedure details:    Pain:  Improved   Sensation:  Normal   Patient tolerance of procedure:  Tolerated well, no immediate complications    Critical Care performed: No  ____________________________________________   INITIAL IMPRESSION / ASSESSMENT AND PLAN / ED COURSE  As part of my medical decision making, I reviewed the following data within the electronic  MEDICAL RECORD NUMBER Notes from prior ED visits and [place] Controlled Substance Database  Patient tolerated laceration repair without any difficulties.  X-rays were discussed and patient was reassured that there was no fracture.  He was placed in a stirrup splint for added support and given crutches.  He was also given instructions on how to clean and care for the laceration repair to his right lower extremity.  Patient was given a note to take to work explaining that he needs to keep this area clean and dry and to elevate as needed for swelling.  He is to follow-up with [facility] acute care for suture removal in 10 days.  Patient was discharged with a prescription for Norco 1 every 6 hours as needed for pain.   ____________________________________________   FINAL CLINICAL IMPRESSION(S) / ED DIAGNOSES  Final diagnoses:  Laceration of skin of right lower leg, initial encounter  Sprain of left foot, initial encounter     ED Discharge Orders        Ordered    HYDROcodone-acetaminophen (NORCO/VICODIN) 5-325 MG tablet  Every 6 hours PRN     03/31/18 1818       Note:  This document was prepared using Dragon voice recognition software and may include unintentional dictation errors.    Tommi Rumps, PA-C 03/31/18 1825    Arnaldo Natal, MD 03/31/18 [phone]

## 2018-04-14 IMAGING — MR MR HEAD W/O CM
11 series · 44 of 48 positions shown · non-contrast
Comparison: Head CT 09/21/2016 and MRI 11/21/2011

CLINICAL DATA: Left arm and leg weakness and tingling. Headache and
dizziness.

EXAM:
MRI HEAD WITHOUT CONTRAST
MRA HEAD WITHOUT CONTRAST
TECHNIQUE: Multiplanar, multiecho pulse sequences of the brain and surrounding
structures were obtained without intravenous contrast. Angiographic
images of the head were obtained using MRA technique without
contrast.

[Series 2: T1 · sagittal · 5.0mm · 0.45mm/px · 3 of 29 slices shown (1 of 2)]
[im 1/29]
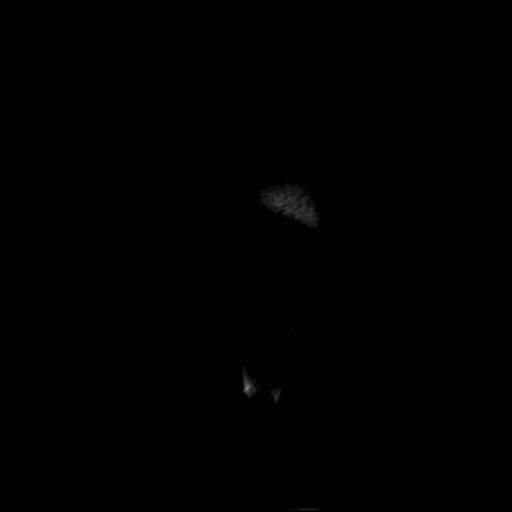
[im 15/29]
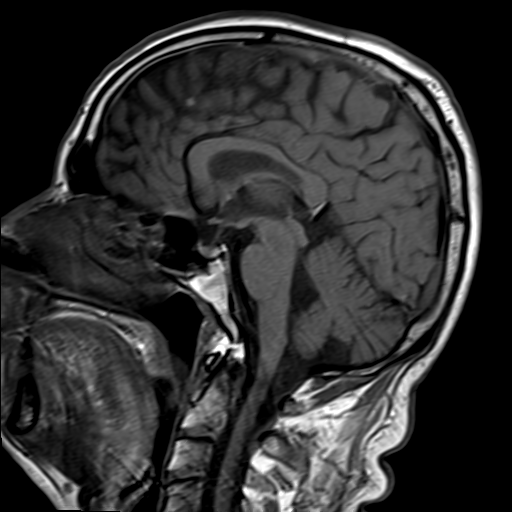
[im 29/29]
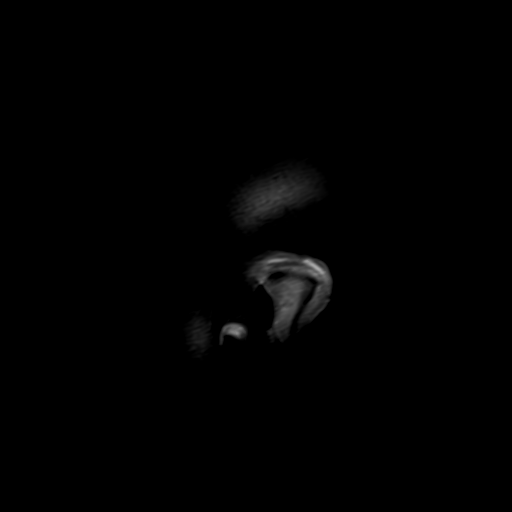

[Series 4: DWI · axial · 3.0mm · 1.80mm/px · z∈[-71,+85]mm · 5 of 55 slices shown (1 of 2)]
[im 1/55]
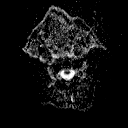
[im 14/55]
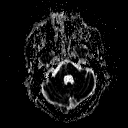
[im 28/55]
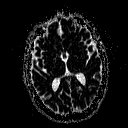
[im 41/55]
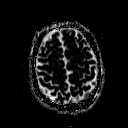
[im 55/55]
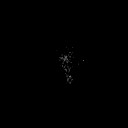

[Series 6: DWI · coronal · 3.0mm · 1.80mm/px · 4 of 47 slices shown (2 of 2)]
[im 1/47]
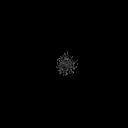
[im 16/47]
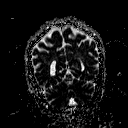
[im 31/47]
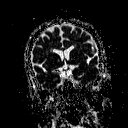
[im 47/47]
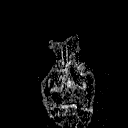

[Series 7: T2 · axial · 5.0mm · 0.90mm/px · z∈[-75,+89]mm · 2 of 27 slices shown (1 of 3)]
[im 1/27]
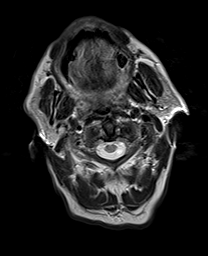
[im 27/27]
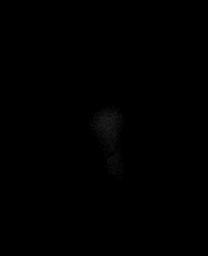

[Series 8: FLAIR · axial · 5.0mm · 0.45mm/px · z∈[-75,+89]mm · 2 of 27 slices shown]
[im 1/27]
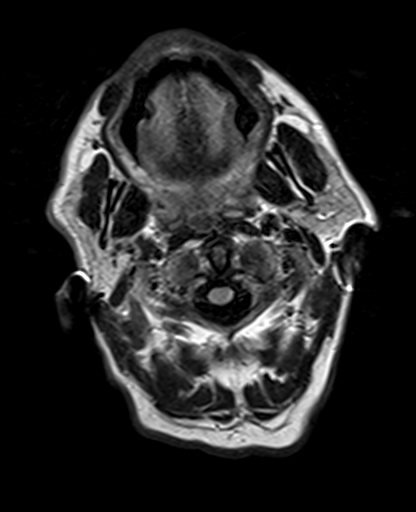
[im 27/27]
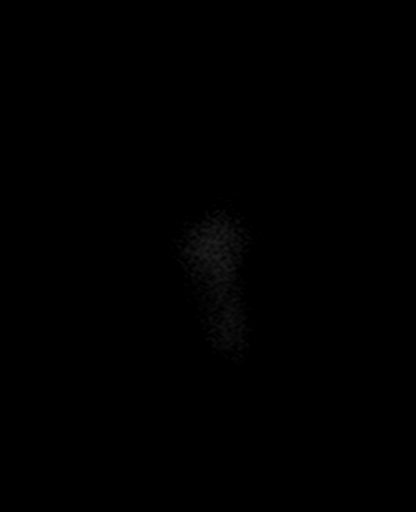

[Series 9: T2 · axial · 5.0mm · 0.45mm/px · z∈[-75,+89]mm · 2 of 27 slices shown (2 of 3)]
[im 1/27]
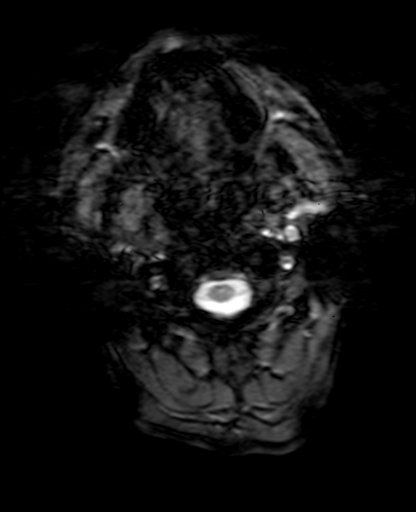
[im 27/27]
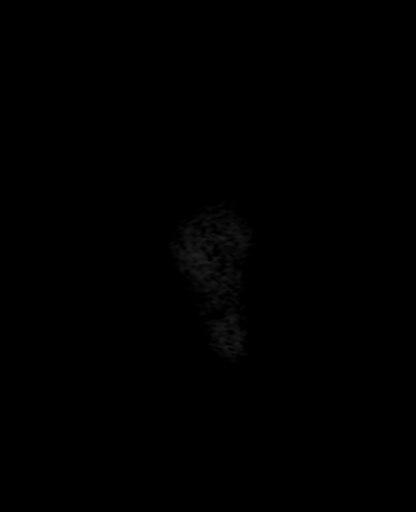

[Series 10: T1 · axial · 3.0mm · 1.00mm/px · z∈[-76,+95]mm · 5 of 60 slices shown (2 of 2)]
[im 1/60]
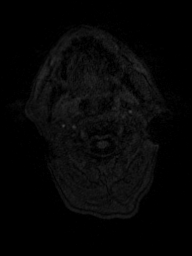
[im 15/60]
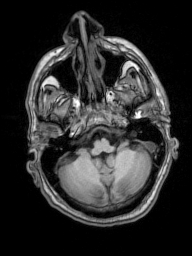
[im 30/60]
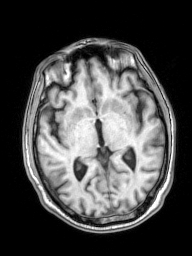
[im 45/60]
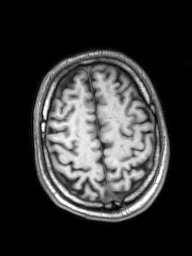
[im 60/60]
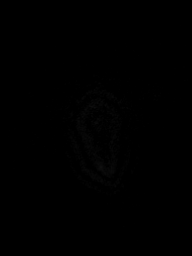

[Series 11: T2 · coronal · 5.0mm · 0.86mm/px · 3 of 31 slices shown (3 of 3)]
[im 1/31]
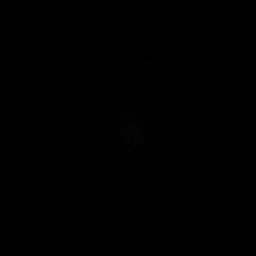
[im 16/31]
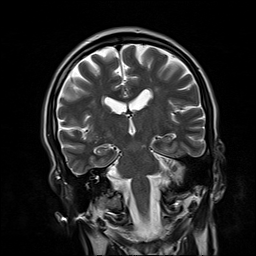
[im 31/31]
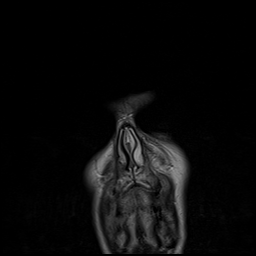

[Series 12: TOF · axial · non-contrast · 0.7mm · 0.37mm/px · z∈[-69,+32]mm · 9 of 150 slices shown]
[im 1/150]
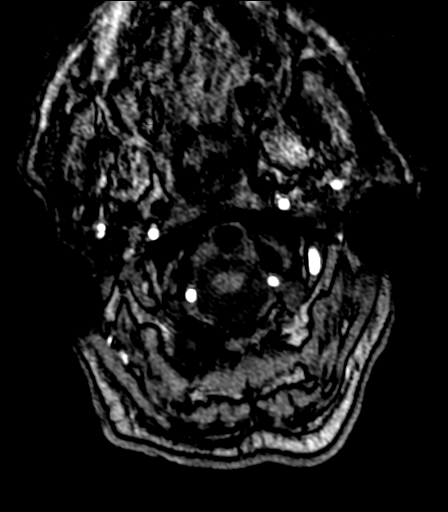
[im 13/150]
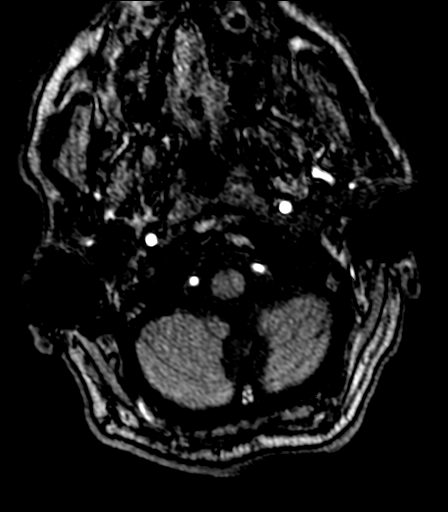
[im 25/150]
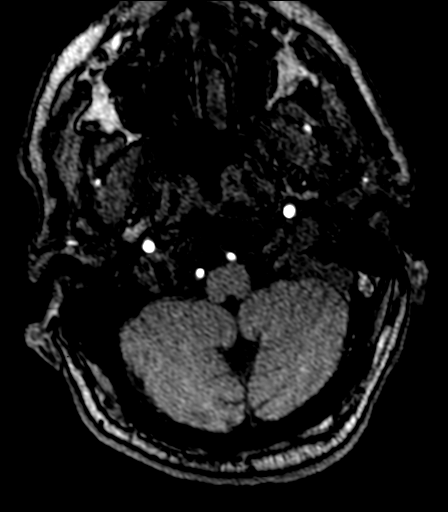
[im 50/150]
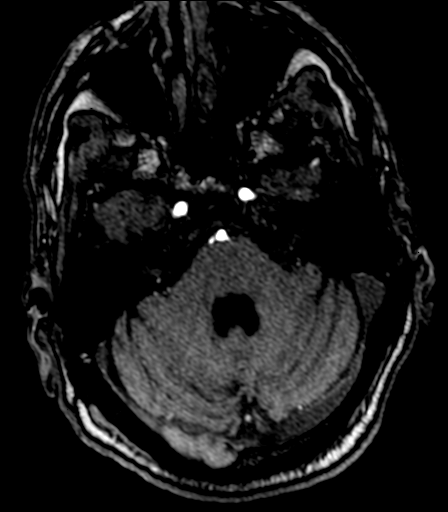
[im 63/150]
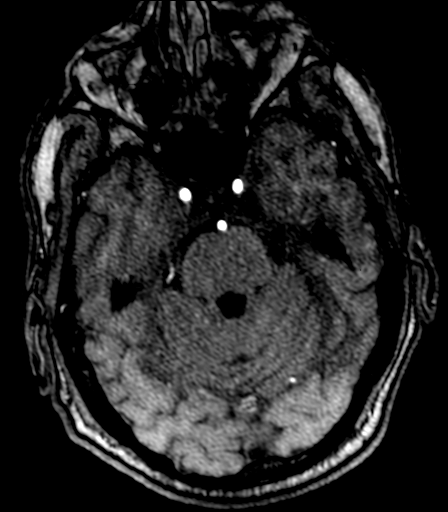
[im 87/150]
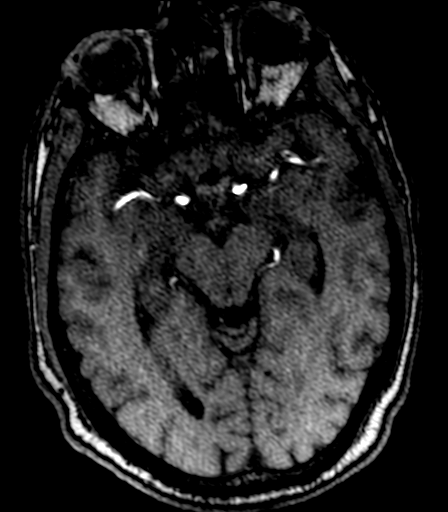
[im 100/150]
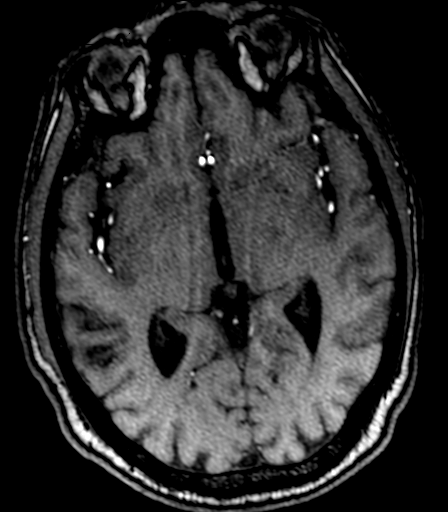
[im 125/150]
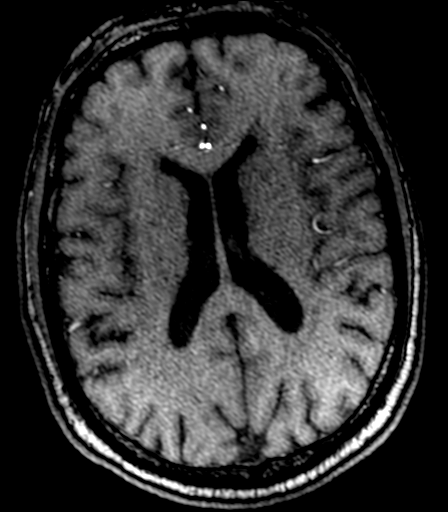
[im 150/150]
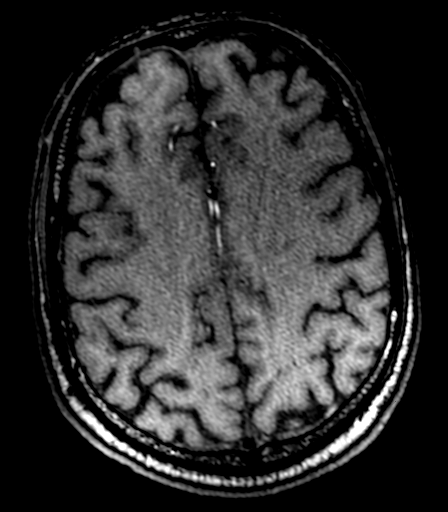

[Series 100: ax (id) · axial · 3.0mm · 1.80mm/px · z∈[-71,+85]mm · 5 of 55 slices shown]
[im 1/55]
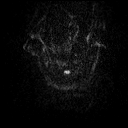
[im 14/55]
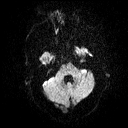
[im 28/55]
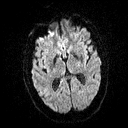
[im 41/55]
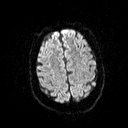
[im 55/55]
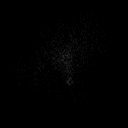

[Series 101: cor (id) · coronal · 3.0mm · 1.80mm/px · 4 of 48 slices shown]
[im 1/48]
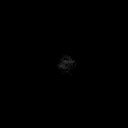
[im 16/48]
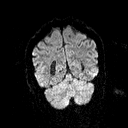
[im 32/48]
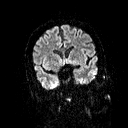
[im 48/48]
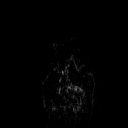

[44 of 48 positions shown; findings below may reference images not displayed]

FINDINGS: MRI HEAD FINDINGS

Brain: There is no evidence of acute infarct, intracranial
hemorrhage, mass, midline shift, or extra-axial fluid collection.
The ventricles and sulci are normal for age. Small foci of T2
hyperintensity in the subcortical and deep cerebral white matter
bilaterally have progressed from 4972 and are nonspecific but
compatible with chronic small vessel ischemic disease, mildly to
moderately advanced for age. Wallerian degeneration is again seen in
the right corticospinal tract at the level of the brainstem and
lower internal capsule. There are chronic lacunar infarcts in the
corona radiata bilaterally, unchanged from 4972.

Vascular: Major intracranial vascular flow voids are preserved.

Skull and upper cervical spine: Unremarkable bone marrow signal.

Sinuses/Orbits: Unremarkable orbits. Mild mucosal thickening in the
paranasal sinuses, greatest in the ethmoids.

Other: None.

MRA HEAD FINDINGS

The visualized distal vertebral arteries are widely patent to the
basilar with the right being slightly dominant. AICAs and SCAs are
patent proximally. Basilar artery is widely patent. PCAs are patent
without evidence of significant proximal stenosis.

The internal carotid arteries are widely patent from skullbase to
carotid termini. ACAs and MCAs are patent without evidence of major
branch occlusion or significant proximal stenosis. No intracranial
aneurysm is identified.
IMPRESSION: 1. No acute intracranial abnormality.
2. Chronic small vessel ischemic disease, mildly to moderately
advanced for age and progressed from 4972.
3. Negative head MRA.

## 2020-10-17 ENCOUNTER — Ambulatory Visit
Admission: RE | Admit: 2020-10-17 | Discharge: 2020-10-17 | Disposition: A | Discharge status: Home / Self Care | Payer: Disability Insurance | Source: Ambulatory Visit | Attending: Family Medicine | Admitting: Family Medicine

## 2020-10-17 ENCOUNTER — Other Ambulatory Visit: Payer: Self-pay | Admitting: Family Medicine

## 2020-10-17 ENCOUNTER — Ambulatory Visit
Admission: RE | Admit: 2020-10-17 | Discharge: 2020-10-17 | Disposition: A | Discharge status: Home / Self Care | Payer: Disability Insurance | Attending: Family Medicine | Admitting: Family Medicine

## 2020-10-17 DIAGNOSIS — M5136 Other intervertebral disc degeneration, lumbar region: Secondary | ICD-10-CM

## 2023-02-12 ENCOUNTER — Ambulatory Visit (INDEPENDENT_AMBULATORY_CARE_PROVIDER_SITE_OTHER): Payer: Self-pay | Admitting: Physician Assistant

## 2023-02-12 ENCOUNTER — Encounter: Payer: Self-pay | Admitting: Physician Assistant

## 2023-02-12 VITALS — BP 134/85 | HR 97 | Temp 98.1°F | Ht 68.0 in | Wt 155.0 lb

## 2023-02-12 DIAGNOSIS — I152 Hypertension secondary to endocrine disorders: Secondary | ICD-10-CM | POA: Insufficient documentation

## 2023-02-12 DIAGNOSIS — L84 Corns and callosities: Secondary | ICD-10-CM

## 2023-02-12 DIAGNOSIS — I10 Essential (primary) hypertension: Secondary | ICD-10-CM

## 2023-02-12 DIAGNOSIS — Z720 Tobacco use: Secondary | ICD-10-CM

## 2023-02-12 DIAGNOSIS — Z125 Encounter for screening for malignant neoplasm of prostate: Secondary | ICD-10-CM

## 2023-02-12 DIAGNOSIS — E785 Hyperlipidemia, unspecified: Secondary | ICD-10-CM

## 2023-02-12 DIAGNOSIS — R739 Hyperglycemia, unspecified: Secondary | ICD-10-CM

## 2023-02-12 DIAGNOSIS — Z1211 Encounter for screening for malignant neoplasm of colon: Secondary | ICD-10-CM

## 2023-02-12 DIAGNOSIS — K219 Gastro-esophageal reflux disease without esophagitis: Secondary | ICD-10-CM

## 2023-02-12 DIAGNOSIS — E1169 Type 2 diabetes mellitus with other specified complication: Secondary | ICD-10-CM | POA: Insufficient documentation

## 2023-02-12 DIAGNOSIS — F429 Obsessive-compulsive disorder, unspecified: Secondary | ICD-10-CM

## 2023-02-12 DIAGNOSIS — I252 Old myocardial infarction: Secondary | ICD-10-CM | POA: Insufficient documentation

## 2023-02-12 DIAGNOSIS — Z8673 Personal history of transient ischemic attack (TIA), and cerebral infarction without residual deficits: Secondary | ICD-10-CM

## 2023-02-12 MED ORDER — SERTRALINE HCL 50 MG PO TABS: 50.0000 mg | ORAL_TABLET | Freq: Every day | ORAL | 1 refills | Status: DC

## 2023-02-12 NOTE — Assessment & Plan Note (Signed)
W/ possible history of esophageal dilation/stricture. Pt reports if he takes famotidine daily and is careful when swallowing he has no symptoms.

## 2023-02-12 NOTE — Assessment & Plan Note (Signed)
Given description of daily habits, likely consistent with OCD.  As pt is open to trying meds; will start zoloft 50 mg. Advised SE.

## 2023-02-12 NOTE — Assessment & Plan Note (Signed)
Unsure when this occurred, or when his stents were done.  Continue ASA, will get lipids and start statin as indicated.  Referring to cardio for w/u. Pt is currently asymptomatic

## 2023-02-12 NOTE — Assessment & Plan Note (Signed)
Will get lipids and tx as indicated

## 2023-02-12 NOTE — Progress Notes (Signed)
New patient visit   Patient: Gary Benson   DOB: [birth year]   62 y.o. Male  MRN: [id number] Visit Date: 02/12/2023  Today's healthcare provider: Alfredia Ferguson, PA-C   Chief Complaint  Patient presents with   Hypertension   Hyperlipidemia   Subjective    Gary Benson is a 62 y.o. male who presents today as a new patient to establish care.  HPI   Pt reports he has not had regular insurance and has not been to a clinic in around 8 years.   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 He is unsure when this happened. He reports at the same time, he had a minor heart attack. Reports knowing he has 4 stents. Reports a history of HTN, HLD, heart failure.   Currently, he takes ASA, famotidine for reflux -- reports he had a history of dysphagia w/ esophageal dilation; and tylenol.   Overall feels very well. Reports some fatigue in his lower extremities when walking. Otherwise denies chest pain, SOB, fatigue.   He is a long time smoker with no plans to stop.  Pt reports he has OCD-- only present at home but it does affect him daily. He states if something is moved, he needs to move it back immediately. Reports he is not distressed by this unless he cannot move the item back. He would be interested in starting medication.  Pt reports he has some numbness of his toes, and his right foot as a painful, burning callous.   Past Medical History:  Diagnosis Date   CHF (congestive heart failure) (HCC)    COPD (chronic obstructive pulmonary disease) (HCC)    GERD (gastroesophageal reflux disease)    Hypertension    Myocardial infarction (HCC)    Stroke (HCC)    Past Surgical History:  Procedure Laterality Date   CAROTID STENT     CORONARY ANGIOPLASTY WITH STENT PLACEMENT     EYE SURGERY     HERNIA REPAIR     Family Status  Relation Name Status   Mother  (Not Specified)   Father  (Not Specified)   Sister  (Not Specified)   Family  History  Problem Relation Age of Onset   Diabetes Mother    Heart disease Father    Diabetes Sister    Social History   Socioeconomic History   Marital status: Married    Spouse name: Not on file   Number of children: Not on file   Years of education: Not on file   Highest education level: Not on file  Occupational History   Not on file  Tobacco Use   Smoking status: Every Day    Packs/day: 1    Types: Cigarettes   Smokeless tobacco: Never  Substance and Sexual Activity   Alcohol use: No   Drug use: Never   Sexual activity: Not on file  Other Topics Concern   Not on file  Social History Narrative   Not on file   Social Determinants of Health   Financial Resource Strain: Not on file  Food Insecurity: No Food Insecurity (02/12/2023)   Hunger Vital Sign    Worried About Running Out of Food in the Last Year: Never true    Ran Out of Food in the Last Year: Never true  Transportation Needs: No Transportation Needs (02/12/2023)   PRAPARE - Administrator, Civil Service (Medical): No  Lack of Transportation (Non-Medical): No  Physical Activity: Not on file  Stress: Not on file  Social Connections: Not on file   Outpatient Medications Prior to Visit  Medication Sig   acetaminophen (TYLENOL) 500 MG tablet Take 1,000 mg by mouth every morning.   aspirin 325 MG tablet Take 1 tablet (325 mg total) by mouth daily.   famotidine (PEPCID) 20 MG tablet Take 20 mg by mouth 2 (two) times daily.   [DISCONTINUED] cyclobenzaprine (FLEXERIL) 10 MG tablet Take 10 mg by mouth at bedtime as needed for muscle spasms. (Patient not taking: Reported on 02/12/2023)   [DISCONTINUED] diphenhydramine-acetaminophen (TYLENOL PM) 25-500 MG TABS tablet Take 1 tablet by mouth at bedtime as needed. For sleep/pain (Patient not taking: Reported on 02/12/2023)   [DISCONTINUED] HYDROcodone-acetaminophen (NORCO/VICODIN) 5-325 MG tablet Take 1 tablet by mouth every 6 (six) hours as needed for moderate  pain. (Patient not taking: Reported on 02/12/2023)   [DISCONTINUED] lisinopril (PRINIVIL,ZESTRIL) 5 MG tablet TAKE 1/2 TABLET (2.5MG ) BY MOUTH DAILY. (Patient not taking: Reported on 02/12/2023)   [DISCONTINUED] metoprolol succinate (TOPROL-XL) 50 MG 24 hr tablet TAKE 1/2 TABLET (25MG ) BY MOUTH EVERY DAY. REPLACES TOPROL XL. (Patient not taking: Reported on 02/12/2023)   No facility-administered medications prior to visit.   Allergies  Allergen Reactions   Bee Venom Anaphylaxis    Immunization History  Administered Date(s) Administered   Influenza,inj,Quad PF,6+ Mos 09/22/2016   Pneumococcal Polysaccharide-23 09/22/2016   Tdap 07/07/2015    Health Maintenance  Topic Date Due   Medicare Annual Wellness (AWV)  Never done   COVID-19 Vaccine (1) Never done   HIV Screening  Never done   Hepatitis C Screening  Never done   COLONOSCOPY (Pts 45-85yrs Insurance coverage will need to be confirmed)  Never done   Zoster Vaccines- Shingrix (1 of 2) Never done   Lung Cancer Screening  12/22/2013   INFLUENZA VACCINE  05/08/2023   DTaP/Tdap/Td (2 - Td or Tdap) 07/06/2025   HPV VACCINES  Aged Out    Patient Care Team: Alfredia Ferguson, PA-C as PCP - General (Physician Assistant)  Review of Systems  Constitutional: Negative.   HENT:  Positive for sneezing and sore throat.   Eyes: Negative.   Respiratory: Negative.    Cardiovascular: Negative.   Gastrointestinal: Negative.   Endocrine: Negative.   Genitourinary: Negative.   Musculoskeletal:  Positive for back pain.  Skin: Negative.   Allergic/Immunologic: Negative.   Neurological: Negative.   Hematological: Negative.   Psychiatric/Behavioral: Negative.       Objective    BP 134/85 (BP Location: Left Arm, Patient Position: Sitting, Cuff Size: Normal)   Pulse 97   Temp 98.1 F (36.7 C) (Oral)   Ht 5\' 8"  (1.727 m)   Wt 155 lb (70.3 kg)   SpO2 98%   BMI 23.57 kg/m   Physical Exam Constitutional:      General: He is awake.      Appearance: He is well-developed.  HENT:     Head: Normocephalic.  Eyes:     Conjunctiva/sclera: Conjunctivae normal.  Cardiovascular:     Rate and Rhythm: Normal rate and regular rhythm.     Heart sounds: Normal heart sounds.  Pulmonary:     Effort: Pulmonary effort is normal.     Breath sounds: Normal breath sounds.  Musculoskeletal:     Right lower leg: No edema.     Left lower leg: No edema.  Feet:     Comments: Right foot with wart  vs callus no ulceration Skin:    General: Skin is warm.  Neurological:     Mental Status: He is alert and oriented to person, place, and time.  Psychiatric:        Attention and Perception: Attention normal.        Mood and Affect: Mood normal.        Speech: Speech normal.        Behavior: Behavior is cooperative.    Depression Screen    02/12/2023   10:25 AM  PHQ 2/9 Scores  PHQ - 2 Score 0  PHQ- 9 Score 0   No results found for any visits on 02/12/23.  Assessment & Plan      Problem List Items Addressed This Visit       Cardiovascular and Mediastinum   Primary hypertension    In relatively appropriate range today. Will hold off on treating until labs are back.        Relevant Orders   CBC w/Diff/Platelet   Comprehensive Metabolic Panel (CMET)     Digestive   Gastroesophageal reflux disease    W/ possible history of esophageal dilation/stricture. Pt reports if he takes famotidine daily and is careful when swallowing he has no symptoms.        Relevant Medications   famotidine (PEPCID) 20 MG tablet     Other   History of CVA (cerebrovascular accident) - Primary    Unsure when this occurred. I see hospitalization in 2017 where CVA is on dx list but in discharge summary-- was considered a TIA as CT/MRI/MRA was done w/ no findings. Echo done at that time, EF > 55%  Advised pt to continue with ASA, we will check lipids, will likely need statin. Pt aware he needs to stop smoking. Referring to neuro.       Relevant Orders    Ambulatory referral to Cardiology   Ambulatory referral to Neurology   CBC w/Diff/Platelet   Comprehensive Metabolic Panel (CMET)   History of MI (myocardial infarction)    Unsure when this occurred, or when his stents were done.  Continue ASA, will get lipids and start statin as indicated.  Referring to cardio for w/u. Pt is currently asymptomatic      Relevant Orders   Ambulatory referral to Cardiology   Ambulatory referral to Neurology   CBC w/Diff/Platelet   Comprehensive Metabolic Panel (CMET)   Hyperlipidemia    Will get lipids and tx as indicated      Relevant Orders   Lipid Profile   Tobacco use    Pt aware he needs to stop smoking, no interest. No interest in lung cancer screening at this time      Relevant Orders   CBC w/Diff/Platelet   Comprehensive Metabolic Panel (CMET)   Lipid Profile   Obsessive-compulsive disorder    Given description of daily habits, likely consistent with OCD.  As pt is open to trying meds; will start zoloft 50 mg. Advised SE.        Relevant Medications   sertraline (ZOLOFT) 50 MG tablet   Other Visit Diagnoses     Hyperglycemia       Relevant Orders   HgB A1c   Prostate cancer screening       Relevant Orders   PSA   Colon cancer screening       Relevant Orders   Ambulatory referral to Gastroenterology   Callus of foot          11.  Callus of foot Advised I can refer to podiatry-- pt would like to hold off.  Return pending labs.Joslyn Devon, PA-C have reviewed all documentation for this visit. The documentation on  02/12/23   for the exam, diagnosis, procedures, and orders are all accurate and complete.  Alfredia Ferguson, PA-C [facility] [address] [place], [place], [address] Office: [phone] Fax: [phone]   [facility]

## 2023-02-12 NOTE — Assessment & Plan Note (Signed)
In relatively appropriate range today. Will hold off on treating until labs are back.

## 2023-02-12 NOTE — Assessment & Plan Note (Signed)
Pt aware he needs to stop smoking, no interest. No interest in lung cancer screening at this time

## 2023-02-12 NOTE — Assessment & Plan Note (Signed)
Unsure when this occurred. I see hospitalization in 2017 where CVA is on dx list but in discharge summary-- was considered a TIA as CT/MRI/MRA was done w/ no findings. Echo done at that time, EF > 55%  Advised pt to continue with ASA, we will check lipids, will likely need statin. Pt aware he needs to stop smoking. Referring to neuro.

## 2023-02-13 ENCOUNTER — Other Ambulatory Visit: Payer: Self-pay | Admitting: Physician Assistant

## 2023-02-13 DIAGNOSIS — E1165 Type 2 diabetes mellitus with hyperglycemia: Secondary | ICD-10-CM

## 2023-02-13 DIAGNOSIS — R972 Elevated prostate specific antigen [PSA]: Secondary | ICD-10-CM

## 2023-02-13 DIAGNOSIS — E1169 Type 2 diabetes mellitus with other specified complication: Secondary | ICD-10-CM

## 2023-02-13 LAB — COMPREHENSIVE METABOLIC PANEL
ALT: 15 IU/L (ref 0–44)
AST: 11 IU/L (ref 0–40)
Albumin/Globulin Ratio: 1.6 (ref 1.2–2.2)
Albumin: 4.4 g/dL (ref 3.8–4.9)
Alkaline Phosphatase: 128 IU/L — ABNORMAL HIGH (ref 44–121)
BUN/Creatinine Ratio: 6 — ABNORMAL LOW (ref 10–24)
BUN: 6 mg/dL — ABNORMAL LOW (ref 8–27)
Bilirubin Total: 0.3 mg/dL (ref 0.0–1.2)
CO2: 20 mmol/L (ref 20–29)
Calcium: 9.5 mg/dL (ref 8.6–10.2)
Chloride: 97 mmol/L (ref 96–106)
Creatinine, Ser: 0.95 mg/dL (ref 0.76–1.27)
Globulin, Total: 2.7 g/dL (ref 1.5–4.5)
Glucose: 363 mg/dL — ABNORMAL HIGH (ref 70–99)
Potassium: 3.3 mmol/L — ABNORMAL LOW (ref 3.5–5.2)
Sodium: 136 mmol/L (ref 134–144)
Total Protein: 7.1 g/dL (ref 6.0–8.5)
eGFR: 92 mL/min/{1.73_m2} (ref >59)

## 2023-02-13 LAB — LIPID PANEL
Chol/HDL Ratio: 5.3 ratio — ABNORMAL HIGH (ref 0.0–5.0)
Cholesterol, Total: 206 mg/dL — ABNORMAL HIGH (ref 100–199)
HDL: 39 mg/dL — ABNORMAL LOW (ref >39)
LDL Chol Calc (NIH): 127 mg/dL — ABNORMAL HIGH (ref 0–99)
Triglycerides: 224 mg/dL — ABNORMAL HIGH (ref 0–149)
VLDL Cholesterol Cal: 40 mg/dL (ref 5–40)

## 2023-02-13 LAB — CBC WITH DIFFERENTIAL/PLATELET
Basophils Absolute: 0.1 10*3/uL (ref 0.0–0.2)
Basos: 1 %
EOS (ABSOLUTE): 0.3 10*3/uL (ref 0.0–0.4)
Eos: 4 %
Hematocrit: 44.6 % (ref 37.5–51.0)
Hemoglobin: 14.2 g/dL (ref 13.0–17.7)
Immature Grans (Abs): 0 10*3/uL (ref 0.0–0.1)
Immature Granulocytes: 0 %
Lymphocytes Absolute: 2.3 10*3/uL (ref 0.7–3.1)
Lymphs: 28 %
MCH: 29.2 pg (ref 26.6–33.0)
MCHC: 31.8 g/dL (ref 31.5–35.7)
MCV: 92 fL (ref 79–97)
Monocytes Absolute: 0.5 10*3/uL (ref 0.1–0.9)
Monocytes: 6 %
Neutrophils Absolute: 5 10*3/uL (ref 1.4–7.0)
Neutrophils: 61 %
Platelets: 250 10*3/uL (ref 150–450)
RBC: 4.86 x10E6/uL (ref 4.14–5.80)
RDW: 12.1 % (ref 11.6–15.4)
WBC: 8.2 10*3/uL (ref 3.4–10.8)

## 2023-02-13 LAB — HEMOGLOBIN A1C
Est. average glucose Bld gHb Est-mCnc: 315 mg/dL
Hgb A1c MFr Bld: 12.6 % — ABNORMAL HIGH (ref 4.8–5.6)

## 2023-02-13 LAB — PSA: Prostate Specific Ag, Serum: 33.8 ng/mL — ABNORMAL HIGH (ref 0.0–4.0)

## 2023-02-13 MED ORDER — ROSUVASTATIN CALCIUM 20 MG PO TABS: 20.0000 mg | ORAL_TABLET | Freq: Every day | ORAL | 3 refills | Status: DC

## 2023-02-13 MED ORDER — LANCET DEVICE MISC
1.0000 | Freq: Three times a day (TID) | 0 refills | Status: DC
Start: 2023-02-13 — End: 2023-02-17

## 2023-02-13 MED ORDER — BLOOD GLUCOSE TEST VI STRP
1.0000 | ORAL_STRIP | Freq: Three times a day (TID) | 0 refills | Status: DC
Start: 2023-02-13 — End: 2023-02-17

## 2023-02-13 MED ORDER — LANCETS MISC. MISC
1.0000 | Freq: Three times a day (TID) | 0 refills | Status: AC
Start: 2023-02-13 — End: 2023-03-15

## 2023-02-13 MED ORDER — METFORMIN HCL ER 500 MG PO TB24: 500.0000 mg | ORAL_TABLET | Freq: Every day | ORAL | 0 refills | Status: DC

## 2023-02-13 MED ORDER — BLOOD GLUCOSE MONITORING SUPPL DEVI
1.0000 | Freq: Three times a day (TID) | 0 refills | Status: DC
Start: 2023-02-13 — End: 2023-02-17

## 2023-02-13 NOTE — Progress Notes (Signed)
It was sent

## 2023-02-17 ENCOUNTER — Telehealth: Payer: Self-pay

## 2023-02-17 ENCOUNTER — Other Ambulatory Visit: Payer: Self-pay

## 2023-02-17 ENCOUNTER — Other Ambulatory Visit: Payer: Self-pay | Admitting: Physician Assistant

## 2023-02-17 DIAGNOSIS — Z1211 Encounter for screening for malignant neoplasm of colon: Secondary | ICD-10-CM

## 2023-02-17 DIAGNOSIS — E1165 Type 2 diabetes mellitus with hyperglycemia: Secondary | ICD-10-CM

## 2023-02-17 MED ORDER — NA SULFATE-K SULFATE-MG SULF 17.5-3.13-1.6 GM/177ML PO SOLN: 1.0000 | Freq: Once | ORAL | 0 refills | Status: AC

## 2023-02-17 NOTE — Progress Notes (Signed)
Established Patient Office Visit  Subjective   Patient ID: Gary Benson, male    DOB: Feb 04, 1961  Age: 62 y.o. MRN: [id number]  No chief complaint on file.   HPI    ROS    Objective:     There were no vitals taken for this visit.   Physical Exam   No results found for any visits on 02/17/23.    The ASCVD Risk score (Arnett DK, et al., 2019) failed to calculate for the following reasons:   The patient has a prior MI or stroke diagnosis    Assessment & Plan:   Problem List Items Addressed This Visit   None Visit Diagnoses     Encounter for screening colonoscopy    -  Primary       No follow-ups on file.    Avie Arenas, CMA

## 2023-02-17 NOTE — Telephone Encounter (Signed)
Gastroenterology Pre-Procedure Review  Request Date: 05/19/23 Requesting Physician: Dr. Tobi Bastos  PATIENT REVIEW QUESTIONS: The patient responded to the following health history questions as indicated:    1. Are you having any GI issues? no 2. Do you have a personal history of Polyps? no 3. Do you have a family history of Colon Cancer or Polyps? no 4. Diabetes Mellitus? yes (has not started metformin yet but has been advised to stop it 2 days prior to colonoscopy ) 5. Joint replacements in the past 12 months?no 6. Major health problems in the past 3 months?no 7. Any artificial heart valves, MVP, or defibrillator?no    MEDICATIONS & ALLERGIES:    Patient reports the following regarding taking any anticoagulation/antiplatelet therapy:   Plavix, Coumadin, Eliquis, Xarelto, Lovenox, Pradaxa, Brilinta, or Effient? no Aspirin? Takes 81mg  daily not 325mg   Patient confirms/reports the following medications:  Current Outpatient Medications  Medication Sig Dispense Refill   acetaminophen (TYLENOL) 500 MG tablet Take 1,000 mg by mouth every morning.     Blood Glucose Monitoring Suppl DEVI 1 each by Does not apply route in the morning, at noon, and at bedtime. May substitute to any manufacturer covered by patient's insurance. 1 each 0   Glucose Blood (BLOOD GLUCOSE TEST STRIPS) STRP 1 each by In Vitro route in the morning, at noon, and at bedtime. May substitute to any manufacturer covered by patient's insurance. 100 strip 0   Lancet Device MISC 1 each by Does not apply route in the morning, at noon, and at bedtime. May substitute to any manufacturer covered by patient's insurance. 1 each 0   Lancets Misc. MISC 1 each by Does not apply route in the morning, at noon, and at bedtime. May substitute to any manufacturer covered by patient's insurance. 100 each 0   aspirin 325 MG tablet Take 1 tablet (325 mg total) by mouth daily. 30 tablet 1   famotidine (PEPCID) 20 MG tablet Take 20 mg by mouth 2 (two)  times daily. (Patient not taking: Reported on 02/17/2023)     metFORMIN (GLUCOPHAGE-XR) 500 MG 24 hr tablet Take 1 tablet (500 mg total) by mouth daily with breakfast. For 2 days, then take 1000 mg daily with breakfast for a week. After this, increase to 1000 mg twice daily. (Patient not taking: Reported on 02/17/2023) 180 tablet 0   rosuvastatin (CRESTOR) 20 MG tablet Take 1 tablet (20 mg total) by mouth daily. (Patient not taking: Reported on 02/17/2023) 90 tablet 3   sertraline (ZOLOFT) 50 MG tablet Take 1 tablet (50 mg total) by mouth daily. (Patient not taking: Reported on 02/17/2023) 90 tablet 1   TRUEplus Lancets 33G MISC      No current facility-administered medications for this visit.    Patient confirms/reports the following allergies:  Allergies  Allergen Reactions   Bee Venom Anaphylaxis    No orders of the defined types were placed in this encounter.   AUTHORIZATION INFORMATION Primary Insurance: 1D#: Group #:  Secondary Insurance: 1D#: Group #:  SCHEDULE INFORMATION: Date:  Time: Location:

## 2023-02-24 ENCOUNTER — Ambulatory Visit: Payer: Self-pay

## 2023-02-24 ENCOUNTER — Other Ambulatory Visit: Payer: Self-pay

## 2023-02-24 NOTE — Telephone Encounter (Signed)
  Chief Complaint: medication problem Symptoms: diarrhea x 5, LLQ pain  Frequency: yesterday  Pertinent Negatives: Patient denies nausea or vomiting  Disposition: [] ED /[] Urgent Care (no appt availability in office) / [x] Appointment(In office/virtual)/ []  [facility]/ [] Home Care/ [] Refused Recommended Disposition /[]  Mobile Bus/ []  Follow-up with PCP Additional Notes: pt taking Metformin and increased to 1000mg  on Saturday and has give him bad diarrhea. Pt states he thinks sx are from the medication but aint sure. Pt is taking Imodium to help with diarrhea and is slightly effective. Advised him to be seen just to r/o anything else that can cause sx. Scheduled for tomorrow at 1320 with PCP.   Summary: reaction to medication   Pt states he started taking two pills a day (metFORMIN (GLUCOPHAGE-XR) 500 MG 24 hr tablet) and it is giving him constant diarrhea and the lower left side of his back started hurting yesterday. Pt states that his diarrhea is coming out like water. Please call pt back.       Reason for Disposition  [1] Caller has URGENT medicine question about med that PCP or specialist prescribed AND [2] triager unable to answer question  Answer Assessment - Initial Assessment Questions 1. NAME of MEDICINE: "What medicine(s) are you calling about?"     Metformin  2. QUESTION: "What is your question?" (e.g., double dose of medicine, side effect)     Wanting SE from medication  3. PRESCRIBER: "Who prescribed the medicine?" Reason: if prescribed by specialist, call should be referred to that group.     Lillia Abed, PA  4. SYMPTOMS: "Do you have any symptoms?" If Yes, ask: "What symptoms are you having?"  "How bad are the symptoms (e.g., mild, moderate, severe)     Diarrhea and LLQ abdominal pain  Protocols used: Medication Question Call-A-AH

## 2023-02-25 ENCOUNTER — Ambulatory Visit: Payer: Medicare HMO | Admitting: Physician Assistant

## 2023-03-25 ENCOUNTER — Ambulatory Visit: Payer: Medicare HMO | Admitting: Urology

## 2023-04-02 ENCOUNTER — Telehealth: Payer: Self-pay

## 2023-04-02 NOTE — Telephone Encounter (Signed)
Copied from CRM [id number]. Topic: General - Other >> Apr 02, 2023 12:34 PM Eleonore Chiquito wrote: Reason for CRM: Pt states that he is having a hard time getting to sleep because of his restless legs. 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

## 2023-04-03 ENCOUNTER — Other Ambulatory Visit: Payer: Self-pay | Admitting: Physician Assistant

## 2023-04-03 DIAGNOSIS — E1165 Type 2 diabetes mellitus with hyperglycemia: Secondary | ICD-10-CM

## 2023-04-03 DIAGNOSIS — Z91030 Bee allergy status: Secondary | ICD-10-CM

## 2023-04-03 MED ORDER — EPINEPHRINE 0.3 MG/0.3ML IJ SOAJ
0.3000 mg | INTRAMUSCULAR | 1 refills | Status: AC | PRN
Start: 2023-04-03 — End: ?

## 2023-04-03 NOTE — Telephone Encounter (Signed)
Pt went ahead and made appointment with other provider, pt asked if it will be sent in today?

## 2023-04-04 NOTE — Telephone Encounter (Signed)
Patient advised. Verbalized understanding

## 2023-04-05 ENCOUNTER — Other Ambulatory Visit: Payer: Self-pay | Admitting: Physician Assistant

## 2023-04-05 DIAGNOSIS — E1165 Type 2 diabetes mellitus with hyperglycemia: Secondary | ICD-10-CM

## 2023-04-21 ENCOUNTER — Ambulatory Visit: Payer: Disability Insurance | Admitting: Cardiology

## 2023-04-21 ENCOUNTER — Encounter: Payer: Self-pay | Admitting: Family Medicine

## 2023-04-21 ENCOUNTER — Ambulatory Visit (INDEPENDENT_AMBULATORY_CARE_PROVIDER_SITE_OTHER): Payer: Medicare HMO | Admitting: Family Medicine

## 2023-04-21 VITALS — BP 135/73 | HR 85 | Temp 98.1°F | Ht 68.0 in | Wt 155.0 lb

## 2023-04-21 DIAGNOSIS — I252 Old myocardial infarction: Secondary | ICD-10-CM | POA: Diagnosis not present

## 2023-04-21 DIAGNOSIS — F5104 Psychophysiologic insomnia: Secondary | ICD-10-CM

## 2023-04-21 DIAGNOSIS — M87051 Idiopathic aseptic necrosis of right femur: Secondary | ICD-10-CM | POA: Diagnosis not present

## 2023-04-21 DIAGNOSIS — I10 Essential (primary) hypertension: Secondary | ICD-10-CM

## 2023-04-21 DIAGNOSIS — E1169 Type 2 diabetes mellitus with other specified complication: Secondary | ICD-10-CM | POA: Diagnosis not present

## 2023-04-21 DIAGNOSIS — F429 Obsessive-compulsive disorder, unspecified: Secondary | ICD-10-CM

## 2023-04-21 DIAGNOSIS — M87052 Idiopathic aseptic necrosis of left femur: Secondary | ICD-10-CM

## 2023-04-21 DIAGNOSIS — E1165 Type 2 diabetes mellitus with hyperglycemia: Secondary | ICD-10-CM

## 2023-04-21 DIAGNOSIS — Z8673 Personal history of transient ischemic attack (TIA), and cerebral infarction without residual deficits: Secondary | ICD-10-CM

## 2023-04-21 DIAGNOSIS — E785 Hyperlipidemia, unspecified: Secondary | ICD-10-CM

## 2023-04-21 MED ORDER — LISINOPRIL 5 MG PO TABS
5.0000 mg | ORAL_TABLET | Freq: Every day | ORAL | 1 refills | Status: DC
Start: 2023-04-21 — End: 2023-06-13

## 2023-04-21 MED ORDER — SERTRALINE HCL 100 MG PO TABS
100.0000 mg | ORAL_TABLET | Freq: Every day | ORAL | 1 refills | Status: DC
Start: 2023-04-21 — End: 2023-06-13

## 2023-04-21 MED ORDER — CARVEDILOL 3.125 MG PO TABS
3.1250 mg | ORAL_TABLET | Freq: Two times a day (BID) | ORAL | 1 refills | Status: DC
Start: 2023-04-21 — End: 2023-06-13

## 2023-04-21 NOTE — Progress Notes (Signed)
Established patient visit   Patient: Gary Benson   DOB: [birth year]   62 y.o. Male  MRN: [id number] Visit Date: 04/21/2023  Today's healthcare provider: Sherlyn Hay, DO   Chief Complaint  Patient presents with   Establish Care    Patient is switching due to his PCP left the practice   Diabetes   Blood Pressure Check    Patient states he has been taking his wife's Lisinopril 5 mg for about 6 months.  He has not been prescribed any anti-hypertensive since his time when [facility] was [facility] which [facility] 8-10 years ago.  He is no longer able to take her medication because they have taken her off of it. He is not sure if he needs anything or not.   restless leg    Patient states that at times he is unable to sleep due to being restless.  He states he was at one time on a muscle relaxer to help him sleep and would like to see about getting something for this.  He states he has trouble sleeping 4 out of the 7 nights.  When he sleeps well he gets about 5-6 hours of sleep.  When not sleeping well he gets up 2-3 times a night and he will have to get up for a while before he is able to go back to sleep.   Depression    Patient was put on Sertraline in May and he states he can tell a big difference and thinks it is really helping him   Subjective    HPI HPI     Establish Care    Additional comments: Patient is switching due to his PCP left the practice        Blood Pressure Check    Additional comments: Patient states he has been taking his wife's Lisinopril 5 mg for about 6 months.  He has not been prescribed any anti-hypertensive since his time when [facility] which [facility] 8-10 years ago.  He is no longer able to take her medication because they have taken her off of it. He is not sure if he needs anything or not.        restless leg    Additional comments: Patient states that at times he is unable to sleep due to being restless.  He states  he was at one time on a muscle relaxer to help him sleep and would like to see about getting something for this.  He states he has trouble sleeping 4 out of the 7 nights.  When he sleeps well he gets about 5-6 hours of sleep.  When not sleeping well he gets up 2-3 times a night and he will have to get up for a while before he is able to go back to sleep.        Depression    Additional comments: Patient was put on Sertraline in May and he states he can tell a big difference and thinks it is really helping him        Comments   Patient was last seen in May and his A1C was Lab Results      Component                Value               Date  HGBA1C                   12.6 (H)            02/12/2023           He reports his home readings being 300-500 at the time of his last visit and now they are gradually improving with the last week's readings ranging from 60's-130'.       Last edited by Adline Peals, CMA on 04/21/2023 10:20 AM.       Primary concern: restless legs x6 months  - Trouble falling asleep and staying asleep.  - Watches TV in bed for a bit before sleeping. Does feel tired when turning it off and turning it over to sleep.  Legs are uncomfortable when they're on top of each other.  - does feel he has to move his legs to stretch them out  - has to be careful when stretching his lef tleg because he'll get a cramp Will get up and go sit at kitchen table for aboyt 5 minutes, then go back. Has to do that twice before falling asleep.  - when he wakes up in the middle of the night.   - feels OCD is doing well on sertraline  Diabetes:  - Diarrhea from metformin improved  - BGs initially 300-500s; mid-June on they have been 60-130s  Morning readings: 106-155 2pm readings: 65-115, with outlier x1 of 144 He takes his second dose of metformin after his 2 PM reading because he typically eats dinner between 2 and 5 PM.  He does not usually eat anything after this.   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   aspirin 325 MG tablet Take 1 tablet (325 mg total) by mouth daily.   Blood Glucose Monitoring Suppl (TRUE METRIX AIR GLUCOSE METER) w/Device KIT USE AS DIRECTED   EPINEPHrine 0.3 mg/0.3 mL IJ SOAJ injection Inject 0.3 mg into the muscle as needed for anaphylaxis.   famotidine (PEPCID) 20 MG tablet Take 20 mg by mouth 2 (two) times daily.   Lancet Devices (TRUEDRAW LANCING DEVICE) MISC USE AS DIRECTED   metFORMIN (GLUCOPHAGE-XR) 500 MG 24 hr tablet Take 2 tablets (1,000 mg total) by mouth 2 (two) times daily with a meal.   rosuvastatin (CRESTOR) 20 MG tablet Take 1 tablet (20 mg total) by mouth daily.   TRUE METRIX BLOOD GLUCOSE TEST test strip TEST BLOOD SUGAR IN THE MORNING, AT NOON, AND AT BEDTIME   TRUEplus Lancets 33G MISC TEST BLOOD SUGAR IN THE MORNING, AT NOON, AND AT BEDTIME   [DISCONTINUED] sertraline (ZOLOFT) 50 MG tablet Take 1 tablet (50 mg total) by mouth daily.   No facility-administered medications prior to visit.    Review of Systems  Constitutional:  Negative for appetite change, chills and fever.  HENT:  Negative for congestion.   Respiratory:  Negative for chest tightness, shortness of breath and wheezing.   Cardiovascular:  Negative for chest pain and palpitations.  Gastrointestinal:  Negative for abdominal pain, nausea and vomiting.  Endocrine: Negative for polydipsia, polyphagia and polyuria.  Psychiatric/Behavioral:  Positive for sleep disturbance.          Objective    BP 135/73 (BP Location: Left Arm, Patient Position: Sitting, Cuff Size: Normal)   Pulse  85   Temp 98.1 F (36.7 C) (Oral)   Ht 5\' 8"  (1.727 m)   Wt 155 lb (70.3 kg)   SpO2 99%   BMI 23.57 kg/m      Physical Exam Vitals and nursing note reviewed.  Constitutional:      General: He is not in acute distress.    Appearance: Normal appearance.  HENT:     Head: Normocephalic and atraumatic.  Eyes:     General: No scleral icterus.    Conjunctiva/sclera: Conjunctivae normal.  Cardiovascular:     Rate and Rhythm: Normal rate.  Pulmonary:     Effort: Pulmonary effort is normal.  Neurological:     Mental Status: He is alert and oriented to person, place, and time. Mental status is at baseline.  Psychiatric:        Mood and Affect: Mood normal.        Behavior: Behavior normal.      No results found for any visits on 04/21/23.  Assessment & Plan    Type 2 diabetes mellitus with hyperglycemia, without long-term current use of insulin (HCC) Assessment & Plan: Given the patient's episodes of hypoglycemia for his 2 PM readings, we will decrease his metformin to 1 tablet in the morning and 2 tablets with his evening meal, which he has between 2 to 5 PM.  Patient endorses he typically does not eat anything after this time.  He will be tingling to document his blood sugars and he will bring his log in on his next visit.   Hyperlipidemia associated with type 2 diabetes mellitus [facility]) Assessment & Plan: Patient restarted on rosuvastatin 02/13/2023.  Will recheck his lipid panel on his next visit and adjust dosage if indicated.   History of CVA (cerebrovascular accident)  Primary hypertension -     Lisinopril; Take 1 tablet (5 mg total) by mouth daily.  Dispense: 30 tablet; Refill: 1 -     Carvedilol; Take 1 tablet (3.125 mg total) by mouth 2 (two) times daily with a meal.  Dispense: 60 tablet; Refill: 1  History of MI (myocardial infarction) Assessment & Plan: Patient is currently on core measures of rosuvastatin and aspirin.  He has been taking his wife's lisinopril,  which is low-dose and does not fully control his blood pressure.  Will send in a prescription for him today, maintaining this dose but also starting him on carvedilol.  Recheck on follow-up appointment.  Orders: -     Lisinopril; Take 1 tablet (5 mg total) by mouth daily.  Dispense: 30 tablet; Refill: 1 -     Carvedilol; Take 1 tablet (3.125 mg total) by mouth 2 (two) times daily with a meal.  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  However, it does not completely resolve his compulsive tendencies.  Though he attributes his difficulty sleeping to his legs being uncomfortable, I suspect this is a manifestation of his OCD.  Will increase his sertraline to 100 mg and recheck on his next visit.  Orders: -     Sertraline HCl; Take 1 tablet (100 mg total) by mouth daily.  Dispense: 30 tablet; Refill:  1  Psychophysiological insomnia -     Sertraline HCl; Take 1 tablet (100 mg total) by mouth daily.  Dispense: 30 tablet; Refill: 1  Avascular necrosis of bones of both hips [facility]) Assessment & Plan: Note on record review.  Will discuss with patient on next visit.     Return in about 4 weeks (around 05/19/2023) for Anx/Dep, HTN, DM.      The entirety of the information documented in the History of Present Illness, Review of Systems and Physical Exam were personally obtained by me. Portions of this information were initially documented by the CMA, Adline Peals, and reviewed by me for thoroughness and accuracy.   I discussed the assessment and treatment plan with the patient  The patient was provided an opportunity to ask questions and all were answered. The patient agreed with the plan and demonstrated an understanding of the instructions.   The patient was advised to call back or seek an in-person evaluation if the symptoms worsen or if the condition fails to improve  as anticipated.  Total time was 40 minutes. That includes chart review before the visit, the actual patient visit, and time spent on documentation after the visit.   Sherlyn Hay, DO  [facility] [phone] (phone) [phone] (fax)  [facility]

## 2023-04-21 NOTE — Patient Instructions (Signed)
Take your metformin  1 pill and 2 in the evening

## 2023-04-24 ENCOUNTER — Encounter: Payer: Self-pay | Admitting: Family Medicine

## 2023-04-24 DIAGNOSIS — E1165 Type 2 diabetes mellitus with hyperglycemia: Secondary | ICD-10-CM | POA: Insufficient documentation

## 2023-04-24 DIAGNOSIS — M87051 Idiopathic aseptic necrosis of right femur: Secondary | ICD-10-CM | POA: Insufficient documentation

## 2023-04-24 DIAGNOSIS — F5104 Psychophysiologic insomnia: Secondary | ICD-10-CM | POA: Insufficient documentation

## 2023-04-24 NOTE — Assessment & Plan Note (Signed)
Patient is currently on core measures of rosuvastatin and aspirin.  He has been taking his wife's lisinopril, which is low-dose and does not fully control his blood pressure.  Will send in a prescription for him today, maintaining this dose but also starting him on carvedilol.  Recheck on follow-up appointment.

## 2023-04-24 NOTE — Assessment & Plan Note (Addendum)
Continue rosuvastatin and aspirin 325mg 

## 2023-04-24 NOTE — Assessment & Plan Note (Signed)
Note on record review.  Will discuss with patient on next visit.

## 2023-04-24 NOTE — Assessment & Plan Note (Signed)
Patient restarted on rosuvastatin 02/13/2023.  Will recheck his lipid panel on his next visit and adjust dosage if indicated.

## 2023-04-24 NOTE — Assessment & Plan Note (Signed)
Given the patient's episodes of hypoglycemia for his 2 PM readings, we will decrease his metformin to 1 tablet in the morning and 2 tablets with his evening meal, which he has between 2 to 5 PM.  Patient endorses he typically does not eat anything after this time.  He will be tingling to document his blood sugars and he will bring his log in on his next visit.

## 2023-04-24 NOTE — Assessment & Plan Note (Signed)
Patient has been taking sertraline for management of his OCD.  However, it does not completely resolve his compulsive tendencies.  Though he attributes his difficulty sleeping to his legs being uncomfortable, I suspect this is a manifestation of his OCD.  Will increase his sertraline to 100 mg and recheck on his next visit.

## 2023-04-30 ENCOUNTER — Ambulatory Visit: Payer: Medicare HMO | Admitting: Urology

## 2023-05-03 ENCOUNTER — Other Ambulatory Visit: Payer: Self-pay | Admitting: Physician Assistant

## 2023-05-03 DIAGNOSIS — E1165 Type 2 diabetes mellitus with hyperglycemia: Secondary | ICD-10-CM

## 2023-05-05 NOTE — Telephone Encounter (Signed)
Requested Prescriptions  Pending Prescriptions Disp Refills   glucose blood (TRUE METRIX BLOOD GLUCOSE TEST) test strip [Pharmacy Med Name: TRUE METRIX SELF MONITORING BLOOD GLUCOSE STRIPS   Strip] 300 strip 0    Sig: TEST BLOOD SUGAR IN THE MORNING, AT NOON, AND AT BEDTIME     Endocrinology: Diabetes - Testing Supplies Passed - 05/03/2023  2:10 AM      Passed - Valid encounter within last 12 months    Recent Outpatient Visits           2 weeks ago Type 2 diabetes mellitus with hyperglycemia, without long-term current use of insulin [facility])   [place] P[place], Monico Blitz, DO   2 months ago History of CVA (cerebrovascular accident)   [facility] Alfredia Ferguson, PA-C       Future Appointments             In 2 weeks Pardue, Monico Blitz, DO [place] [facility]   In 1 month Debbe Odea, MD [facility]

## 2023-05-15 ENCOUNTER — Other Ambulatory Visit: Payer: Self-pay | Admitting: Physician Assistant

## 2023-05-15 DIAGNOSIS — E1165 Type 2 diabetes mellitus with hyperglycemia: Secondary | ICD-10-CM

## 2023-05-19 ENCOUNTER — Encounter: Admission: RE | Payer: Self-pay | Source: Ambulatory Visit

## 2023-05-19 ENCOUNTER — Ambulatory Visit: Admission: RE | Admit: 2023-05-19 | Payer: Medicare HMO | Source: Ambulatory Visit | Admitting: Gastroenterology

## 2023-05-19 SURGERY — COLONOSCOPY WITH PROPOFOL
Anesthesia: General

## 2023-05-21 ENCOUNTER — Other Ambulatory Visit: Payer: Self-pay | Admitting: Physician Assistant

## 2023-05-22 ENCOUNTER — Ambulatory Visit: Payer: Medicare HMO | Admitting: Family Medicine

## 2023-05-22 ENCOUNTER — Encounter: Payer: Self-pay | Admitting: Family Medicine

## 2023-05-22 VITALS — BP 129/81 | HR 73 | Temp 98.0°F | Ht 68.0 in | Wt 155.0 lb

## 2023-05-22 DIAGNOSIS — R972 Elevated prostate specific antigen [PSA]: Secondary | ICD-10-CM

## 2023-05-22 DIAGNOSIS — I252 Old myocardial infarction: Secondary | ICD-10-CM

## 2023-05-22 DIAGNOSIS — E1169 Type 2 diabetes mellitus with other specified complication: Secondary | ICD-10-CM | POA: Diagnosis not present

## 2023-05-22 DIAGNOSIS — L84 Corns and callosities: Secondary | ICD-10-CM

## 2023-05-22 DIAGNOSIS — E1165 Type 2 diabetes mellitus with hyperglycemia: Secondary | ICD-10-CM | POA: Diagnosis not present

## 2023-05-22 DIAGNOSIS — R208 Other disturbances of skin sensation: Secondary | ICD-10-CM

## 2023-05-22 DIAGNOSIS — E785 Hyperlipidemia, unspecified: Secondary | ICD-10-CM

## 2023-05-22 DIAGNOSIS — G2581 Restless legs syndrome: Secondary | ICD-10-CM | POA: Diagnosis not present

## 2023-05-22 DIAGNOSIS — F5104 Psychophysiologic insomnia: Secondary | ICD-10-CM

## 2023-05-22 DIAGNOSIS — I10 Essential (primary) hypertension: Secondary | ICD-10-CM | POA: Diagnosis not present

## 2023-05-22 DIAGNOSIS — F429 Obsessive-compulsive disorder, unspecified: Secondary | ICD-10-CM | POA: Diagnosis not present

## 2023-05-22 DIAGNOSIS — Z720 Tobacco use: Secondary | ICD-10-CM

## 2023-05-22 HISTORY — DX: Elevated prostate specific antigen (PSA): R97.20

## 2023-05-22 MED ORDER — GABAPENTIN 300 MG PO CAPS
300.0000 mg | ORAL_CAPSULE | Freq: Three times a day (TID) | ORAL | 3 refills | Status: DC
Start: 2023-05-22 — End: 2023-07-21

## 2023-05-22 NOTE — Assessment & Plan Note (Signed)
Patient remains precontemplative at this time.

## 2023-05-22 NOTE — Assessment & Plan Note (Addendum)
POC A1c today was 7.6, which is significantly improved from his prior level of 12.6.  Encouraged patient to maintain a low carbohydrate diet.  Offered patient referral to nutrition, which he declined at this time.  Advised him that, if he changes his mind, he can let us know and we will send the referral.

## 2023-05-22 NOTE — Assessment & Plan Note (Signed)
Patient reports that he has been doing well on his blood pressure medication and is not having side effects.  Will continue management with carvedilol 3.125 mg twice daily and lisinopril 5 mg daily.

## 2023-05-22 NOTE — Telephone Encounter (Signed)
Requested Prescriptions  Pending Prescriptions Disp Refills   TRUEplus Lancets 33G MISC [Pharmacy Med Name: TRUEPLUS LANCETS 33G] 300 each 3    Sig: TEST BLOOD SUGAR IN THE MORNING, AT NOON, AND AT BEDTIME     Endocrinology: Diabetes - Testing Supplies Passed - 05/21/2023  4:57 AM      Passed - Valid encounter within last 12 months    Recent Outpatient Visits           Today Type 2 diabetes mellitus with hyperglycemia, without long-term current use of insulin [facility])   [place] [facility] P[facility]ue, Monico Blitz, DO   1 month ago Type 2 diabetes mellitus with hyperglycemia, without long-term current use of insulin [facility])    [facility] [place], Monico Blitz, DO   3 months ago History of CVA (cerebrovascular accident)   [facility] Alfredia Ferguson, PA-C       Future Appointments             In 3 weeks Agbor-Etang, Arlys John, MD [facility] at [place]   In 3 weeks [name], MD [facility]

## 2023-05-22 NOTE — Assessment & Plan Note (Addendum)
PSA 33.8 02/12/2023.  Patient had previously not followed up with urology due to rescheduling an initial appointment and then canceling the second one.  Discussed with him the importance of seeing and being evaluated by urology.  Patient expressed understanding and stated he would go to an appointment there.  I called urology and was able to get him scheduled for 06/18/2023 at 2:30 PM.

## 2023-05-22 NOTE — Assessment & Plan Note (Addendum)
Referred patient to podiatry for management of his calluses and facilitation of foot health maintenance/nail trimming, given patient's diabetes.  Encouraged him to replace his old slippers as they are likely the source of his calluses.

## 2023-05-22 NOTE — Assessment & Plan Note (Signed)
Patient's restless legs are significantly improved by the gabapentin 300 mg his sister gave him.  It does not cause him to be overly drowsy in the morning.  Will go ahead and continue him on this prescription.  Sent in as noted below.

## 2023-05-22 NOTE — Patient Instructions (Addendum)
Call [employer] and see about scheduling and coverage for diabetic eye exam soon, rather than waiting a full year from the previous one.   Heart doctor appointment: September 11 at 8:40 am   Urology appointment: September 11 at 2:30 pm It is very important that you keep this appointment!   Call to schedule colonoscopy

## 2023-05-22 NOTE — Assessment & Plan Note (Signed)
Podiatry referral sent as noted.  Counseled patient on the importance of inspecting his feet and keeping them clean, dry and in well-fitting shoes.

## 2023-05-22 NOTE — Assessment & Plan Note (Signed)
Patient is due for recheck of his lipid panel.  Will order today.  Patient will return to have fasting labs drawn.

## 2023-05-22 NOTE — Assessment & Plan Note (Signed)
Patient just started his full dose of sertraline 100 mg daily around a week ago, after taking 1.5 tablets of his 50 mg sertraline daily until he ran out.  He states that he has been doing well on the new dose, and his OCD tendencies are well-controlled.

## 2023-05-22 NOTE — Progress Notes (Signed)
Established patient visit   Patient: Gary Benson   DOB: [birth year]   62 y.o. Male  MRN: [id number] Visit Date: 05/22/2023  Today's healthcare provider: Sherlyn Hay, DO   Chief Complaint  Patient presents with   Diabetes    Patient was last seen on 04/08/23.  At that time he reported having glucose readings that had been low.  For the few months prior to that visit he had readings running 300-500.  Adjustment in medication was made to reflect taking 1 Metformin in the am and 2 in the afternoon.  His readings have improved and reveal fasting readings of 120's-140's and in the afternoon 79-91.   Hypertension    Patient had been taking his wife's Lisinopril for his blood pressure and has since filled his prescription.   Depression    Patient was on Sertraline 50 mg daily and titrated up to 100 mg daily.  He has not yet been on the 100 mg daily for a couple of weeks.     Insomnia    Patient reports he had not been sleeping well and has restless leg at night.  He reports his sister has given his Gabapentin 300 mg to take 1 at night.  He states that since doing this he has been sleeping much better and not having any jerky movements any more.   Subjective    HPI Complaints/concerns today as noted above.  He states that the gabapentin 300 mg capsule his sister gave him does not cause him to be drowsy.     Medications: Outpatient Medications Prior to Visit  Medication Sig   acetaminophen (TYLENOL) 500 MG tablet Take 1,000 mg by mouth every morning.   aspirin 325 MG tablet Take 1 tablet (325 mg total) by mouth daily.   Blood Glucose Monitoring Suppl (TRUE METRIX AIR GLUCOSE METER) w/Device KIT USE AS DIRECTED   carvedilol (COREG) 3.125 MG tablet Take 1 tablet (3.125 mg total) by mouth 2 (two) times daily with a meal.   EPINEPHrine 0.3 mg/0.3 mL IJ SOAJ injection Inject 0.3 mg into the muscle as needed for anaphylaxis.   famotidine (PEPCID) 20 MG tablet Take 20 mg by mouth 2  (two) times daily.   glucose blood (TRUE METRIX BLOOD GLUCOSE TEST) test strip TEST BLOOD SUGAR IN THE MORNING, AT NOON, AND AT BEDTIME   Lancet Devices (TRUEDRAW LANCING DEVICE) MISC USE AS DIRECTED   lisinopril (ZESTRIL) 5 MG tablet Take 1 tablet (5 mg total) by mouth daily.   metFORMIN (GLUCOPHAGE-XR) 500 MG 24 hr tablet TAKE 2 TABLETS TWICE DAILY WITH MEALS   rosuvastatin (CRESTOR) 20 MG tablet Take 1 tablet (20 mg total) by mouth daily.   sertraline (ZOLOFT) 100 MG tablet Take 1 tablet (100 mg total) by mouth daily.   [DISCONTINUED] TRUEplus Lancets 33G MISC TEST BLOOD SUGAR IN THE MORNING, AT NOON, AND AT BEDTIME   No facility-administered medications prior to visit.    Review of Systems  Constitutional:  Negative for chills, fatigue and fever.  Eyes:  Negative for visual disturbance.  Respiratory: Negative.  Negative for cough, shortness of breath and wheezing.   Cardiovascular:  Negative for chest pain, palpitations and leg swelling.  Endocrine: Negative for polydipsia, polyphagia and polyuria.  Neurological:  Negative for weakness, numbness and headaches.       Burning sensation to plantar aspects of feet  Psychiatric/Behavioral:  Negative for sleep disturbance. The patient is not nervous/anxious.  Objective    BP 129/81 (BP Location: Right Arm, Patient Position: Sitting, Cuff Size: Normal)   Pulse 73   Temp 98 F (36.7 C) (Oral)   Ht 5\' 8"  (1.727 m)   Wt 155 lb (70.3 kg)   SpO2 99%   BMI 23.57 kg/m     Physical Exam Vitals and nursing note reviewed.  Constitutional:      General: He is not in acute distress.    Appearance: Normal appearance.  HENT:     Head: Normocephalic and atraumatic.  Eyes:     General: No scleral icterus.    Conjunctiva/sclera: Conjunctivae normal.  Cardiovascular:     Rate and Rhythm: Normal rate.     Pulses:          Dorsalis pedis pulses are 2+ on the right side and 2+ on the left side.       Posterior tibial pulses are 2+  on the right side and 2+ on the left side.  Pulmonary:     Effort: Pulmonary effort is normal.  Musculoskeletal:       Feet:  Feet:     Right foot:     Protective Sensation: 10 sites tested.  10 sites sensed.     Skin integrity: Callus present. No ulcer, blister, erythema or warmth.     Toenail Condition: Right toenails are long.     Left foot:     Protective Sensation: 10 sites tested.  10 sites sensed.     Skin integrity: No ulcer, blister, erythema, warmth or callus.     Toenail Condition: Left toenails are long.  Neurological:     Mental Status: He is alert and oriented to person, place, and time. Mental status is at baseline.  Psychiatric:        Mood and Affect: Mood normal.        Behavior: Behavior normal.      Results for orders placed or performed in visit on 05/22/23  Microalbumin / creatinine urine ratio  Result Value Ref Range   Creatinine, Urine WILL FOLLOW    Microalbumin, Urine WILL FOLLOW    Microalb/Creat Ratio WILL FOLLOW   Comprehensive metabolic panel  Result Value Ref Range   Glucose 127 (H) 70 - 99 mg/dL   BUN 5 (L) 8 - 27 mg/dL   Creatinine, Ser 0.86 0.76 - 1.27 mg/dL   eGFR 97 >57 QI/ONG/2.95   BUN/Creatinine Ratio 6 (L) 10 - 24   Sodium 139 134 - 144 mmol/L   Potassium 2.9 (L) 3.5 - 5.2 mmol/L   Chloride 99 96 - 106 mmol/L   CO2 25 20 - 29 mmol/L   Calcium 9.2 8.6 - 10.2 mg/dL   Total Protein 6.8 6.0 - 8.5 g/dL   Albumin 4.3 3.9 - 4.9 g/dL   Globulin, Total 2.5 1.5 - 4.5 g/dL   Bilirubin Total 0.3 0.0 - 1.2 mg/dL   Alkaline Phosphatase 95 44 - 121 IU/L   AST 16 0 - 40 IU/L   ALT 19 0 - 44 IU/L  Lipid panel  Result Value Ref Range   Cholesterol, Total 96 (L) 100 - 199 mg/dL   Triglycerides 284 0 - 149 mg/dL   HDL 37 (L) >13 mg/dL   VLDL Cholesterol Cal 22 5 - 40 mg/dL   LDL Chol Calc (NIH) 37 0 - 99 mg/dL   Chol/HDL Ratio 2.6 0.0 - 5.0 ratio  POCT glycosylated hemoglobin (Hb A1C)  Result Value Ref Range   Hemoglobin  A1C 7.6 (A) 4.0 -  5.6 %   HbA1c POC (<> result, manual entry)     HbA1c, POC (prediabetic range)     HbA1c, POC (controlled diabetic range)      Assessment & Plan    Type 2 diabetes mellitus with hyperglycemia, without long-term current use of insulin (HCC) Assessment & Plan: POC A1c today was 7.6, which is significantly improved from his prior level of 12.6.  Encouraged patient to maintain a low carbohydrate diet.  Offered patient referral to nutrition, which he declined at this time.  Advised him that, if he changes his mind, he can let us know and we will send the referral.  Orders: -     POCT glycosylated hemoglobin (Hb A1C) -     Microalbumin / creatinine urine ratio -     Ambulatory referral to Podiatry  Hyperlipidemia associated with type 2 diabetes mellitus (HCC) Assessment & Plan: Patient is due for recheck of his lipid panel.  Will order today.  Patient will return to have fasting labs drawn.  Orders: -     Lipid panel  Primary hypertension Assessment & Plan: Patient reports that he has been doing well on his blood pressure medication and is not having side effects.  Will continue management with carvedilol 3.125 mg twice daily and lisinopril 5 mg daily.   History of MI (myocardial infarction) Assessment & Plan: No acute concerns.  Will continue patient on aspirin 325 mg daily, carvedilol 3.125 mg twice daily, lisinopril 5 mg daily and rosuvastatin 20 mg nightly.  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  He states that he has been doing well on the new dose, and his OCD tendencies are well-controlled.   Psychophysiological insomnia Assessment & Plan: Significantly improved since starting sertraline 100 mg daily.  No changes to plan today.  Orders: -      Gabapentin; Take 1 capsule (300 mg total) by mouth 3 (three) times daily.  Dispense: 90 capsule; Refill: 3  Elevated PSA, greater than or equal to 20 ng/ml Assessment & Plan: PSA 33.8 02/12/2023.  Patient had previously not followed up with urology due to rescheduling an initial appointment and then canceling the second one.  Discussed with him the importance of seeing and being evaluated by urology.  Patient expressed understanding and stated he would go to an appointment there.  I called urology and was able to get him scheduled for 06/18/2023 at 2:30 PM.    Restless legs Assessment & Plan: Patient's restless legs are significantly improved by the gabapentin 300 mg his sister gave him.  It does not cause him to be overly drowsy in the morning.  Will go ahead and continue him on this prescription.  Sent in as noted below.  Orders: -     Gabapentin; Take 1 capsule (300 mg total) by mouth 3 (three) times daily.  Dispense: 90 capsule; Refill: 3  Callus of foot Assessment & Plan: Referred patient to podiatry for management of his calluses and facilitation of foot health maintenance/nail trimming, given patient's diabetes.  Encouraged him to replace his old slippers as they are likely the source of his calluses.  Orders: -     Ambulatory referral to Podiatry  Decreased sensation of foot Assessment &  Plan: Podiatry referral sent as noted.  Counseled patient on the importance of inspecting his feet and keeping them clean, dry and in well-fitting shoes.    Orders: -     Ambulatory referral to Podiatry  Tobacco use Assessment & Plan: Patient remains precontemplative at this time.    Return in about 3 months (around 08/22/2023) for DM.      I discussed the assessment and treatment plan with the patient  The patient was provided an opportunity to ask questions and all were answered. The patient agreed with the plan and demonstrated an understanding of the instructions.   The patient  was advised to call back or seek an in-person evaluation if the symptoms worsen or if the condition fails to improve as anticipated.  Total time was 60 minutes. That includes chart review before the visit, the actual patient visit, and time spent on documentation after the visit.    Sherlyn Hay, DO  [facility] [phone] (phone) [phone] (fax)  [facility]

## 2023-05-22 NOTE — Assessment & Plan Note (Addendum)
No acute concerns.  Will continue patient on aspirin 325 mg daily, carvedilol 3.125 mg twice daily, lisinopril 5 mg daily and rosuvastatin 20 mg nightly.

## 2023-05-22 NOTE — Assessment & Plan Note (Signed)
Significantly improved since starting sertraline 100 mg daily.  No changes to plan today.

## 2023-05-27 DIAGNOSIS — E785 Hyperlipidemia, unspecified: Secondary | ICD-10-CM | POA: Diagnosis not present

## 2023-05-27 DIAGNOSIS — I252 Old myocardial infarction: Secondary | ICD-10-CM | POA: Diagnosis not present

## 2023-05-27 DIAGNOSIS — E1165 Type 2 diabetes mellitus with hyperglycemia: Secondary | ICD-10-CM | POA: Diagnosis not present

## 2023-05-27 DIAGNOSIS — E1169 Type 2 diabetes mellitus with other specified complication: Secondary | ICD-10-CM | POA: Diagnosis not present

## 2023-05-28 LAB — COMPREHENSIVE METABOLIC PANEL
ALT: 19 IU/L (ref 0–44)
AST: 16 IU/L (ref 0–40)
Albumin: 4.3 g/dL (ref 3.9–4.9)
Alkaline Phosphatase: 95 IU/L (ref 44–121)
BUN/Creatinine Ratio: 6 — ABNORMAL LOW (ref 10–24)
BUN: 5 mg/dL — ABNORMAL LOW (ref 8–27)
Bilirubin Total: 0.3 mg/dL (ref 0.0–1.2)
CO2: 25 mmol/L (ref 20–29)
Calcium: 9.2 mg/dL (ref 8.6–10.2)
Chloride: 99 mmol/L (ref 96–106)
Creatinine, Ser: 0.89 mg/dL (ref 0.76–1.27)
Globulin, Total: 2.5 g/dL (ref 1.5–4.5)
Glucose: 127 mg/dL — ABNORMAL HIGH (ref 70–99)
Potassium: 2.9 mmol/L — ABNORMAL LOW (ref 3.5–5.2)
Sodium: 139 mmol/L (ref 134–144)
Total Protein: 6.8 g/dL (ref 6.0–8.5)
eGFR: 97 mL/min/{1.73_m2} (ref >59)

## 2023-05-28 LAB — POCT GLYCOSYLATED HEMOGLOBIN (HGB A1C): Hemoglobin A1C: 7.6 % — AB (ref 4.0–5.6)

## 2023-05-28 LAB — MICROALBUMIN / CREATININE URINE RATIO
Creatinine, Urine: 126.1 mg/dL
Microalb/Creat Ratio: 44 mg/g{creat} — ABNORMAL HIGH (ref 0–29)
Microalbumin, Urine: 56 ug/mL

## 2023-05-28 LAB — LIPID PANEL
Chol/HDL Ratio: 2.6 ratio (ref 0.0–5.0)
Cholesterol, Total: 96 mg/dL — ABNORMAL LOW (ref 100–199)
HDL: 37 mg/dL — ABNORMAL LOW (ref >39)
LDL Chol Calc (NIH): 37 mg/dL (ref 0–99)
Triglycerides: 121 mg/dL (ref 0–149)
VLDL Cholesterol Cal: 22 mg/dL (ref 5–40)

## 2023-06-04 ENCOUNTER — Other Ambulatory Visit: Payer: Self-pay | Admitting: Family Medicine

## 2023-06-04 DIAGNOSIS — E876 Hypokalemia: Secondary | ICD-10-CM

## 2023-06-04 MED ORDER — POTASSIUM CHLORIDE ER 20 MEQ PO TBCR
1.0000 | EXTENDED_RELEASE_TABLET | Freq: Every day | ORAL | 0 refills | Status: DC
Start: 2023-06-04 — End: 2023-07-21

## 2023-06-13 ENCOUNTER — Other Ambulatory Visit: Payer: Self-pay | Admitting: Family Medicine

## 2023-06-13 DIAGNOSIS — I10 Essential (primary) hypertension: Secondary | ICD-10-CM

## 2023-06-13 DIAGNOSIS — F429 Obsessive-compulsive disorder, unspecified: Secondary | ICD-10-CM

## 2023-06-13 DIAGNOSIS — I252 Old myocardial infarction: Secondary | ICD-10-CM

## 2023-06-13 DIAGNOSIS — F5104 Psychophysiologic insomnia: Secondary | ICD-10-CM

## 2023-06-16 DIAGNOSIS — E876 Hypokalemia: Secondary | ICD-10-CM | POA: Diagnosis not present

## 2023-06-17 LAB — BASIC METABOLIC PANEL
BUN/Creatinine Ratio: 10 (ref 10–24)
BUN: 8 mg/dL (ref 8–27)
CO2: 21 mmol/L (ref 20–29)
Calcium: 9.4 mg/dL (ref 8.6–10.2)
Chloride: 101 mmol/L (ref 96–106)
Creatinine, Ser: 0.81 mg/dL (ref 0.76–1.27)
Glucose: 114 mg/dL — ABNORMAL HIGH (ref 70–99)
Potassium: 3.6 mmol/L (ref 3.5–5.2)
Sodium: 139 mmol/L (ref 134–144)
eGFR: 100 mL/min/{1.73_m2} (ref >59)

## 2023-06-18 ENCOUNTER — Ambulatory Visit: Payer: Disability Insurance | Admitting: Cardiology

## 2023-06-18 ENCOUNTER — Ambulatory Visit: Payer: Medicare HMO | Admitting: Urology

## 2023-06-18 VITALS — BP 135/79 | HR 81 | Ht 69.0 in | Wt 155.2 lb

## 2023-06-18 DIAGNOSIS — R972 Elevated prostate specific antigen [PSA]: Secondary | ICD-10-CM

## 2023-06-18 NOTE — Progress Notes (Signed)
Gary Benson,acting as a scribe for Gary Scotland, MD.,have documented all relevant documentation on the behalf of Gary Scotland, MD,as directed by  Gary Scotland, MD while in the presence of Gary Scotland, MD.  06/18/2023 3:41 PM   Gary Benson 1960-12-04 [id number]  Referring provider: Alfredia Ferguson, PA-C [address] [place],  [place] [address]  Chief Complaint  Patient presents with   Establish Care   Elevated PSA    HPI: 62 year-old male who is referred for further evaluation of elevated PSA. He was referred in May; canceled that appt but advised to reschedule by his PCP.  His most recent PSA on 02/12/2023 was 33.8.   He has had no recent cross-sectional imaging. He has no previous PSA's for comparison.  Today, he reports nocturia x3. He denies any hematuria or hematospermia. He denies any unexplained weight loss or new aches and pains.    PMH: Past Medical History:  Diagnosis Date   CHF (congestive heart failure) (HCC)    COPD (chronic obstructive pulmonary disease) (HCC)    GERD (gastroesophageal reflux disease)    Hypertension    Myocardial infarction (HCC)    Stroke [facility])     Surgical History: Past Surgical History:  Procedure Laterality Date   CAROTID STENT     CORONARY ANGIOPLASTY WITH STENT PLACEMENT     EYE SURGERY     HERNIA REPAIR Right     Home Medications:  Allergies as of 06/18/2023       Reactions   Bee Venom Anaphylaxis        Medication List        Accurate as of June 18, 2023  3:41 PM. If you have any questions, ask your nurse or doctor.          acetaminophen 500 MG tablet Commonly known as: TYLENOL Take 1,000 mg by mouth every morning.   aspirin 325 MG tablet Take 1 tablet (325 mg total) by mouth daily.   carvedilol 3.125 MG tablet Commonly known as: COREG TAKE 1 TABLET TWICE DAILY WITH MEALS   EPINEPHrine 0.3 mg/0.3 mL Soaj injection Commonly known as: EPI-PEN Inject 0.3 mg into the  muscle as needed for anaphylaxis.   famotidine 20 MG tablet Commonly known as: PEPCID Take 20 mg by mouth 2 (two) times daily.   gabapentin 300 MG capsule Commonly known as: NEURONTIN Take 1 capsule (300 mg total) by mouth 3 (three) times daily.   lisinopril 5 MG tablet Commonly known as: ZESTRIL TAKE 1 TABLET EVERY DAY   metFORMIN 500 MG 24 hr tablet Commonly known as: GLUCOPHAGE-XR TAKE 2 TABLETS TWICE DAILY WITH MEALS   Potassium Chloride ER 20 MEQ Tbcr Take 1 tablet (20 mEq total) by mouth daily.   rosuvastatin 20 MG tablet Commonly known as: Crestor Take 1 tablet (20 mg total) by mouth daily.   sertraline 100 MG tablet Commonly known as: ZOLOFT TAKE 1 TABLET EVERY DAY   True Metrix Air Glucose Meter w/Device Kit USE AS DIRECTED   True Metrix Blood Glucose Test test strip Generic drug: glucose blood TEST BLOOD SUGAR IN THE MORNING, AT NOON, AND AT BEDTIME   TRUEdraw Lancing Device Misc USE AS DIRECTED   TRUEplus Lancets 33G Misc TEST BLOOD SUGAR IN THE MORNING, AT NOON, AND AT BEDTIME        Allergies:  Allergies  Allergen Reactions   Bee Venom Anaphylaxis    Family History: Family History  Problem Relation Age  of Onset   Diabetes Mother    Heart disease Father    Diabetes Sister     Social History:  reports that he has been smoking cigarettes. He has never used smokeless tobacco. He reports that he does not drink alcohol and does not use drugs.   Physical Exam: BP 135/79   Pulse 81   Ht 5\' 9"  (1.753 m)   Wt 155 lb 4 oz (70.4 kg)   BMI 22.93 kg/m   Constitutional:  Alert and oriented, No acute distress. HEENT: [place] AT, moist mucus membranes.  Trachea midline, no masses. ZO:XWRUEA enlarged prostate with firm induration at the right base. No discrete nodules felt. Neurologic: Grossly intact, no focal deficits, moving all 4 extremities. Psychiatric: Normal mood and affect.   Assessment & Plan:    1. Elevated PSA/ abnormal rectal exam -  Recent PSA of 33.8 - Repeat PSA and obtain a urine sample today to confirm the initial findings. - Schedule a prostate biopsy; We discussed prostate biopsy in detail including the procedure itself, the risks of blood in the urine, stool, and ejaculate, serious infection, and discomfort. He is willing to proceed with this as discussed. - Discuss the potential need for staging imaging if biopsy confirms prostate cancer. - Provide antibiotics prophylactically during the biopsy to reduce the risk of infection.   Return for prostate biopsy.  I have reviewed the above documentation for accuracy and completeness, and I agree with the above.   Gary Scotland, MD   [facility] [address] [place], [place] [address] [phone]

## 2023-06-18 NOTE — Patient Instructions (Signed)
Prostate Biopsy Instructions  Stop all aspirin or blood thinners (aspirin, plavix, coumadin, warfarin, motrin, ibuprofen, advil, aleve, naproxen, naprosyn) for 7 days prior to the procedure.  If you have any questions about stopping these medications, please contact your primary care physician or cardiologist.  Having a light meal prior to the procedure is recommended.  If you are diabetic or have low blood sugar please bring a small snack or glucose tablet.  A Fleets enema is needed to be purchased over the counter at a local pharmacy and used 2 hours before you scheduled appointment.  This can be purchased over the counter at any pharmacy.  Antibiotics will be administered in the clinic at the time of the procedure unless otherwise specified.    Please bring someone with you to the procedure to drive you home.  A follow up appointment has been scheduled for you to receive the results of the biopsy.  If you have any questions or concerns, please feel free to call the office at [phone] or send a Mychart message.    Thank you, Staff at [facility]  

## 2023-06-19 ENCOUNTER — Other Ambulatory Visit: Payer: Self-pay

## 2023-06-19 DIAGNOSIS — R8290 Unspecified abnormal findings in urine: Secondary | ICD-10-CM

## 2023-06-19 LAB — URINALYSIS, COMPLETE
Bilirubin, UA: NEGATIVE
Glucose, UA: NEGATIVE
Ketones, UA: NEGATIVE
Nitrite, UA: NEGATIVE
Protein,UA: NEGATIVE
RBC, UA: NEGATIVE
Specific Gravity, UA: <1.005 — ABNORMAL LOW (ref 1.005–1.030)
Urobilinogen, Ur: 0.2 mg/dL (ref 0.2–1.0)
pH, UA: 6 (ref 5.0–7.5)

## 2023-06-19 LAB — PSA: Prostate Specific Ag, Serum: 29.6 ng/mL — ABNORMAL HIGH (ref 0.0–4.0)

## 2023-06-19 LAB — MICROSCOPIC EXAMINATION

## 2023-06-25 ENCOUNTER — Ambulatory Visit: Payer: Medicare HMO | Admitting: Podiatry

## 2023-06-26 ENCOUNTER — Other Ambulatory Visit: Payer: Medicare HMO

## 2023-06-26 ENCOUNTER — Other Ambulatory Visit: Payer: Self-pay | Admitting: Family Medicine

## 2023-06-26 DIAGNOSIS — E1165 Type 2 diabetes mellitus with hyperglycemia: Secondary | ICD-10-CM

## 2023-06-26 DIAGNOSIS — R8290 Unspecified abnormal findings in urine: Secondary | ICD-10-CM | POA: Diagnosis not present

## 2023-06-27 NOTE — Telephone Encounter (Signed)
Requested Prescriptions  Pending Prescriptions Disp Refills   metFORMIN (GLUCOPHAGE-XR) 500 MG 24 hr tablet [Pharmacy Med Name: metFORMIN HCl ER Oral Tablet Extended Release 24 Hour 500 MG] 360 tablet 1    Sig: TAKE 2 TABLETS TWICE A DAY WITH MEALS     Endocrinology:  Diabetes - Biguanides Failed - 06/26/2023  2:53 AM      Failed - B12 Level in normal range and within 720 days    Vitamin B-12  Date Value Ref Range Status  10/14/2016 242 180 - 914 pg/mL Final    Comment:    (NOTE) This assay is not validated for testing neonatal or myeloproliferative syndrome specimens for Vitamin B12 levels. Performed at [facility]          Passed - Cr in normal range and within 360 days    Creatinine  Date Value Ref Range Status  10/09/2014 1.04 0.60 - 1.30 mg/dL Final   Creatinine, Ser  Date Value Ref Range Status  06/16/2023 0.81 0.76 - 1.27 mg/dL Final         Passed - HBA1C is between 0 and 7.9 and within 180 days    Hemoglobin A1C  Date Value Ref Range Status  05/28/2023 7.6 (A) 4.0 - 5.6 % Final  08/16/2015 5.9  Final   Hgb A1c MFr Bld  Date Value Ref Range Status  02/12/2023 12.6 (H) 4.8 - 5.6 % Final    Comment:             Prediabetes: 5.7 - 6.4          Diabetes: >6.4          Glycemic control for adults with diabetes: <7.0          Passed - eGFR in normal range and within 360 days    EGFR (African American)  Date Value Ref Range Status  10/09/2014 >60 >104mL/min Final  06/03/2014 >60  Final   GFR calc Af Amer  Date Value Ref Range Status  10/14/2016 >60 >60 mL/min Final    Comment:    (NOTE) The eGFR has been calculated using the CKD EPI equation. This calculation has not been validated in all clinical situations. eGFR's persistently <60 mL/min signify possible Chronic Kidney Disease.    EGFR (Non-African Amer.)  Date Value Ref Range Status  10/09/2014 >60 >27mL/min Final    Comment:    eGFR values <61mL/min/1.73 m2 may be an indication of  chronic kidney disease (CKD). Calculated eGFR, using the MRDR Study equation, is useful in  patients with stable renal function. The eGFR calculation will not be reliable in acutely ill patients when serum creatinine is changing rapidly. It is not useful in patients on dialysis. The eGFR calculation may not be applicable to patients at the low and high extremes of body sizes, pregnant women, and vegetarians.   06/03/2014 >60  Final    Comment:    eGFR values <70mL/min/1.73 m2 may be an indication of chronic kidney disease (CKD). Calculated eGFR is useful in patients with stable renal function. The eGFR calculation will not be reliable in acutely ill patients when serum creatinine is changing rapidly. It is not useful in  patients on dialysis. The eGFR calculation may not be applicable to patients at the low and high extremes of body sizes, pregnant women, and vegetarians.    GFR calc non Af Amer  Date Value Ref Range Status  10/14/2016 >60 >60 mL/min Final   eGFR  Date Value Ref  Range Status  06/16/2023 100 >59 mL/min/1.73 Final         Passed - Valid encounter within last 6 months    Recent Outpatient Visits           1 month ago Type 2 diabetes mellitus with hyperglycemia, without long-term current use of insulin (HCC)   [place] [facility] [place], Monico Blitz, DO   2 months ago Type 2 diabetes mellitus with hyperglycemia, without long-term current use of insulin (HCC)   [place] [facility] [place], Monico Blitz, DO   4 months ago History of CVA (cerebrovascular accident)   [place] [facility] Alfredia Ferguson, PA-C       Future Appointments             In 3 weeks [name], MD [facility] [place]   In 1 month [place], Monico Blitz, DO [place] [facility]            Passed - CBC within normal limits and completed in the last 12 months    WBC  Date Value Ref Range Status   02/12/2023 8.2 3.4 - 10.8 x10E3/uL Final  10/14/2016 8.6 3.8 - 10.6 K/uL Final   RBC  Date Value Ref Range Status  02/12/2023 4.86 4.14 - 5.80 x10E6/uL Final  10/14/2016 4.66 4.40 - 5.90 MIL/uL Final   Hemoglobin  Date Value Ref Range Status  02/12/2023 14.2 13.0 - 17.7 g/dL Final   Hematocrit  Date Value Ref Range Status  02/12/2023 44.6 37.5 - 51.0 % Final   MCHC  Date Value Ref Range Status  02/12/2023 31.8 31.5 - 35.7 g/dL Final  09/81/1914 78.2 32.0 - 36.0 g/dL Final   [facility]  Date Value Ref Range Status  02/12/2023 29.2 26.6 - 33.0 pg Final  10/14/2016 31.5 26.0 - 34.0 pg Final   MCV  Date Value Ref Range Status  02/12/2023 92 79 - 97 fL Final  10/09/2014 92 80 - 100 fL Final   No results found for: "PLTCOUNTKUC", "LABPLAT", "POCPLA" RDW  Date Value Ref Range Status  02/12/2023 12.1 11.6 - 15.4 % Final  10/09/2014 13.9 11.5 - 14.5 % Final

## 2023-06-29 LAB — CULTURE, URINE COMPREHENSIVE

## 2023-07-11 ENCOUNTER — Ambulatory Visit: Payer: Medicare HMO | Admitting: Family Medicine

## 2023-07-11 NOTE — Progress Notes (Unsigned)
Annual Wellness Visit     Patient: Gary Benson, Male    DOB: 1960/10/31, 62 y.o.   MRN: [id number] Visit Date: 07/11/2023  Today's Provider: Sherlyn Hay, DO   No chief complaint on file.  Subjective    Gary Benson is a 62 y.o. male who presents today for his Annual Wellness Visit. He reports consuming a {diet types:17450} diet. {Exercise:19826} He generally feels {well/fairly well/poorly:18703}. He reports sleeping {well/fairly well/poorly:18703}. He {does/does not:200015} have additional problems to discuss today.   HPI NEW   Medications: Outpatient Medications Prior to Visit  Medication Sig   acetaminophen (TYLENOL) 500 MG tablet Take 1,000 mg by mouth every morning.   aspirin 325 MG tablet Take 1 tablet (325 mg total) by mouth daily.   Blood Glucose Monitoring Suppl (TRUE METRIX AIR GLUCOSE METER) w/Device KIT USE AS DIRECTED   carvedilol (COREG) 3.125 MG tablet TAKE 1 TABLET TWICE DAILY WITH MEALS   EPINEPHrine 0.3 mg/0.3 mL IJ SOAJ injection Inject 0.3 mg into the muscle as needed for anaphylaxis.   famotidine (PEPCID) 20 MG tablet Take 20 mg by mouth 2 (two) times daily.   gabapentin (NEURONTIN) 300 MG capsule Take 1 capsule (300 mg total) by mouth 3 (three) times daily.   glucose blood (TRUE METRIX BLOOD GLUCOSE TEST) test strip TEST BLOOD SUGAR IN THE MORNING, AT NOON, AND AT BEDTIME   Lancet Devices (TRUEDRAW LANCING DEVICE) MISC USE AS DIRECTED   lisinopril (ZESTRIL) 5 MG tablet TAKE 1 TABLET EVERY DAY   metFORMIN (GLUCOPHAGE-XR) 500 MG 24 hr tablet TAKE 2 TABLETS TWICE A DAY WITH MEALS   Potassium Chloride ER 20 MEQ TBCR Take 1 tablet (20 mEq total) by mouth daily.   rosuvastatin (CRESTOR) 20 MG tablet Take 1 tablet (20 mg total) by mouth daily.   sertraline (ZOLOFT) 100 MG tablet TAKE 1 TABLET EVERY DAY   TRUEplus Lancets 33G MISC TEST BLOOD SUGAR IN THE MORNING, AT NOON, AND AT BEDTIME   No facility-administered medications prior to visit.     Allergies  Allergen Reactions   Bee Venom Anaphylaxis    Patient Care Team: Sherlyn Hay, DO as PCP - General (Family Medicine)  Review of Systems  {Insert previous labs (optional):23779} {See past labs  Heme  Chem  Endocrine  Serology  Results Review (optional):1}    Objective    Vitals: There were no vitals taken for this visit. {Insert last BP/Wt (optional):23777}{See vitals history (optional):1}    Physical Exam ***  Most recent functional status assessment:    04/21/2023   10:20 AM  In your present state of health, do you have any difficulty performing the following activities:  Hearing? 0  Vision? 0  Difficulty concentrating or making decisions? 0  Walking or climbing stairs? 0  Dressing or bathing? 0  Doing errands, shopping? 0   Most recent fall risk assessment:    04/21/2023   10:19 AM  Fall Risk   Falls in the past year? 0  Number falls in past yr: 0  Injury with Fall? 0    Most recent depression screenings:    04/21/2023   10:19 AM 02/12/2023   10:25 AM  PHQ 2/9 Scores  PHQ - 2 Score 0 0  PHQ- 9 Score 1 0   Most recent cognitive screening:     No data to display         Most recent Audit-C alcohol use screening    04/21/2023  10:20 AM  Alcohol Use Disorder Test (AUDIT)  1. How often do you have a drink containing alcohol? 0  2. How many drinks containing alcohol do you have on a typical day when you are drinking? 0  3. How often do you have six or more drinks on one occasion? 0  AUDIT-C Score 0   A score of 3 or more in women, and 4 or more in men indicates increased risk for alcohol abuse, EXCEPT if all of the points are from question 1   No results found for any visits on 07/11/23.  Assessment & Plan     Annual wellness visit done today including the all of the following: Reviewed patient's Family Medical History Reviewed and updated list of patient's medical providers Assessment of cognitive impairment was  done Assessed patient's functional ability Established a written schedule for health screening services Health Risk Assessent Completed and Reviewed  Exercise Activities and Dietary recommendations  Goals   None     Immunization History  Administered Date(s) Administered   Influenza,inj,Quad PF,6+ Mos 09/22/2016   Pneumococcal Polysaccharide-23 09/22/2016   Tdap 07/07/2015    Health Maintenance  Topic Date Due   Medicare Annual Wellness (AWV)  Never done   OPHTHALMOLOGY EXAM  Never done   Colonoscopy  Never done   Zoster Vaccines- Shingrix (1 of 2) Never done   Lung Cancer Screening  12/22/2013   COVID-19 Vaccine (1 - 2023-24 season) Never done   INFLUENZA VACCINE  01/05/2024 (Originally 05/08/2023)   HEMOGLOBIN A1C  11/28/2023   FOOT EXAM  05/21/2024   Diabetic kidney evaluation - Urine ACR  05/26/2024   Diabetic kidney evaluation - eGFR measurement  06/15/2024   DTaP/Tdap/Td (2 - Td or Tdap) 07/06/2025   HPV VACCINES  Aged Out   Hepatitis C Screening  Discontinued   HIV Screening  Discontinued     Discussed health benefits of physical activity, and encouraged him to engage in regular exercise appropriate for his age and condition.    There are no diagnoses linked to this encounter.     ***  No follow-ups on file.     I discussed the assessment and treatment plan with the patient  The patient was provided an opportunity to ask questions and all were answered. The patient agreed with the plan and demonstrated an understanding of the instructions.   The patient was advised to call back or seek an in-person evaluation if the symptoms worsen or if the condition fails to improve as anticipated.    Sherlyn Hay, DO  [facility] [phone] (phone) [phone] (fax)  [facility]

## 2023-07-16 ENCOUNTER — Ambulatory Visit: Payer: Medicare HMO | Admitting: Urology

## 2023-07-16 VITALS — BP 155/80 | HR 83

## 2023-07-16 DIAGNOSIS — Z2989 Encounter for other specified prophylactic measures: Secondary | ICD-10-CM | POA: Diagnosis not present

## 2023-07-16 DIAGNOSIS — C61 Malignant neoplasm of prostate: Secondary | ICD-10-CM | POA: Diagnosis not present

## 2023-07-16 DIAGNOSIS — R8290 Unspecified abnormal findings in urine: Secondary | ICD-10-CM

## 2023-07-16 DIAGNOSIS — N4232 Atypical small acinar proliferation of prostate: Secondary | ICD-10-CM | POA: Diagnosis not present

## 2023-07-16 DIAGNOSIS — R972 Elevated prostate specific antigen [PSA]: Secondary | ICD-10-CM

## 2023-07-16 MED ORDER — GENTAMICIN SULFATE 40 MG/ML IJ SOLN
80.0000 mg | Freq: Once | INTRAMUSCULAR | Status: AC
Start: 2023-07-16 — End: 2023-07-16
  Administered 2023-07-16: 80 mg via INTRAMUSCULAR

## 2023-07-16 MED ORDER — LEVOFLOXACIN 500 MG PO TABS
500.0000 mg | ORAL_TABLET | Freq: Once | ORAL | Status: AC
Start: 2023-07-16 — End: 2023-07-16
  Administered 2023-07-16: 500 mg via ORAL

## 2023-07-16 NOTE — Progress Notes (Unsigned)
   07/16/23  CC:  Chief Complaint  Patient presents with   Prostate Biopsy    HPI: 62 year old male reportedly elevated PSA up to 6 who presents today for prostate biopsy.  He was also noted to have an abnormal prostate exam particular on the right side.  Please see previous notes for details.  Blood pressure (!) 155/80, pulse 83. NED. A&Ox3.   No respiratory distress   Abd soft, NT, ND Normal sphincter tone  Prostate Biopsy Procedure   Informed consent was obtained after discussing risks/benefits of the procedure.  A time out was performed to ensure correct patient identity.  Pre-Procedure: - Gentamicin given prophylactically - Levaquin 500 mg administered PO -Transrectal Ultrasound performed revealing a 28.64 gm prostate -Significant asymmetry noted, right side of the prostate border was ill-defined with peripheral abnormality including calcification hypoechoic lesions.  It also significantly larger than the left side.  Procedure: - Prostate block performed using 10 cc 1% lidocaine and biopsies taken from sextant areas, a total of 12 under ultrasound guidance.  Post-Procedure: - Patient tolerated the procedure well - He was counseled to seek immediate medical attention if experiences any severe pain, significant bleeding, or fevers - Return in one week to discuss biopsy results  [name], MD

## 2023-07-17 ENCOUNTER — Other Ambulatory Visit: Payer: Self-pay | Admitting: Family Medicine

## 2023-07-17 ENCOUNTER — Encounter: Payer: Self-pay | Admitting: Family Medicine

## 2023-07-17 DIAGNOSIS — E1165 Type 2 diabetes mellitus with hyperglycemia: Secondary | ICD-10-CM

## 2023-07-17 DIAGNOSIS — E876 Hypokalemia: Secondary | ICD-10-CM

## 2023-07-18 ENCOUNTER — Other Ambulatory Visit: Payer: Self-pay

## 2023-07-18 DIAGNOSIS — E875 Hyperkalemia: Secondary | ICD-10-CM

## 2023-07-19 LAB — BASIC METABOLIC PANEL
BUN/Creatinine Ratio: 6 — ABNORMAL LOW (ref 10–24)
BUN: 6 mg/dL — ABNORMAL LOW (ref 8–27)
CO2: 22 mmol/L (ref 20–29)
Calcium: 9.5 mg/dL (ref 8.6–10.2)
Chloride: 101 mmol/L (ref 96–106)
Creatinine, Ser: 0.97 mg/dL (ref 0.76–1.27)
Glucose: 74 mg/dL (ref 70–99)
Potassium: 3.2 mmol/L — ABNORMAL LOW (ref 3.5–5.2)
Sodium: 140 mmol/L (ref 134–144)
eGFR: 89 mL/min/{1.73_m2} (ref >59)

## 2023-07-21 ENCOUNTER — Telehealth: Payer: Self-pay | Admitting: Family Medicine

## 2023-07-21 ENCOUNTER — Other Ambulatory Visit: Payer: Self-pay | Admitting: Family Medicine

## 2023-07-21 DIAGNOSIS — G2581 Restless legs syndrome: Secondary | ICD-10-CM

## 2023-07-21 DIAGNOSIS — F5104 Psychophysiologic insomnia: Secondary | ICD-10-CM

## 2023-07-21 DIAGNOSIS — E876 Hypokalemia: Secondary | ICD-10-CM

## 2023-07-21 MED ORDER — GABAPENTIN 300 MG PO CAPS: 300.0000 mg | ORAL_CAPSULE | Freq: Three times a day (TID) | ORAL | 3 refills | Status: DC

## 2023-07-21 MED ORDER — POTASSIUM CHLORIDE CRYS ER 10 MEQ PO TBCR
10.0000 meq | EXTENDED_RELEASE_TABLET | Freq: Every day | ORAL | 0 refills | Status: DC
Start: 2023-07-21 — End: 2023-10-15

## 2023-07-21 MED ORDER — POTASSIUM CHLORIDE ER 20 MEQ PO TBCR
1.0000 | EXTENDED_RELEASE_TABLET | Freq: Every day | ORAL | 0 refills | Status: DC
Start: 2023-07-21 — End: 2023-08-25

## 2023-07-21 NOTE — Telephone Encounter (Signed)
[facility] faxed refill request for the following medications:  gabapentin (NEURONTIN) 300 MG capsule   Please advise.

## 2023-07-23 ENCOUNTER — Ambulatory Visit: Payer: Medicare HMO | Admitting: Podiatry

## 2023-07-23 ENCOUNTER — Ambulatory Visit: Payer: Medicare HMO | Admitting: Urology

## 2023-07-23 VITALS — BP 135/84 | HR 82

## 2023-07-23 DIAGNOSIS — R972 Elevated prostate specific antigen [PSA]: Secondary | ICD-10-CM

## 2023-07-23 DIAGNOSIS — C61 Malignant neoplasm of prostate: Secondary | ICD-10-CM | POA: Diagnosis not present

## 2023-07-23 NOTE — Progress Notes (Signed)
[name],acting as a scribe for [name], MD.,have documented all relevant documentation on the behalf of [name], MD,as directed by  [name], MD while in the presence of [name], MD.  07/23/2023 4:44 PM   Gary Benson Oct 22, 1960 [id number]  Referring provider: Sherlyn Hay, DO [address] [place],  [place] [address]  Chief Complaint  Patient presents with   Results    HPI: 62 year-old male with a personal history of elevated PSA. He presents today for a pathology discussion.   He initially presented with a PSA of 33.8. We had no other PSA's for comparison.  On rectal exam, he had enlarged firm induration on the right base but no discrete nodules. His TRUS volume was 28.6. He had significant asymmetry on the right side of the prostate with an ill-defined border and peripheral abnormalities and calcifications along with hypoechoic lesions. Also, it was significantly larger on the right side. He has 2 cores of Gleason 4+4 involving 27% of the tissue.   Today, he reports that he experienced blood in urine for three days post-biopsy.     IPSS     Row Name 07/23/23 1600         International Prostate Symptom Score   How often have you had the sensation of not emptying your bladder? Not at All     How often have you had to urinate less than every two hours? Less than 1 in 5 times     How often have you found you stopped and started again several times when you urinated? Not at All     How often have you found it difficult to postpone urination? Less than half the time     How often have you had a weak urinary stream? Not at All     How often have you had to strain to start urination? Not at All     How many times did you typically get up at night to urinate? 1 Time     Total IPSS Score 4       Quality of Life due to urinary symptoms   If you were to spend the rest of your life with your urinary condition just the way it is  now how would you feel about that? Mostly Satisfied              Score:  1-7 Mild 8-19 Moderate 20-35 Severe   SHIM     Row Name 07/23/23 1605         SHIM: Over the last 6 months:   How do you rate your confidence that you could get and keep an erection? High     When you had erections with sexual stimulation, how often were your erections hard enough for penetration (entering your partner)? Most Times (much more than half the time)     During sexual intercourse, how often were you able to maintain your erection after you had penetrated (entered) your partner? Most Times (much more than half the time)     During sexual intercourse, how difficult was it to maintain your erection to completion of intercourse? Not Difficult     When you attempted sexual intercourse, how often was it satisfactory for you? Almost Always or Always       SHIM Total Score   SHIM 22               PMH: Past Medical History:  Diagnosis Date   CHF (congestive heart failure) (HCC)    COPD (chronic obstructive pulmonary disease) (HCC)    GERD (gastroesophageal reflux disease)    Hypertension    Myocardial infarction (HCC)    Stroke [facility])     Surgical History: Past Surgical History:  Procedure Laterality Date   CAROTID STENT     CORONARY ANGIOPLASTY WITH STENT PLACEMENT     EYE SURGERY     HERNIA REPAIR Right     Home Medications:  Allergies as of 07/23/2023       Reactions   Bee Venom Anaphylaxis        Medication List        Accurate as of July 23, 2023  4:44 PM. If you have any questions, ask your nurse or doctor.          acetaminophen 500 MG tablet Commonly known as: TYLENOL Take 1,000 mg by mouth every morning.   aspirin 325 MG tablet Take 1 tablet (325 mg total) by mouth daily.   carvedilol 3.125 MG tablet Commonly known as: COREG TAKE 1 TABLET TWICE DAILY WITH MEALS   EPINEPHrine 0.3 mg/0.3 mL Soaj injection Commonly known as: EPI-PEN Inject 0.3 mg  into the muscle as needed for anaphylaxis.   famotidine 20 MG tablet Commonly known as: PEPCID Take 20 mg by mouth 2 (two) times daily.   gabapentin 300 MG capsule Commonly known as: NEURONTIN Take 1 capsule (300 mg total) by mouth 3 (three) times daily.   lisinopril 5 MG tablet Commonly known as: ZESTRIL TAKE 1 TABLET EVERY DAY   metFORMIN 500 MG 24 hr tablet Commonly known as: GLUCOPHAGE-XR TAKE 2 TABLETS TWICE A DAY WITH MEALS   potassium chloride 10 MEQ tablet Commonly known as: KLOR-CON M Take 1 tablet (10 mEq total) by mouth daily.   Potassium Chloride ER 20 MEQ Tbcr Take 1 tablet (20 mEq total) by mouth daily.   rosuvastatin 20 MG tablet Commonly known as: Crestor Take 1 tablet (20 mg total) by mouth daily.   sertraline 100 MG tablet Commonly known as: ZOLOFT TAKE 1 TABLET EVERY DAY   True Metrix Air Glucose Meter w/Device Kit USE AS DIRECTED   True Metrix Blood Glucose Test test strip Generic drug: glucose blood TEST BLOOD SUGAR IN THE MORNING, AT NOON, AND AT BEDTIME   TRUEdraw Lancing Device Misc USE AS DIRECTED   TRUEplus Lancets 33G Misc TEST BLOOD SUGAR IN THE MORNING, AT NOON, AND AT BEDTIME        Allergies:  Allergies  Allergen Reactions   Bee Venom Anaphylaxis    Family History: Family History  Problem Relation Age of Onset   Diabetes Mother    Heart disease Father    Diabetes Sister     Social History:  reports that he has been smoking cigarettes. He has never used smokeless tobacco. He reports that he does not drink alcohol and does not use drugs.   Physical Exam: BP 135/84   Pulse 82   Constitutional:  Alert and oriented, No acute distress. HEENT: [place] AT, moist mucus membranes.  Trachea midline, no masses. Neurologic: Grossly intact, no focal deficits, moving all 4 extremities. Psychiatric: Normal mood and affect.   Assessment & Plan:    1. Prostate cancer - High risk, newly dx.    - The patient was counseled about  the natural history of prostate cancer and the standard treatment options that are available for prostate cancer. It was explained to him how  his age and life expectancy, clinical stage, Gleason score, and PSA affect his prognosis, the decision to proceed with additional staging studies, as well as how that information influences recommended treatment strategies. We discussed the roles for active surveillance, radiation therapy, surgical therapy, androgen deprivation, as well as ablative therapy options for the treatment of prostate cancer as appropriate to his individual cancer situation. We discussed the risks and benefits of these options with regard to their impact on cancer control and also in terms of potential adverse events, complications, and impact on quality of life particularly related to urinary, bowel, and sexual function. The patient was encouraged to ask questions throughout the discussion today and all questions were answered to his stated satisfaction. In addition, the patient was provided with and/or directed to appropriate resources and literature for further education about prostate cancer treatment options.  - Plan to obtain a PSMA PET scan to assess for metastasis.  - Consider systemic treatment if metastasis is detected.  2. Elevated PSA - Monitor PSA levels as part of ongoing assessment.   Return in about 2 weeks (around 08/06/2023) for dicussion of PSMA PET scan results.  I have reviewed the above documentation for accuracy and completeness, and I agree with the above.   [name], MD  I spent 31 total minutes on the day of the encounter including pre-visit review of the medical record, face-to-face time with the patient, and post visit ordering of labs/imaging/tests.   [facility] [address] [place], [place] [address] [phone]

## 2023-07-24 ENCOUNTER — Encounter: Payer: Medicare HMO | Admitting: Family Medicine

## 2023-07-28 ENCOUNTER — Ambulatory Visit: Payer: Medicare HMO | Admitting: Family Medicine

## 2023-07-28 ENCOUNTER — Encounter: Payer: Self-pay | Admitting: Family Medicine

## 2023-07-28 VITALS — BP 125/79 | HR 74 | Temp 97.9°F | Ht 69.0 in | Wt 154.0 lb

## 2023-07-28 DIAGNOSIS — Z0001 Encounter for general adult medical examination with abnormal findings: Secondary | ICD-10-CM | POA: Diagnosis not present

## 2023-07-28 DIAGNOSIS — R9431 Abnormal electrocardiogram [ECG] [EKG]: Secondary | ICD-10-CM

## 2023-07-28 DIAGNOSIS — E1165 Type 2 diabetes mellitus with hyperglycemia: Secondary | ICD-10-CM | POA: Diagnosis not present

## 2023-07-28 DIAGNOSIS — Z0000 Encounter for general adult medical examination without abnormal findings: Secondary | ICD-10-CM | POA: Insufficient documentation

## 2023-07-28 DIAGNOSIS — Z23 Encounter for immunization: Secondary | ICD-10-CM

## 2023-07-28 NOTE — Progress Notes (Signed)
Annual Wellness Visit     Patient: Gary Benson, Male    DOB: 05-11-1961, 62 y.o.   MRN: [id number] Visit Date: 07/28/2023  Today's Provider: Sherlyn Hay, DO   Chief Complaint  Patient presents with   Annual Exam    Welcome to Medicare PE   Subjective    Gary Benson is a 62 y.o. male who presents today for his Annual Wellness Visit. He reports consuming a general diet.  He reports he is very active in doing chorse around the house and taking care of his wife.  He generally feels fairly well. He reports sleeping fairly well, though he wakes up to urinate 2-3x per night. He does not have additional problems to discuss today.   HPI Family in area?  Wife and three step-daughters and one son. - Velna Hatchet lives up to road  - Ronaldo Miyamoto and Collins live in [place]  - Tammy lives in [place] Life planning discussed  Gets meals from [employer]  Last eye exam? overdue Lung and colon cancer screenings  Recent prostate cancer diagnosis  Metformin - takes one in the morning and two in the evening  - checks BG a couple hours after breakfast  BG during breakfast: 100-120s   - before supper: 70s-80s  - switched to 1 tab BID once and his morning BG was in the 120s.  Smoked 1 ppd for past 30 years  Medications: Outpatient Medications Prior to Visit  Medication Sig   acetaminophen (TYLENOL) 500 MG tablet Take 1,000 mg by mouth every morning.   aspirin 325 MG tablet Take 1 tablet (325 mg total) by mouth daily.   Blood Glucose Monitoring Suppl (TRUE METRIX AIR GLUCOSE METER) w/Device KIT USE AS DIRECTED   carvedilol (COREG) 3.125 MG tablet TAKE 1 TABLET TWICE DAILY WITH MEALS   EPINEPHrine 0.3 mg/0.3 mL IJ SOAJ injection Inject 0.3 mg into the muscle as needed for anaphylaxis.   famotidine (PEPCID) 20 MG tablet Take 20 mg by mouth 2 (two) times daily.   gabapentin (NEURONTIN) 300 MG capsule Take 1 capsule (300 mg total) by mouth 3 (three) times daily.   Lancet Devices (TRUEDRAW  LANCING DEVICE) MISC USE AS DIRECTED   lisinopril (ZESTRIL) 5 MG tablet TAKE 1 TABLET EVERY DAY   metFORMIN (GLUCOPHAGE-XR) 500 MG 24 hr tablet TAKE 2 TABLETS TWICE A DAY WITH MEALS   potassium chloride (KLOR-CON M) 10 MEQ tablet Take 1 tablet (10 mEq total) by mouth daily.   Potassium Chloride ER 20 MEQ TBCR Take 1 tablet (20 mEq total) by mouth daily.   rosuvastatin (CRESTOR) 20 MG tablet Take 1 tablet (20 mg total) by mouth daily.   sertraline (ZOLOFT) 100 MG tablet TAKE 1 TABLET EVERY DAY   TRUE METRIX BLOOD GLUCOSE TEST test strip TEST BLOOD SUGAR IN THE MORNING, AT NOON, AND AT BEDTIME   TRUEplus Lancets 33G MISC TEST BLOOD SUGAR IN THE MORNING, AT NOON, AND AT BEDTIME   No facility-administered medications prior to visit.    Allergies  Allergen Reactions   Bee Venom Anaphylaxis    Patient Care Team: Sherlyn Hay, DO as PCP - General (Family Medicine)  Review of Systems  Constitutional:  Negative for chills and fever.  Respiratory: Negative.  Negative for cough, shortness of breath and wheezing.   Cardiovascular:  Negative for chest pain, palpitations and leg swelling.  Neurological:  Positive for weakness (intermitten, when standing for too long; uses cane for this). Negative  for headaches.         Objective    Vitals: BP 125/79 (BP Location: Left Arm, Patient Position: Sitting, Cuff Size: Normal)   Pulse 74   Temp 97.9 F (36.6 C) (Oral)   Ht 5\' 9"  (1.753 m)   Wt 154 lb (69.9 kg)   SpO2 99%   BMI 22.74 kg/m     Physical Exam Vitals and nursing note reviewed.  Constitutional:      General: He is not in acute distress.    Appearance: Normal appearance.  HENT:     Head: Normocephalic and atraumatic.  Eyes:     General: No scleral icterus.    Conjunctiva/sclera: Conjunctivae normal.  Cardiovascular:     Rate and Rhythm: Normal rate.  Pulmonary:     Effort: Pulmonary effort is normal.  Neurological:     Mental Status: He is alert and oriented to  person, place, and time. Mental status is at baseline.  Psychiatric:        Mood and Affect: Mood normal.        Behavior: Behavior normal.     Most recent functional status assessment:    07/28/2023   11:39 AM  In your present state of health, do you have any difficulty performing the following activities:  Hearing? 0  Vision? 0  Difficulty concentrating or making decisions? 0  Walking or climbing stairs? 1  Comment takes time climbing stairs; uses cane at store  Dressing or bathing? 0  Doing errands, shopping? 0   Most recent fall risk assessment:    07/28/2023   11:39 AM  Fall Risk   Falls in the past year? 0  Number falls in past yr: 0  Injury with Fall? 0    Most recent depression screenings:    07/28/2023   11:18 AM 04/21/2023   10:19 AM  PHQ 2/9 Scores  PHQ - 2 Score 0 0  PHQ- 9 Score 0 1   Most recent cognitive screening:    07/28/2023   11:35 AM  6CIT Screen  What Year? 0 points  What month? 0 points  What time? 0 points  Count back from 20 0 points  Months in reverse 0 points  Repeat phrase 0 points  Total Score 0 points   Most recent Audit-C alcohol use screening    07/28/2023   11:39 AM  Alcohol Use Disorder Test (AUDIT)  1. How often do you have a drink containing alcohol? 0  2. How many drinks containing alcohol do you have on a typical day when you are drinking? 0  3. How often do you have six or more drinks on one occasion? 0  AUDIT-C Score 0   A score of 3 or more in women, and 4 or more in men indicates increased risk for alcohol abuse, EXCEPT if all of the points are from question 1   No results found for any visits on 07/28/23.  Assessment & Plan     Annual wellness visit done today including the all of the following: Reviewed patient's Family Medical History Reviewed and updated list of patient's medical providers Assessment of cognitive impairment was done Assessed patient's functional ability Established a written schedule  for health screening services Health Risk Assessent Completed and Reviewed Patient is not on any opioid pain medications   Exercise Activities and Dietary recommendations  Goals      Medication Adherence Maintained     Evidence-based guidance:  Develop a complete and accurate medication  list including those prescribed and over-the-counter, those taken only occasionally and those not taken by mouth such as injections, inhalers, ointments or creams and drops.  Review all medications to determine if patient or caregiver knows why the medications are given and if taken as prescribed.  Complete or review a medication adherence assessment including barriers to medication adherence.  Arrange and encourage counseling and medication review by pharmacist.  Assess barriers to medication adherence.  Manage poor understanding or health literacy by using easy to understand language, teach-back, visual aids and teaching only 2 or 3 points at a time.  Assess presence of side effects; provide suggestions to manage or reduce side effects.  Consult with provider and/or pharmacist regarding substitute medication, changing dose, simplification of regimen or safe discontinuation of some medications.  Encourage the use of medication reminders such as clock or cell phone alarm, color coding, pillboxes for am/pm and days of the week, pharmacy refill reminder, auto-refill system or mail-order option.  Assist with resources when cost is a barrier; refer to prescription assistance programs; confirm that generics are prescribed whenever possible; consider 90-day prescriptions to reduce copay cost; synchronize refills.  Provide help to complete medication assistance applications or health insurance forms as needed.  Complete a follow-up call 2 to 3 weeks after medication self-management plan developed; assess adherence and understanding, as well as listen to patient or caregiver concerns; amend plan as needed.  Provide  frequent follow-up providing motivation, encouragement and support when medication nonadherence is identified.   Notes:         Immunization History  Administered Date(s) Administered   Influenza, Seasonal, Injecte, Preservative Fre 07/28/2023   Influenza,inj,Quad PF,6+ Mos 09/22/2016   Pneumococcal Polysaccharide-23 09/22/2016   Tdap 07/07/2015    Health Maintenance  Topic Date Due   Zoster Vaccines- Shingrix (1 of 2) 08/08/2023 (Originally 04/03/1981)   Colonoscopy  08/28/2023 (Originally 04/04/2007)   OPHTHALMOLOGY EXAM  11/17/2023 (Originally 04/03/1972)   COVID-19 Vaccine (1) 01/05/2024 (Originally 04/04/1967)   Lung Cancer Screening  07/27/2024 (Originally 12/22/2013)   HEMOGLOBIN A1C  11/28/2023   FOOT EXAM  05/21/2024   Diabetic kidney evaluation - Urine ACR  05/26/2024   Diabetic kidney evaluation - eGFR measurement  07/17/2024   Medicare Annual Wellness (AWV)  07/27/2024   DTaP/Tdap/Td (2 - Td or Tdap) 07/06/2025   INFLUENZA VACCINE  Completed   HPV VACCINES  Aged Out   Hepatitis C Screening  Discontinued   HIV Screening  Discontinued     Discussed health benefits of physical activity, and encouraged him to engage in regular exercise appropriate for his age and condition.    Encounter for initial annual wellness visit (AWV) in Medicare patient Assessment & Plan: No acute concerns during today's annual wellness visit.   Patient declined additional cancer screenings (lung cancer screening and colonoscopy) for the time being due to his recent diagnosis of prostate cancer.  He may consider rescheduling his colonoscopy soon.   Annual physical exam Assessment & Plan: EKG shows sinus rhythm with rate of 78, PR interval 162, QRS 118, QT/QTc 414/471, with deepened, widened QRS waves in leads V1 through V3 which is suggestive of possible prior septal infarct. Patient is asymptomatic at this time.  However, we will go ahead and refer to cardiology for further  evaluation.  Orders: -     EKG 12-Lead  Type 2 diabetes mellitus with hyperglycemia, without long-term current use of insulin (HCC) Assessment & Plan: Based on recent low blood sugars and patient preference,  will go ahead and reduce his metformin to 1 tablet twice daily.   Encounter for immunization -     Flu vaccine trivalent PF, 6mos and older(Flulaval,Afluria,Fluarix,Fluzone)  Abnormal ECG -     Ambulatory referral to Cardiology    Return in about 4 weeks (around 08/25/2023) for HTN, DM.     I discussed the assessment and treatment plan with the patient  The patient was provided an opportunity to ask questions and all were answered. The patient agreed with the plan and demonstrated an understanding of the instructions.   The patient was advised to call back or seek an in-person evaluation if the symptoms worsen or if the condition fails to improve as anticipated.    Sherlyn Hay, DO  [facility] [phone] (phone) [phone] (fax)  [facility]

## 2023-07-28 NOTE — Assessment & Plan Note (Signed)
Based on recent low blood sugars and patient preference, will go ahead and reduce his metformin to 1 tablet twice daily.

## 2023-07-28 NOTE — Assessment & Plan Note (Signed)
EKG shows sinus rhythm with rate of 78, PR interval 162, QRS 118, QT/QTc 414/471, with deepened, widened QRS waves in leads V1 through V3 which is suggestive of possible prior septal infarct. Patient is asymptomatic at this time.  However, we will go ahead and refer to cardiology for further evaluation.

## 2023-07-28 NOTE — Assessment & Plan Note (Signed)
No acute concerns during today's annual wellness visit.   Patient declined additional cancer screenings (lung cancer screening and colonoscopy) for the time being due to his recent diagnosis of prostate cancer.  He may consider rescheduling his colonoscopy soon.

## 2023-07-28 NOTE — Patient Instructions (Addendum)
Check with urologist to ask if there's an option for medication for urinary frequency.  Get Eye exam - check for diabetic retinopathy

## 2023-08-01 ENCOUNTER — Encounter: Payer: Self-pay | Admitting: Urology

## 2023-08-11 ENCOUNTER — Ambulatory Visit
Admission: RE | Admit: 2023-08-11 | Discharge: 2023-08-11 | Disposition: A | Discharge status: Home / Self Care | Payer: Medicare HMO | Source: Ambulatory Visit | Attending: Urology | Admitting: Urology

## 2023-08-11 DIAGNOSIS — C61 Malignant neoplasm of prostate: Secondary | ICD-10-CM | POA: Insufficient documentation

## 2023-08-11 DIAGNOSIS — E119 Type 2 diabetes mellitus without complications: Secondary | ICD-10-CM | POA: Insufficient documentation

## 2023-08-11 DIAGNOSIS — R972 Elevated prostate specific antigen [PSA]: Secondary | ICD-10-CM

## 2023-08-11 MED ORDER — FLOTUFOLASTAT F 18 GALLIUM 296-5846 MBQ/ML IV SOLN
8.0000 | Freq: Once | INTRAVENOUS | Status: AC
  Administered 2023-08-11: 8.38 via INTRAVENOUS
  Filled 2023-08-11: qty 8

## 2023-08-13 ENCOUNTER — Other Ambulatory Visit: Payer: Self-pay | Admitting: Family Medicine

## 2023-08-13 ENCOUNTER — Ambulatory Visit: Payer: Medicare HMO | Admitting: Urology

## 2023-08-13 VITALS — BP 148/83 | HR 92 | Ht 69.0 in | Wt 154.1 lb

## 2023-08-13 DIAGNOSIS — C61 Malignant neoplasm of prostate: Secondary | ICD-10-CM

## 2023-08-13 NOTE — Progress Notes (Signed)
Gary Benson,acting as a Neurosurgeon for Gary Scotland, MD.,have documented all relevant documentation on the behalf of Gary Scotland, MD,as directed by  Gary Scotland, MD while in the presence of Gary Scotland, MD.  08/13/2023 4:09 PM   Gary Benson Neighbors [birth year] [id number]  Referring provider: Sherlyn Hay, DO [address] [place],  [place] [address]  Chief Complaint  Patient presents with   Results    HPI: 62 year-old male who presents today for follow up of prostate cancer.   Please see previous note for details. He initially presented with a PSA of 33.8. He was diagnosed with high-risk prostate cancer, Gleason 4+3 on the right side. TRUS volume 28.6.  In the interim, he has undergone PET scan that shows intense radioactivity of the right prostate gland consistent with his primary adenocarcinoma, but no evidence of metastatic disease or lymphadenopathy.   He expresses concerns about the side effects of hormone therapy but is leaning towards radiation due to personal history of cardiac stent and stroke.  He is accompanied today by his stepdaughter.   PMH: Past Medical History:  Diagnosis Date   CHF (congestive heart failure) (HCC)    COPD (chronic obstructive pulmonary disease) (HCC)    GERD (gastroesophageal reflux disease)    Hypertension    Myocardial infarction (HCC)    Stroke [facility])     Surgical History: Past Surgical History:  Procedure Laterality Date   CAROTID STENT     CORONARY ANGIOPLASTY WITH STENT PLACEMENT     EYE SURGERY     HERNIA REPAIR Right     Home Medications:  Allergies as of 08/13/2023       Reactions   Bee Venom Anaphylaxis        Medication List        Accurate as of August 13, 2023  4:09 PM. If you have any questions, ask your nurse or doctor.          acetaminophen 500 MG tablet Commonly known as: TYLENOL Take 1,000 mg by mouth every morning.   aspirin 325 MG tablet Take 1 tablet (325 mg total) by  mouth daily.   carvedilol 3.125 MG tablet Commonly known as: COREG TAKE 1 TABLET TWICE DAILY WITH MEALS   EPINEPHrine 0.3 mg/0.3 mL Soaj injection Commonly known as: EPI-PEN Inject 0.3 mg into the muscle as needed for anaphylaxis.   famotidine 20 MG tablet Commonly known as: PEPCID Take 20 mg by mouth 2 (two) times daily.   gabapentin 300 MG capsule Commonly known as: NEURONTIN Take 1 capsule (300 mg total) by mouth 3 (three) times daily.   lisinopril 5 MG tablet Commonly known as: ZESTRIL TAKE 1 TABLET EVERY DAY   metFORMIN 500 MG 24 hr tablet Commonly known as: GLUCOPHAGE-XR TAKE 2 TABLETS TWICE A DAY WITH MEALS   potassium chloride 10 MEQ tablet Commonly known as: KLOR-CON M Take 1 tablet (10 mEq total) by mouth daily.   Potassium Chloride ER 20 MEQ Tbcr Take 1 tablet (20 mEq total) by mouth daily. What changed: Another medication with the same name was added. Make sure you understand how and when to take each. Changed by: Maralyn Sago N PARDUE   potassium chloride 10 MEQ tablet Commonly known as: KLOR-CON TAKE 1 TABLET EVERY DAY What changed: You were already taking a medication with the same name, and this prescription was added. Make sure you understand how and when to take each. Changed by: Monico Blitz PARDUE   rosuvastatin 20  MG tablet Commonly known as: Crestor Take 1 tablet (20 mg total) by mouth daily.   sertraline 100 MG tablet Commonly known as: ZOLOFT TAKE 1 TABLET EVERY DAY   True Metrix Air Glucose Meter w/Device Kit USE AS DIRECTED   True Metrix Blood Glucose Test test strip Generic drug: glucose blood TEST BLOOD SUGAR IN THE MORNING, AT NOON, AND AT BEDTIME   TRUEdraw Lancing Device Misc USE AS DIRECTED   TRUEplus Lancets 33G Misc TEST BLOOD SUGAR IN THE MORNING, AT NOON, AND AT BEDTIME        Allergies:  Allergies  Allergen Reactions   Bee Venom Anaphylaxis    Family History: Family History  Problem Relation Age of Onset   Diabetes  Mother    Heart disease Father    Diabetes Sister     Social History:  reports that he has been smoking cigarettes. He has never used smokeless tobacco. He reports that he does not drink alcohol and does not use drugs.   Physical Exam: BP (!) 148/83   Pulse 92   Ht 5\' 9"  (1.753 m)   Wt 154 lb 2 oz (69.9 kg)   BMI 22.76 kg/m   Constitutional:  Alert and oriented, No acute distress. HEENT: [place] AT, moist mucus membranes.  Trachea midline, no masses. Neurologic: Grossly intact, no focal deficits, moving all 4 extremities. Psychiatric: Normal mood and affect.  Assessment & Plan:    1. Prostate cancer - High-risk, localized to the prostate - Discussed options, including prostatectomy versus radiation - We discussed surgical therapy for prostate cancer including the different available surgical approaches.  Specifically, we discussed robotic prostatectomy with pelvic lymph node dissection based on his restratification.  We discussed, in detail, the risks and expectations of surgery with regard to cancer control, urinary control, and erectile dysfunction as well as expected post operative recovery processed. Additional risks of surgery including but not limited to bleeding, infection, hernia formation, nerve damage, fistula formation, bowel/rectal injury, potentially necessitating colostomy, damage to the urinary tract resulting in urinary leakage, urethral stricture, and cardiopulmonary risk such as myocardial infarction, stroke, death, thromboembolism etc. were explained.  - Referral to radiation oncology consult, given his multiple medical comorbidities  - As an option, could also consider a multi-modal approach - High risk for need for adjuvant treatment if he has a recurrence or PSA persistence - Provided handouts on radical prostatectomy, ADT,  and radiation therapy.  - We discussed the side effects of ADT, would recommend two to three years if he decides for radiation, he's leaning  towards this due to his personal history of cardiac stent, stroke, etc. - Discussed the importance of making an informed decision and encouraged further discussion with family.  At this point, he is leaning towards radiation with ADT.  Return in about 2 weeks (around 08/27/2023) for finalization of treatment plan and ensure coordination with radiation oncology.  I have reviewed the above documentation for accuracy and completeness, and I agree with the above.   Gary Scotland, MD   [facility] [address] [place], [place] [address] [phone]  I spent 31 total minutes on the day of the encounter including pre-visit review of the medical record, face-to-face time with the patient, and post visit ordering of labs/imaging/tests.

## 2023-08-13 NOTE — Patient Instructions (Signed)
Hormone Suppression Therapy for Prostate Cancer  Hormone suppression therapy is a treatment for prostate cancer that can help slow the growth of cancer cells in the prostate gland. It is also called androgen deprivation therapy (ADT) or androgen suppression therapy. Hormone suppression therapy targets male sex hormones (androgens) in the body that help cancer cells grow. Hormone suppression therapy alone will not cure prostate cancer, but it can slow the growth of cancer cells and may shrink tumors over time. Your health care provider can help you find the best treatment that fits your lifestyle. Hormone suppression therapy may be used in the following cases: When prostate cancer has spread too far to other places in the body and cannot be cured by surgery or radiation. When a person has health problems that prevent the use of surgery or radiation. Before radiation to help shrink the size of the cancer and make the radiation treatment more effective. If the prostate cancer remains or comes back following treatment with surgery or radiation. What are the types of hormone suppression therapy? Orchiectomy Orchiectomy, also called surgical castration, is a surgery to remove one or both testicles. The testicles make the two main androgens--testosterone and dihydrotestosterone (DHT). This surgery reduces the levels of testosterone in the blood, leading to decreased androgen production. Medicine therapy Medicine therapy, also called medical or chemical castration, involves taking medicines to keep your body from making or using androgens. Medicines can do this in one of three ways: Reducing androgen production by the testicles. Luteinizing hormone-releasing hormone [facility]) agonists. These medicines are injected or implanted under your skin to lower the amount of androgens that your testicles make. Depending on the medicine, they can be given monthly or up to every 3 to 6 months. If you take these medicines,  you may also be prescribed other medicines to help with side effects. LHRH antagonists. These medicines also work to lower the amount of androgens made in the testicles, but they work faster than [facility] agonist medicines and have less severe side effects. They are given as a monthly injection under the skin, and they are used when prostate cancer is in an advanced stage. Estrogens. These medicines are male hormones that help to reduce androgen production by the testicles. Estrogens are not used as commonly as other types of hormone suppression therapy due to their side effects. However, they may be used if other treatments do not work. Blocking androgen attachment throughout the body. Anti-androgen medicines, also called androgen receptor antagonists, block areas on the body where androgens attach. These are pills that are usually used in combination with other types of hormone suppression therapy, like orchiectomy and other medicines. Blocking androgen production throughout the body. Androgen synthesis inhibitor medicines. These medicines help to stop other areas of the body from making androgens. They are taken as pills. They may be used if the prostate cancer is advanced and has not gotten better with surgery or other medicines. A steroid medicine may be given with this type of medicine to help with side effects. What are the risks? Hormone suppression therapy may cause side effects, including: Hot flashes. Diarrhea and nausea. Itching. Sexual side effects, such as: Decrease or lack of sexual desire. Decrease in size of the penis or testicles. Inability to get an erection (erectile dysfunction, or impotence). Breast tenderness or increase in breast size. Fatigue. Weight gain. Anemia. Thinning of the bones (osteoporosis) and loss of muscle mass. Depression, mood swings, and trouble with thinking or focusing. Hormone suppression therapy may also increase  your risk of high blood pressure,  increased cholesterol levels, stroke, heart attack, or diabetes. What are the benefits? One of the main benefits of hormone suppression therapy is having additional treatment options. You may have only one type of treatment, or two or more types at the same time. Treatments may be combined to: Help with side effects. Treat advanced cancer. Where to find more information American Cancer Society: www.cancer.org National Cancer Institute: www.cancer.gov Contact a health care provider if: You have pain or side effects that do not get better with treatment. You have trouble urinating. You have new side effects that do not go away. Get help right away if: You have severe chest pain. You have trouble breathing. You have an irregular heartbeat. You have numbness or paralysis in the lower half of your body. You are confused. You have trouble talking or understanding speech. These symptoms may be an emergency. Do not wait to see if the symptoms will go away. Get medical help right away. Call your local emergency services (911 in the [place] Do not drive yourself to the hospital. Summary Hormone suppression therapy is a treatment for prostate cancer that can help to slow the growth of cancer cells in the prostate gland. Hormone suppression therapy alone will not cure prostate cancer, but it can slow the growth of prostate cancer and may shrink tumors over time. Treatment to suppress hormones may include surgery or medicines. Side effects such as hot flashes, changes in sexual function or desire, and weakened bones can result from hormone suppression therapy. This information is not intended to replace advice given to you by your health care provider. Make sure you discuss any questions you have with your health care provider. Document Revised: 01/04/2021 Document Reviewed: 01/04/2021 Elsevier Patient Education  2024 [employer] Robot-Assisted Laparoscopic Radical Prostatectomy  Robot-assisted  laparoscopic radical prostatectomy is surgery done to remove the entire prostate and nearby tissue. This includes the seminal vesicles, which are near the bladder and the prostate. This procedure is done to treat prostate cancer that has not spread (metastasized) to other parts of the body. The goal of the surgery is to remove all cancer cells to help keep the cancer from metastasizing. During this procedure, the surgeon makes several incisions in the abdomen instead of one large incision. A long, thin, lighted tube with a tiny camera on the end (laparoscope) is put into one of the incisions. This allows the surgeon to see inside the abdomen. Other surgical tools are put in through the other incisions and used to take out the prostate and nearby tissues. The surgeon uses robotic arms to control these tools while sitting at a computer near the operating table. Lymph nodes in the pelvis may also be removed. Lymph nodes are part of the body's disease-fighting system (immune system). When prostate cancer spreads, it tends to go to the lymph nodes in the pelvis first. If the pelvic lymph nodes are removed, they will be checked for cancer cells. Tell a health care provider about: Any allergies you have. All medicines you are taking, including vitamins, herbs, eye drops, creams, and over-the-counter medicines. Any problems you or family members have had with anesthetic medicines. Any bleeding problems you have. Any surgeries you have had. Any medical conditions you have. Any prostate infections you have had. What are the risks? Generally, this is a safe procedure. Still, problems may occur, including: Infection. Bleeding. Allergic reactions to medicines. Damage to nearby structures or organs, such as the rectum, ureters, urethra,  bladder, or small intestine. Blockage (obstruction) of the large or small intestines. Problems that affect urination or sexual function. These may include: Narrowing or scarring  of the urethra (stricture), which may block the flow of urine. Inability to control when you urinate (incontinence). Inability to get or keep an erection (erectile dysfunction). Dry ejaculation. This is when no semen comes out during orgasm. The formation of a sac (cyst) in the pelvis that is filled with fluid from the lymph glands (lymphocele). Blood clots in the legs. What happens before the procedure? Staying hydrated Follow instructions from your health care provider about hydration, which may include: Up to 2 hours before the procedure - you may continue to drink clear liquids, such as water, clear fruit juice, black coffee, and plain tea.  Eating and drinking restrictions Follow instructions from your health care provider about eating and drinking, which may include: 8 hours before the procedure - stop eating heavy meals or foods, such as meat, fried foods, or fatty foods. 6 hours before the procedure - stop eating light meals or foods, such as toast or cereal. 6 hours before the procedure - stop drinking milk or drinks that contain milk. 2 hours before the procedure - stop drinking clear liquids. Medicines Ask your health care provider about: Changing or stopping your regular medicines. This is especially important if you are taking diabetes medicines or blood thinners. Taking medicines such as aspirin and ibuprofen. These medicines can thin your blood. Do not take these medicines unless your health care provider tells you to take them. Taking over-the-counter medicines, vitamins, herbs, and supplements. Follow your health care provider's instructions about cleaning out your bowels. Surgery safety Ask your health care provider: How your surgery site will be marked. What steps will be taken to help prevent infection. These steps may include: Removing hair at the surgery site. Washing skin with a germ-killing soap. Taking antibiotic medicine. General instructions Do not use any  products that contain nicotine or tobacco for at least 4 weeks before the procedure. These products include cigarettes, chewing tobacco, and vaping devices, such as e-cigarettes. If you need help quitting, ask your health care provider. Plan to have a responsible adult take you home from the hospital or clinic. Plan to have a responsible adult care for you for the time you are told after you leave the hospital or clinic. You may have an exam or testing. This may include blood or urine samples, or imaging tests such as a CT scan or an MRI. What happens during the procedure? An IV will be put into a vein in your hand or arm. You may be given: A medicine to help you relax (sedative). A medicine to make you fall asleep (general anesthetic). A thin, flexible tube (Foley catheter) will be put into your penis through your urethra and into your bladder to drain your urine. Small incisions will be made in your abdomen and near your belly button. The laparoscope and other surgical instruments will be put through the incisions. The surgical tools will be used to cut and remove your prostate, seminal vesicles, and maybe your pelvic lymph nodes. Your surgeon will use a computer and robotic arms to control the surgical instruments. Your urethra will be cut and separated from your bladder to take out the prostate. Your urethra will then be reconnected to your bladder neck. This is the group of muscles that help push urine through your urethra. A small tube (drain) may be put in one or more  of your incisions to help drain extra fluid from your surgical site after surgery. The laparoscope and other surgical instruments will be removed. Your incisions will be closed with stitches (sutures), skin glue, or adhesive strips. Medicine may be applied and bandages (dressings) will be placed over your incisions. The procedure may vary among health care providers and hospitals. What happens after the procedure? Your blood  pressure, heart rate, breathing rate, and blood oxygen level will be monitored until you leave the hospital or clinic. You may get fluids and medicines through your IV. You may be given antibiotics and medicines to help relieve pain or nausea. You will be encouraged to walk as soon as possible. You will also use a device or do breathing exercises to keep your lungs clear. The catheter will stay in to drain urine from your bladder. You will be taught how to care for it at home. The drain may stay in to drain fluid from the surgical site. If so, you will be taught how to care for it at home. You may need to wear compression stockings until you are able to get up and walk around. These stockings help prevent blood clots and reduce swelling in your legs. If you were given a sedative during the procedure, it can affect you for several hours. Do not drive or operate machinery until your health care provider says that it is safe. Summary Robot-assisted laparoscopic radical prostatectomy is a surgical procedure to remove the entire prostate and the seminal vesicles. Follow instructions from your health care provider about eating and drinking before your surgery. After your procedure, you may be given fluids and medicines through an IV. You may get antibiotics and medicines to help relieve pain or nausea. After your surgery, you will continue to have a small, thin tube (Foley catheter) draining your urine. You will be taught how to care for it at home. This information is not intended to replace advice given to you by your health care provider. Make sure you discuss any questions you have with your health care provider. Document Revised: 12/20/2020 Document Reviewed: 12/20/2020 Elsevier Patient Education  2024 [employer].

## 2023-08-22 ENCOUNTER — Ambulatory Visit: Payer: Medicare HMO | Admitting: Family Medicine

## 2023-08-25 ENCOUNTER — Encounter: Payer: Self-pay | Admitting: Family Medicine

## 2023-08-25 ENCOUNTER — Ambulatory Visit: Payer: Medicare HMO | Admitting: Family Medicine

## 2023-08-25 VITALS — BP 117/78 | HR 82 | Resp 16 | Ht 69.0 in | Wt 154.3 lb

## 2023-08-25 DIAGNOSIS — Z23 Encounter for immunization: Secondary | ICD-10-CM

## 2023-08-25 DIAGNOSIS — Z7984 Long term (current) use of oral hypoglycemic drugs: Secondary | ICD-10-CM | POA: Diagnosis not present

## 2023-08-25 DIAGNOSIS — I10 Essential (primary) hypertension: Secondary | ICD-10-CM

## 2023-08-25 DIAGNOSIS — Z79899 Other long term (current) drug therapy: Secondary | ICD-10-CM

## 2023-08-25 DIAGNOSIS — Z5320 Procedure and treatment not carried out because of patient's decision for unspecified reasons: Secondary | ICD-10-CM | POA: Insufficient documentation

## 2023-08-25 DIAGNOSIS — F17200 Nicotine dependence, unspecified, uncomplicated: Secondary | ICD-10-CM | POA: Diagnosis not present

## 2023-08-25 DIAGNOSIS — E876 Hypokalemia: Secondary | ICD-10-CM | POA: Diagnosis not present

## 2023-08-25 DIAGNOSIS — C61 Malignant neoplasm of prostate: Secondary | ICD-10-CM | POA: Diagnosis not present

## 2023-08-25 DIAGNOSIS — E1165 Type 2 diabetes mellitus with hyperglycemia: Secondary | ICD-10-CM | POA: Diagnosis not present

## 2023-08-25 NOTE — Progress Notes (Signed)
Established patient visit   Patient: Gary Benson   DOB: 02/27/1961   62 y.o. Male  MRN: [id number] Visit Date: 08/25/2023  Today's healthcare provider: Sherlyn Hay, DO   Chief Complaint  Patient presents with   Medical Management of Chronic Issues    Has been dx with prostate cancer and is set to start treatment soon.    Subjective    HPI The patient, with a history of hypertension, diabetes, and recently diagnosed prostate cancer, presents for a follow-up visit. He reports no new health issues since the last visit. He has already received a flu shot during a previous visit.  The patient reports that his urologist was satisfied with his recent PET scan results, which showed no metastasis of the cancer. The cancer is localized to one spot, and the urologist believes radiation should effectively treat it. The patient prefers radiation treatment over surgery due to concerns about potential complications, such as the need for a catheter, as well as in relation to his history of stroke and heart attack.  The patient's blood sugars have been well-controlled, with readings ranging from the 70s to 129. He has been taking one tablet of Metformin twice daily, a reduction from his previous dosage. The patient reports no hypoglycemic symptoms with this regimen.  The patient continues to smoke, albeit less than before, and consumes about half a pack of cigarettes daily. He has been smoking for over 40 years. The patient has declined lung cancer screening, preferring to focus on his current cancer treatment.  The patient has not yet had his annual eye exam due to insurance coverage limitations. He is also taking daily potassium supplements and Famotidine for acid reflux.     Medications: Outpatient Medications Prior to Visit  Medication Sig   acetaminophen (TYLENOL) 500 MG tablet Take 1,000 mg by mouth every morning.   aspirin 325 MG tablet Take 1 tablet (325 mg total) by mouth  daily.   Blood Glucose Monitoring Suppl (TRUE METRIX AIR GLUCOSE METER) w/Device KIT USE AS DIRECTED   carvedilol (COREG) 3.125 MG tablet TAKE 1 TABLET TWICE DAILY WITH MEALS   EPINEPHrine 0.3 mg/0.3 mL IJ SOAJ injection Inject 0.3 mg into the muscle as needed for anaphylaxis.   famotidine (PEPCID) 20 MG tablet Take 20 mg by mouth 2 (two) times daily.   gabapentin (NEURONTIN) 300 MG capsule Take 1 capsule (300 mg total) by mouth 3 (three) times daily.   Lancet Devices (TRUEDRAW LANCING DEVICE) MISC USE AS DIRECTED   lisinopril (ZESTRIL) 5 MG tablet TAKE 1 TABLET EVERY DAY   metFORMIN (GLUCOPHAGE-XR) 500 MG 24 hr tablet TAKE 2 TABLETS TWICE A DAY WITH MEALS   potassium chloride (KLOR-CON M) 10 MEQ tablet Take 1 tablet (10 mEq total) by mouth daily.   potassium chloride (KLOR-CON) 10 MEQ tablet TAKE 1 TABLET EVERY DAY   rosuvastatin (CRESTOR) 20 MG tablet Take 1 tablet (20 mg total) by mouth daily.   sertraline (ZOLOFT) 100 MG tablet TAKE 1 TABLET EVERY DAY   TRUE METRIX BLOOD GLUCOSE TEST test strip TEST BLOOD SUGAR IN THE MORNING, AT NOON, AND AT BEDTIME   TRUEplus Lancets 33G MISC TEST BLOOD SUGAR IN THE MORNING, AT NOON, AND AT BEDTIME   [DISCONTINUED] Potassium Chloride ER 20 MEQ TBCR Take 1 tablet (20 mEq total) by mouth daily.   No facility-administered medications prior to visit.        Objective    BP 117/78  Pulse 82   Resp 16   Ht 5\' 9"  (1.753 m)   Wt 154 lb 4.8 oz (70 kg)   SpO2 100%   BMI 22.79 kg/m     Physical Exam Vitals and nursing note reviewed.  Constitutional:      General: He is not in acute distress.    Appearance: Normal appearance.  HENT:     Head: Normocephalic and atraumatic.  Eyes:     General: No scleral icterus.    Conjunctiva/sclera: Conjunctivae normal.  Cardiovascular:     Rate and Rhythm: Normal rate.  Pulmonary:     Effort: Pulmonary effort is normal.  Neurological:     Mental Status: He is alert and oriented to person, place, and  time. Mental status is at baseline.  Psychiatric:        Mood and Affect: Mood normal.        Behavior: Behavior normal.      Results for orders placed or performed in visit on 08/25/23  Comprehensive metabolic panel  Result Value Ref Range   Glucose 100 (H) 70 - 99 mg/dL   BUN 7 (L) 8 - 27 mg/dL   Creatinine, Ser 4.09 0.76 - 1.27 mg/dL   eGFR 98 >81 XB/JYN/8.29   BUN/Creatinine Ratio 8 (L) 10 - 24   Sodium 139 134 - 144 mmol/L   Potassium 3.6 3.5 - 5.2 mmol/L   Chloride 101 96 - 106 mmol/L   CO2 21 20 - 29 mmol/L   Calcium 9.4 8.6 - 10.2 mg/dL   Total Protein 7.3 6.0 - 8.5 g/dL   Albumin 4.6 3.9 - 4.9 g/dL   Globulin, Total 2.7 1.5 - 4.5 g/dL   Bilirubin Total 0.2 0.0 - 1.2 mg/dL   Alkaline Phosphatase 92 44 - 121 IU/L   AST 17 0 - 40 IU/L   ALT 18 0 - 44 IU/L  Hemoglobin A1c  Result Value Ref Range   Hgb A1c MFr Bld 6.8 (H) 4.8 - 5.6 %   Est. average glucose Bld gHb Est-mCnc 148 mg/dL  Lipid panel  Result Value Ref Range   Cholesterol, Total 100 100 - 199 mg/dL   Triglycerides 562 0 - 149 mg/dL   HDL 40 >13 mg/dL   VLDL Cholesterol Cal 20 5 - 40 mg/dL   LDL Chol Calc (NIH) 40 0 - 99 mg/dL   Chol/HDL Ratio 2.5 0.0 - 5.0 ratio  Vitamin B12  Result Value Ref Range   Vitamin B-12 334 232 - 1,245 pg/mL    Assessment & Plan    Type 2 diabetes mellitus with hyperglycemia, without long-term current use of insulin (HCC) Assessment & Plan: Patient reports good blood glucose control with current regimen of Metformin 500mg  BID. Recent A1c was 7.6. -Check A1c, metabolic panel, and Vitamin B12 on or after 08/28/2023. -Continue Metformin 500mg  BID.  Orders: -     Hemoglobin A1c  Primary hypertension Assessment & Plan: Well controlled with current regimen. Recent BP 117/78. -Continue current antihypertensive regimen of carvedilol 3.125 mg twice daily and lisinopril 5 mg daily  Orders: -     Comprehensive metabolic panel -     Lipid panel  Prostate cancer  [facility]) Assessment & Plan: Localized to one spot, no evidence of metastasis on recent PET scan. Patient is scheduled to start radiation therapy on 09/02/2023. No plans for surgery due to patient's preference and medical history. -Continue current management plan with urology.   Nicotine dependence with current use Assessment & Plan:  Patient reports smoking approximately half a pack of cigarettes per day for over 40 years. No interest in quitting at this time. -Discussed lung cancer screening, but patient prefers to address after completion of prostate cancer treatment.    Lung cancer screening declined by patient  Hypokalemia Assessment & Plan: Taking potassium 10 mEq daily Will order recheck today.   High risk medication use -     Vitamin B12  Immunization due  Shingles Vaccination Discussed getting Shingrix vaccine at pharmacy due to Medicare coverage. -Advise patient to get Shingrix vaccine at pharmacy.  Return in about 3 months (around 11/25/2023) for DM, HTN.      I discussed the assessment and treatment plan with the patient  The patient was provided an opportunity to ask questions and all were answered. The patient agreed with the plan and demonstrated an understanding of the instructions.   The patient was advised to call back or seek an in-person evaluation if the symptoms worsen or if the condition fails to improve as anticipated.    Sherlyn Hay, DO  [facility] [phone] (phone) [phone] (fax)  [facility]

## 2023-08-25 NOTE — Assessment & Plan Note (Addendum)
Taking potassium 10 mEq daily Will order recheck today.

## 2023-08-27 ENCOUNTER — Encounter: Payer: Self-pay | Admitting: Family Medicine

## 2023-08-28 DIAGNOSIS — E1165 Type 2 diabetes mellitus with hyperglycemia: Secondary | ICD-10-CM | POA: Diagnosis not present

## 2023-08-28 DIAGNOSIS — I10 Essential (primary) hypertension: Secondary | ICD-10-CM | POA: Diagnosis not present

## 2023-08-28 DIAGNOSIS — Z79899 Other long term (current) drug therapy: Secondary | ICD-10-CM | POA: Diagnosis not present

## 2023-08-29 ENCOUNTER — Encounter: Payer: Self-pay | Admitting: Family Medicine

## 2023-08-29 LAB — COMPREHENSIVE METABOLIC PANEL
ALT: 18 [IU]/L (ref 0–44)
AST: 17 [IU]/L (ref 0–40)
Albumin: 4.6 g/dL (ref 3.9–4.9)
Alkaline Phosphatase: 92 [IU]/L (ref 44–121)
BUN/Creatinine Ratio: 8 — ABNORMAL LOW (ref 10–24)
BUN: 7 mg/dL — ABNORMAL LOW (ref 8–27)
Bilirubin Total: 0.2 mg/dL (ref 0.0–1.2)
CO2: 21 mmol/L (ref 20–29)
Calcium: 9.4 mg/dL (ref 8.6–10.2)
Chloride: 101 mmol/L (ref 96–106)
Creatinine, Ser: 0.88 mg/dL (ref 0.76–1.27)
Globulin, Total: 2.7 g/dL (ref 1.5–4.5)
Glucose: 100 mg/dL — ABNORMAL HIGH (ref 70–99)
Potassium: 3.6 mmol/L (ref 3.5–5.2)
Sodium: 139 mmol/L (ref 134–144)
Total Protein: 7.3 g/dL (ref 6.0–8.5)
eGFR: 98 mL/min/{1.73_m2} (ref >59)

## 2023-08-29 LAB — LIPID PANEL
Chol/HDL Ratio: 2.5 ratio (ref 0.0–5.0)
Cholesterol, Total: 100 mg/dL (ref 100–199)
HDL: 40 mg/dL (ref >39)
LDL Chol Calc (NIH): 40 mg/dL (ref 0–99)
Triglycerides: 104 mg/dL (ref 0–149)
VLDL Cholesterol Cal: 20 mg/dL (ref 5–40)

## 2023-08-29 LAB — VITAMIN B12: Vitamin B-12: 334 pg/mL (ref 232–1245)

## 2023-08-29 LAB — HEMOGLOBIN A1C
Est. average glucose Bld gHb Est-mCnc: 148 mg/dL
Hgb A1c MFr Bld: 6.8 % — ABNORMAL HIGH (ref 4.8–5.6)

## 2023-08-29 NOTE — Telephone Encounter (Signed)
I have scanned valid DL in media and gave [place] form

## 2023-09-02 ENCOUNTER — Ambulatory Visit
Admission: RE | Admit: 2023-09-02 | Discharge: 2023-09-02 | Disposition: A | Discharge status: Home / Self Care | Payer: Medicare HMO | Source: Ambulatory Visit | Attending: Radiation Oncology | Admitting: Radiation Oncology

## 2023-09-02 ENCOUNTER — Encounter: Payer: Self-pay | Admitting: Radiation Oncology

## 2023-09-02 VITALS — BP 136/87 | HR 76 | Temp 98.2°F | Resp 16 | Ht 69.0 in | Wt 155.5 lb

## 2023-09-02 DIAGNOSIS — F17210 Nicotine dependence, cigarettes, uncomplicated: Secondary | ICD-10-CM | POA: Diagnosis not present

## 2023-09-02 DIAGNOSIS — Z7982 Long term (current) use of aspirin: Secondary | ICD-10-CM | POA: Diagnosis not present

## 2023-09-02 DIAGNOSIS — J449 Chronic obstructive pulmonary disease, unspecified: Secondary | ICD-10-CM | POA: Insufficient documentation

## 2023-09-02 DIAGNOSIS — I509 Heart failure, unspecified: Secondary | ICD-10-CM | POA: Insufficient documentation

## 2023-09-02 DIAGNOSIS — Z79899 Other long term (current) drug therapy: Secondary | ICD-10-CM | POA: Insufficient documentation

## 2023-09-02 DIAGNOSIS — Z8673 Personal history of transient ischemic attack (TIA), and cerebral infarction without residual deficits: Secondary | ICD-10-CM | POA: Insufficient documentation

## 2023-09-02 DIAGNOSIS — C61 Malignant neoplasm of prostate: Secondary | ICD-10-CM | POA: Insufficient documentation

## 2023-09-02 DIAGNOSIS — I110 Hypertensive heart disease with heart failure: Secondary | ICD-10-CM | POA: Insufficient documentation

## 2023-09-02 DIAGNOSIS — I252 Old myocardial infarction: Secondary | ICD-10-CM | POA: Diagnosis not present

## 2023-09-02 DIAGNOSIS — Z7984 Long term (current) use of oral hypoglycemic drugs: Secondary | ICD-10-CM | POA: Diagnosis not present

## 2023-09-02 DIAGNOSIS — K219 Gastro-esophageal reflux disease without esophagitis: Secondary | ICD-10-CM | POA: Diagnosis not present

## 2023-09-02 DIAGNOSIS — Z191 Hormone sensitive malignancy status: Secondary | ICD-10-CM | POA: Diagnosis not present

## 2023-09-02 NOTE — Telephone Encounter (Signed)
Notified pt that is ready for pick up.

## 2023-09-02 NOTE — Consult Note (Signed)
NEW PATIENT EVALUATION  Name: Gary Benson  MRN: [id number]  Date:   09/02/2023     DOB: Mar 11, 1961   This 62 y.o. 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  REFERRING PHYSICIAN: Sherlyn Hay, DO  CHIEF COMPLAINT:  Chief Complaint  Patient presents with   Prostate Cancer    DIAGNOSIS: The encounter diagnosis was Malignant neoplasm of prostate (HCC).   PREVIOUS INVESTIGATIONS:  PSMA PET scan reviewed Pathology reports reviewed Clinical notes reviewed  HPI: Patient is a 62 year old male who presents with an elevated PSA of 33.8 back 6 months prior.  On rectal exam he had induration of the right lobe with no discrete nodules.  He underwent core biopsy showing 2 of 12 cores positive for Gleason 8 (4+4).  He underwent a PSMA PET scan showing intense radiotracer activity in the right lobe of the prostate consistent with primary prostate adenocarcinoma no evidence of metastatic adenopathy in the pelvis or periotic retroperitoneum or evidence of visceral or skeletal metastasis.  Dr. Apolinar Junes has discussed surgical options although the patient has a history of stent placement as well as prior CVA and is reluctant for surgery.  He is now seen for radiation oncology opinion.  He displayed specifically denies dysuria does have nocturia x 2-3 no rectal problems or bone pain.  PLANNED TREATMENT REGIMEN: ADT therapy plus external beam radiation therapy to prostate and pelvic nodes plus I-125 interstitial implant for boost  PAST MEDICAL HISTORY:  has a past medical history of Cancer (HCC), CHF (congestive heart failure) (HCC), COPD (chronic obstructive pulmonary disease) (HCC), GERD (gastroesophageal reflux disease), Hypertension, Myocardial infarction (HCC), and Stroke (HCC).    PAST SURGICAL HISTORY:  Past Surgical History:  Procedure Laterality Date    CAROTID STENT     CORONARY ANGIOPLASTY WITH STENT PLACEMENT     EYE SURGERY     HERNIA REPAIR Right     FAMILY HISTORY: family history includes Diabetes in his mother and sister; Heart disease in his father.  SOCIAL HISTORY:  reports that he has been smoking cigarettes. He started smoking about 40 years ago. He has a 40 pack-year smoking history. He has been exposed to tobacco smoke. He has never used smokeless tobacco. He reports that he does not drink alcohol and does not use drugs.  ALLERGIES: Bee venom  MEDICATIONS:  Current Outpatient Medications  Medication Sig Dispense Refill   acetaminophen (TYLENOL) 500 MG tablet Take 1,000 mg by mouth every morning.     aspirin 325 MG tablet Take 1 tablet (325 mg total) by mouth daily. 30 tablet 1   Blood Glucose Monitoring Suppl (TRUE METRIX AIR GLUCOSE METER) w/Device KIT USE AS DIRECTED 1 kit 3   carvedilol (COREG) 3.125 MG tablet TAKE 1 TABLET TWICE DAILY WITH MEALS 120 tablet 5   EPINEPHrine 0.3 mg/0.3 mL IJ SOAJ injection Inject 0.3 mg into the muscle as needed for anaphylaxis. 2 each 1   famotidine (PEPCID) 20 MG tablet Take 20 mg by mouth 2 (two) times daily.     gabapentin (NEURONTIN) 300 MG capsule Take 1 capsule (300 mg total) by mouth 3 (three) times daily. 90 capsule 3   Lancet Devices (TRUEDRAW LANCING DEVICE) MISC USE AS DIRECTED 1 each 3   lisinopril (ZESTRIL) 5 MG tablet TAKE 1 TABLET EVERY DAY 60 tablet 5   metFORMIN (GLUCOPHAGE-XR)  500 MG 24 hr tablet TAKE 2 TABLETS TWICE A DAY WITH MEALS 360 tablet 1   potassium chloride (KLOR-CON M) 10 MEQ tablet Take 1 tablet (10 mEq total) by mouth daily. 30 tablet 0   potassium chloride (KLOR-CON) 10 MEQ tablet TAKE 1 TABLET EVERY DAY 30 tablet 11   rosuvastatin (CRESTOR) 20 MG tablet Take 1 tablet (20 mg total) by mouth daily. 90 tablet 3   sertraline (ZOLOFT) 100 MG tablet TAKE 1 TABLET EVERY DAY 60 tablet 5   TRUE METRIX BLOOD GLUCOSE TEST test strip TEST BLOOD SUGAR IN THE  MORNING, AT NOON, AND AT BEDTIME 300 strip 3   TRUEplus Lancets 33G MISC TEST BLOOD SUGAR IN THE MORNING, AT NOON, AND AT BEDTIME 300 each 3   No current facility-administered medications for this encounter.    ECOG PERFORMANCE STATUS:  0 - Asymptomatic  REVIEW OF SYSTEMS: Patient has a history of congestive heart failure COPD hypertension myocardial infarction Patient denies any weight loss, fatigue, weakness, fever, chills or night sweats. Patient denies any loss of vision, blurred vision. Patient denies any ringing  of the ears or hearing loss. No irregular heartbeat. Patient denies heart murmur or history of fainting. Patient denies any chest pain or pain radiating to her upper extremities. Patient denies any shortness of breath, difficulty breathing at night, cough or hemoptysis. Patient denies any swelling in the lower legs. Patient denies any nausea vomiting, vomiting of blood, or coffee ground material in the vomitus. Patient denies any stomach pain. Patient states has had normal bowel movements no significant constipation or diarrhea. Patient denies any dysuria, hematuria or significant nocturia. Patient denies any problems walking, swelling in the joints or loss of balance. Patient denies any skin changes, loss of hair or loss of weight. Patient denies any excessive worrying or anxiety or significant depression. Patient denies any problems with insomnia. Patient denies excessive thirst, polyuria, polydipsia. Patient denies any swollen glands, patient denies easy bruising or easy bleeding. Patient denies any recent infections, allergies or URI. Patient "s visual fields have not changed significantly in recent time.   PHYSICAL EXAM: BP 136/87   Pulse 76   Temp 98.2 F (36.8 C) (Tympanic)   Resp 16   Ht 5\' 9"  (1.753 m) Comment: stated HT  Wt 155 lb 8 oz (70.5 kg)   BMI 22.96 kg/m  Well-developed well-nourished patient in NAD. HEENT reveals PERLA, EOMI, discs not visualized.  Oral cavity  is clear. No oral mucosal lesions are identified. Neck is clear without evidence of cervical or supraclavicular adenopathy. Lungs are clear to A&P. Cardiac examination is essentially unremarkable with regular rate and rhythm without murmur rub or thrill. Abdomen is benign with no organomegaly or masses noted. Motor sensory and DTR levels are equal and symmetric in the upper and lower extremities. Cranial nerves II through XII are grossly intact. Proprioception is intact. No peripheral adenopathy or edema is identified. No motor or sensory levels are noted. Crude visual fields are within normal range.  LABORATORY DATA: Pathology reports reviewed    RADIOLOGY RESULTS: PSMA PET scan reviewed compatible with above-stated findings   IMPRESSION: Stage IIIa Gleason 8 adenocarcinoma the prostate presenting with a PSA in the 30 range in 62 year old male  PLAN: At this time I have recommended trimodality treatment including ADT therapy as well as external beam radiation therapy to his prostate and pelvic nodes and I-125 interstitial implant for boost.  I have run the [facility] nomogram showing a close to 30%  chance of pelvic lymph node involvement is why I am covering his pelvic nodes.  Risks and benefits of treatment occluding increased lower urinary tract symptoms diarrhea fatigue alteration blood counts skin reaction risks associated with implant including radiation safety precautions all were reviewed in detail with the patient.  He comprehends my treatment plan well.  I am returning him to Dr. Apolinar Junes for commencement of Eligard and we will start planning IMRT radiation therapy to his prostate and pelvic nodes.  Patient comprehends my recommendations well.  I would like to take this opportunity to thank you for allowing me to participate in the care of your patient.Carmina Miller, MD

## 2023-09-03 ENCOUNTER — Other Ambulatory Visit: Payer: Self-pay | Admitting: Radiation Oncology

## 2023-09-03 DIAGNOSIS — C801 Malignant (primary) neoplasm, unspecified: Secondary | ICD-10-CM

## 2023-09-07 DIAGNOSIS — F17200 Nicotine dependence, unspecified, uncomplicated: Secondary | ICD-10-CM | POA: Insufficient documentation

## 2023-09-07 NOTE — Assessment & Plan Note (Signed)
Patient reports good blood glucose control with current regimen of Metformin 500mg  BID. Recent A1c was 7.6. -Check A1c, metabolic panel, and Vitamin B12 on or after 08/28/2023. -Continue Metformin 500mg  BID.

## 2023-09-07 NOTE — Assessment & Plan Note (Signed)
Well controlled with current regimen. Recent BP 117/78. -Continue current antihypertensive regimen of carvedilol 3.125 mg twice daily and lisinopril 5 mg daily

## 2023-09-07 NOTE — Assessment & Plan Note (Signed)
Patient reports smoking approximately half a pack of cigarettes per day for over 40 years. No interest in quitting at this time. -Discussed lung cancer screening, but patient prefers to address after completion of prostate cancer treatment.

## 2023-09-07 NOTE — Assessment & Plan Note (Signed)
Localized to one spot, no evidence of metastasis on recent PET scan. Patient is scheduled to start radiation therapy on 09/02/2023. No plans for surgery due to patient's preference and medical history. -Continue current management plan with urology.

## 2023-09-08 ENCOUNTER — Telehealth: Payer: Self-pay

## 2023-09-08 DIAGNOSIS — C61 Malignant neoplasm of prostate: Secondary | ICD-10-CM | POA: Diagnosis not present

## 2023-09-08 DIAGNOSIS — Z191 Hormone sensitive malignancy status: Secondary | ICD-10-CM | POA: Diagnosis not present

## 2023-09-08 NOTE — Telephone Encounter (Signed)
No PA required

## 2023-09-08 NOTE — Telephone Encounter (Signed)
-----   Message from [facility] Crystal M sent at 09/03/2023  9:39 AM EST ----- Appt made for 10/15/23.   Thanks. ----- Message ----- From: Vanita Panda, RN Sent: 09/02/2023  10:35 AM EST To: Darol Destine, CMA; Debbe Bales, CMA  Please schedule patient for Eligard injection.  Thanks, [employer]

## 2023-09-12 ENCOUNTER — Ambulatory Visit
Admission: RE | Admit: 2023-09-12 | Discharge: 2023-09-12 | Disposition: A | Discharge status: Home / Self Care | Payer: Medicare HMO | Source: Ambulatory Visit | Attending: Radiation Oncology | Admitting: Radiation Oncology

## 2023-09-12 DIAGNOSIS — Z191 Hormone sensitive malignancy status: Secondary | ICD-10-CM | POA: Diagnosis not present

## 2023-09-12 DIAGNOSIS — C801 Malignant (primary) neoplasm, unspecified: Secondary | ICD-10-CM

## 2023-09-12 DIAGNOSIS — Z510 Encounter for antineoplastic radiation therapy: Secondary | ICD-10-CM | POA: Insufficient documentation

## 2023-09-12 DIAGNOSIS — C61 Malignant neoplasm of prostate: Secondary | ICD-10-CM | POA: Diagnosis not present

## 2023-09-17 DIAGNOSIS — Z191 Hormone sensitive malignancy status: Secondary | ICD-10-CM | POA: Diagnosis not present

## 2023-09-17 DIAGNOSIS — Z510 Encounter for antineoplastic radiation therapy: Secondary | ICD-10-CM | POA: Diagnosis not present

## 2023-09-17 DIAGNOSIS — C61 Malignant neoplasm of prostate: Secondary | ICD-10-CM | POA: Diagnosis not present

## 2023-09-18 ENCOUNTER — Other Ambulatory Visit: Payer: Self-pay | Admitting: *Deleted

## 2023-09-18 DIAGNOSIS — C61 Malignant neoplasm of prostate: Secondary | ICD-10-CM

## 2023-09-22 ENCOUNTER — Ambulatory Visit
Admission: RE | Admit: 2023-09-22 | Discharge: 2023-09-22 | Disposition: A | Discharge status: Home / Self Care | Payer: Medicare HMO | Source: Ambulatory Visit | Attending: Radiation Oncology | Admitting: Radiation Oncology

## 2023-09-23 ENCOUNTER — Ambulatory Visit
Admission: RE | Admit: 2023-09-23 | Discharge: 2023-09-23 | Disposition: A | Discharge status: Home / Self Care | Payer: Medicare HMO | Source: Ambulatory Visit | Attending: Radiation Oncology | Admitting: Radiation Oncology

## 2023-09-23 ENCOUNTER — Other Ambulatory Visit: Payer: Self-pay

## 2023-09-23 DIAGNOSIS — C61 Malignant neoplasm of prostate: Secondary | ICD-10-CM | POA: Diagnosis not present

## 2023-09-23 DIAGNOSIS — Z510 Encounter for antineoplastic radiation therapy: Secondary | ICD-10-CM | POA: Diagnosis not present

## 2023-09-23 DIAGNOSIS — Z191 Hormone sensitive malignancy status: Secondary | ICD-10-CM | POA: Diagnosis not present

## 2023-09-23 LAB — RAD ONC ARIA SESSION SUMMARY
Course Elapsed Days: 0
Plan Fractions Treated to Date: 1
Plan Prescribed Dose Per Fraction: 1.8 Gy
Plan Total Fractions Prescribed: 25
Plan Total Prescribed Dose: 45 Gy
Reference Point Dosage Given to Date: 1.8 Gy
Reference Point Session Dosage Given: 1.8 Gy
Session Number: 1

## 2023-09-24 ENCOUNTER — Other Ambulatory Visit: Payer: Self-pay

## 2023-09-24 ENCOUNTER — Ambulatory Visit
Admission: RE | Admit: 2023-09-24 | Discharge: 2023-09-24 | Disposition: A | Discharge status: Home / Self Care | Payer: Medicare HMO | Source: Ambulatory Visit | Attending: Radiation Oncology | Admitting: Radiation Oncology

## 2023-09-24 DIAGNOSIS — C61 Malignant neoplasm of prostate: Secondary | ICD-10-CM | POA: Diagnosis not present

## 2023-09-24 DIAGNOSIS — Z510 Encounter for antineoplastic radiation therapy: Secondary | ICD-10-CM | POA: Diagnosis not present

## 2023-09-24 DIAGNOSIS — Z191 Hormone sensitive malignancy status: Secondary | ICD-10-CM | POA: Diagnosis not present

## 2023-09-24 LAB — RAD ONC ARIA SESSION SUMMARY
Course Elapsed Days: 1
Plan Fractions Treated to Date: 2
Plan Prescribed Dose Per Fraction: 1.8 Gy
Plan Total Fractions Prescribed: 25
Plan Total Prescribed Dose: 45 Gy
Reference Point Dosage Given to Date: 3.6 Gy
Reference Point Session Dosage Given: 1.8 Gy
Session Number: 2

## 2023-09-25 ENCOUNTER — Ambulatory Visit
Admission: RE | Admit: 2023-09-25 | Discharge: 2023-09-25 | Disposition: A | Discharge status: Home / Self Care | Payer: Medicare HMO | Source: Ambulatory Visit | Attending: Radiation Oncology | Admitting: Radiation Oncology

## 2023-09-25 ENCOUNTER — Other Ambulatory Visit: Payer: Self-pay

## 2023-09-25 DIAGNOSIS — C61 Malignant neoplasm of prostate: Secondary | ICD-10-CM | POA: Diagnosis not present

## 2023-09-25 DIAGNOSIS — Z510 Encounter for antineoplastic radiation therapy: Secondary | ICD-10-CM | POA: Diagnosis not present

## 2023-09-25 DIAGNOSIS — Z191 Hormone sensitive malignancy status: Secondary | ICD-10-CM | POA: Diagnosis not present

## 2023-09-25 LAB — RAD ONC ARIA SESSION SUMMARY
Course Elapsed Days: 2
Plan Fractions Treated to Date: 3
Plan Prescribed Dose Per Fraction: 1.8 Gy
Plan Total Fractions Prescribed: 25
Plan Total Prescribed Dose: 45 Gy
Reference Point Dosage Given to Date: 5.4 Gy
Reference Point Session Dosage Given: 1.8 Gy
Session Number: 3

## 2023-09-26 ENCOUNTER — Other Ambulatory Visit: Payer: Self-pay

## 2023-09-26 ENCOUNTER — Ambulatory Visit
Admission: RE | Admit: 2023-09-26 | Discharge: 2023-09-26 | Disposition: A | Discharge status: Home / Self Care | Payer: Medicare HMO | Source: Ambulatory Visit | Attending: Radiation Oncology | Admitting: Radiation Oncology

## 2023-09-26 DIAGNOSIS — Z510 Encounter for antineoplastic radiation therapy: Secondary | ICD-10-CM | POA: Diagnosis not present

## 2023-09-26 DIAGNOSIS — Z191 Hormone sensitive malignancy status: Secondary | ICD-10-CM | POA: Diagnosis not present

## 2023-09-26 DIAGNOSIS — C61 Malignant neoplasm of prostate: Secondary | ICD-10-CM | POA: Diagnosis not present

## 2023-09-26 LAB — RAD ONC ARIA SESSION SUMMARY
Course Elapsed Days: 3
Plan Fractions Treated to Date: 4
Plan Prescribed Dose Per Fraction: 1.8 Gy
Plan Total Fractions Prescribed: 25
Plan Total Prescribed Dose: 45 Gy
Reference Point Dosage Given to Date: 7.2 Gy
Reference Point Session Dosage Given: 1.8 Gy
Session Number: 4

## 2023-09-29 ENCOUNTER — Ambulatory Visit
Admission: RE | Admit: 2023-09-29 | Discharge: 2023-09-29 | Disposition: A | Discharge status: Home / Self Care | Payer: Medicare HMO | Source: Ambulatory Visit | Attending: Radiation Oncology | Admitting: Radiation Oncology

## 2023-09-29 ENCOUNTER — Inpatient Hospital Stay: Payer: Medicare HMO

## 2023-09-29 ENCOUNTER — Other Ambulatory Visit: Payer: Self-pay

## 2023-09-29 DIAGNOSIS — Z510 Encounter for antineoplastic radiation therapy: Secondary | ICD-10-CM | POA: Diagnosis not present

## 2023-09-29 DIAGNOSIS — Z191 Hormone sensitive malignancy status: Secondary | ICD-10-CM | POA: Diagnosis not present

## 2023-09-29 DIAGNOSIS — C61 Malignant neoplasm of prostate: Secondary | ICD-10-CM | POA: Insufficient documentation

## 2023-09-29 LAB — CBC (CANCER CENTER ONLY)
HCT: 36.3 % — ABNORMAL LOW (ref 39.0–52.0)
Hemoglobin: 12.3 g/dL — ABNORMAL LOW (ref 13.0–17.0)
MCH: 30.1 pg (ref 26.0–34.0)
MCHC: 33.9 g/dL (ref 30.0–36.0)
MCV: 89 fL (ref 80.0–100.0)
Platelet Count: 214 10*3/uL (ref 150–400)
RBC: 4.08 MIL/uL — ABNORMAL LOW (ref 4.22–5.81)
RDW: 12.8 % (ref 11.5–15.5)
WBC Count: 8 10*3/uL (ref 4.0–10.5)
nRBC: 0 % (ref 0.0–0.2)

## 2023-09-29 LAB — RAD ONC ARIA SESSION SUMMARY
Course Elapsed Days: 6
Plan Fractions Treated to Date: 5
Plan Prescribed Dose Per Fraction: 1.8 Gy
Plan Total Fractions Prescribed: 25
Plan Total Prescribed Dose: 45 Gy
Reference Point Dosage Given to Date: 9 Gy
Reference Point Session Dosage Given: 1.8 Gy
Session Number: 5

## 2023-09-30 ENCOUNTER — Ambulatory Visit
Admission: RE | Admit: 2023-09-30 | Discharge: 2023-09-30 | Disposition: A | Discharge status: Home / Self Care | Payer: Medicare HMO | Source: Ambulatory Visit | Attending: Radiation Oncology | Admitting: Radiation Oncology

## 2023-09-30 ENCOUNTER — Other Ambulatory Visit: Payer: Self-pay

## 2023-09-30 DIAGNOSIS — Z191 Hormone sensitive malignancy status: Secondary | ICD-10-CM | POA: Diagnosis not present

## 2023-09-30 DIAGNOSIS — C61 Malignant neoplasm of prostate: Secondary | ICD-10-CM | POA: Diagnosis not present

## 2023-09-30 DIAGNOSIS — Z510 Encounter for antineoplastic radiation therapy: Secondary | ICD-10-CM | POA: Diagnosis not present

## 2023-09-30 LAB — RAD ONC ARIA SESSION SUMMARY
Course Elapsed Days: 7
Plan Fractions Treated to Date: 6
Plan Prescribed Dose Per Fraction: 1.8 Gy
Plan Total Fractions Prescribed: 25
Plan Total Prescribed Dose: 45 Gy
Reference Point Dosage Given to Date: 10.8 Gy
Reference Point Session Dosage Given: 1.8 Gy
Session Number: 6

## 2023-10-02 ENCOUNTER — Ambulatory Visit
Admission: RE | Admit: 2023-10-02 | Discharge: 2023-10-02 | Disposition: A | Discharge status: Home / Self Care | Payer: Medicare HMO | Source: Ambulatory Visit | Attending: Radiation Oncology | Admitting: Radiation Oncology

## 2023-10-02 ENCOUNTER — Other Ambulatory Visit: Payer: Self-pay

## 2023-10-02 DIAGNOSIS — C61 Malignant neoplasm of prostate: Secondary | ICD-10-CM

## 2023-10-02 DIAGNOSIS — Z191 Hormone sensitive malignancy status: Secondary | ICD-10-CM | POA: Diagnosis not present

## 2023-10-02 DIAGNOSIS — Z510 Encounter for antineoplastic radiation therapy: Secondary | ICD-10-CM | POA: Diagnosis not present

## 2023-10-02 LAB — RAD ONC ARIA SESSION SUMMARY
Course Elapsed Days: 9
Plan Fractions Treated to Date: 7
Plan Prescribed Dose Per Fraction: 1.8 Gy
Plan Total Fractions Prescribed: 25
Plan Total Prescribed Dose: 45 Gy
Reference Point Dosage Given to Date: 12.6 Gy
Reference Point Session Dosage Given: 1.8 Gy
Session Number: 7

## 2023-10-02 NOTE — Progress Notes (Signed)
Brachytherapy Standing Order Form:  Part 1 Volume Study:  Date: 10/29/2023  CPT: 78295, 62130 Procedure: Prostate Volume Study  Part 2 Seed Implant:   Surgeon: [name], MD  Date: 11/24/2023    CPT code: 86578, 46962, [phone] Procedure: Brachytherapy Radioactive seed implant  Anesthesia: General  VTE: SCD's  Standing Orders:  -Ancef 2G IV,  -Fleets 2 hr prior, -NPO after midnight,  -UA & Culture

## 2023-10-03 ENCOUNTER — Ambulatory Visit
Admission: RE | Admit: 2023-10-03 | Discharge: 2023-10-03 | Disposition: A | Discharge status: Home / Self Care | Payer: Medicare HMO | Source: Ambulatory Visit | Attending: Radiation Oncology | Admitting: Radiation Oncology

## 2023-10-03 ENCOUNTER — Other Ambulatory Visit: Payer: Self-pay

## 2023-10-03 DIAGNOSIS — Z191 Hormone sensitive malignancy status: Secondary | ICD-10-CM | POA: Diagnosis not present

## 2023-10-03 DIAGNOSIS — C61 Malignant neoplasm of prostate: Secondary | ICD-10-CM | POA: Diagnosis not present

## 2023-10-03 DIAGNOSIS — Z510 Encounter for antineoplastic radiation therapy: Secondary | ICD-10-CM | POA: Diagnosis not present

## 2023-10-03 LAB — RAD ONC ARIA SESSION SUMMARY
Course Elapsed Days: 10
Plan Fractions Treated to Date: 8
Plan Prescribed Dose Per Fraction: 1.8 Gy
Plan Total Fractions Prescribed: 25
Plan Total Prescribed Dose: 45 Gy
Reference Point Dosage Given to Date: 14.4 Gy
Reference Point Session Dosage Given: 1.8 Gy
Session Number: 8

## 2023-10-06 ENCOUNTER — Other Ambulatory Visit: Payer: Self-pay

## 2023-10-06 ENCOUNTER — Ambulatory Visit: Payer: Medicare HMO | Admitting: Cardiology

## 2023-10-06 ENCOUNTER — Ambulatory Visit
Admission: RE | Admit: 2023-10-06 | Discharge: 2023-10-06 | Discharge status: Home / Self Care | Payer: Medicare HMO | Source: Ambulatory Visit | Attending: Radiation Oncology | Admitting: Radiation Oncology

## 2023-10-06 DIAGNOSIS — Z191 Hormone sensitive malignancy status: Secondary | ICD-10-CM | POA: Diagnosis not present

## 2023-10-06 DIAGNOSIS — C61 Malignant neoplasm of prostate: Secondary | ICD-10-CM | POA: Diagnosis not present

## 2023-10-06 DIAGNOSIS — Z510 Encounter for antineoplastic radiation therapy: Secondary | ICD-10-CM | POA: Diagnosis not present

## 2023-10-06 LAB — RAD ONC ARIA SESSION SUMMARY
Course Elapsed Days: 13
Plan Fractions Treated to Date: 9
Plan Prescribed Dose Per Fraction: 1.8 Gy
Plan Total Fractions Prescribed: 25
Plan Total Prescribed Dose: 45 Gy
Reference Point Dosage Given to Date: 16.2 Gy
Reference Point Session Dosage Given: 1.8 Gy
Session Number: 9

## 2023-10-07 ENCOUNTER — Ambulatory Visit
Admission: RE | Admit: 2023-10-07 | Discharge: 2023-10-07 | Discharge status: Home / Self Care | Payer: Medicare HMO | Source: Ambulatory Visit | Attending: Radiation Oncology | Admitting: Radiation Oncology

## 2023-10-07 ENCOUNTER — Other Ambulatory Visit: Payer: Self-pay

## 2023-10-07 DIAGNOSIS — Z510 Encounter for antineoplastic radiation therapy: Secondary | ICD-10-CM | POA: Diagnosis not present

## 2023-10-07 DIAGNOSIS — C61 Malignant neoplasm of prostate: Secondary | ICD-10-CM | POA: Diagnosis not present

## 2023-10-07 DIAGNOSIS — Z191 Hormone sensitive malignancy status: Secondary | ICD-10-CM | POA: Diagnosis not present

## 2023-10-07 LAB — RAD ONC ARIA SESSION SUMMARY
Course Elapsed Days: 14
Plan Fractions Treated to Date: 10
Plan Prescribed Dose Per Fraction: 1.8 Gy
Plan Total Fractions Prescribed: 25
Plan Total Prescribed Dose: 45 Gy
Reference Point Dosage Given to Date: 18 Gy
Reference Point Session Dosage Given: 1.8 Gy
Session Number: 10

## 2023-10-09 ENCOUNTER — Other Ambulatory Visit: Payer: Self-pay

## 2023-10-09 ENCOUNTER — Ambulatory Visit
Admission: RE | Admit: 2023-10-09 | Discharge: 2023-10-09 | Disposition: A | Discharge status: Home / Self Care | Payer: Medicare HMO | Source: Ambulatory Visit | Attending: Radiation Oncology | Admitting: Radiation Oncology

## 2023-10-09 DIAGNOSIS — C61 Malignant neoplasm of prostate: Secondary | ICD-10-CM | POA: Diagnosis not present

## 2023-10-09 DIAGNOSIS — Z191 Hormone sensitive malignancy status: Secondary | ICD-10-CM | POA: Diagnosis not present

## 2023-10-09 DIAGNOSIS — Z510 Encounter for antineoplastic radiation therapy: Secondary | ICD-10-CM | POA: Insufficient documentation

## 2023-10-09 LAB — RAD ONC ARIA SESSION SUMMARY
Course Elapsed Days: 16
Plan Fractions Treated to Date: 11
Plan Prescribed Dose Per Fraction: 1.8 Gy
Plan Total Fractions Prescribed: 25
Plan Total Prescribed Dose: 45 Gy
Reference Point Dosage Given to Date: 19.8 Gy
Reference Point Session Dosage Given: 1.8 Gy
Session Number: 11

## 2023-10-10 ENCOUNTER — Ambulatory Visit
Admission: RE | Admit: 2023-10-10 | Discharge: 2023-10-10 | Disposition: A | Discharge status: Home / Self Care | Payer: Medicare HMO | Source: Ambulatory Visit | Attending: Radiation Oncology | Admitting: Radiation Oncology

## 2023-10-10 ENCOUNTER — Other Ambulatory Visit: Payer: Self-pay

## 2023-10-10 DIAGNOSIS — Z191 Hormone sensitive malignancy status: Secondary | ICD-10-CM | POA: Diagnosis not present

## 2023-10-10 DIAGNOSIS — Z510 Encounter for antineoplastic radiation therapy: Secondary | ICD-10-CM | POA: Diagnosis not present

## 2023-10-10 DIAGNOSIS — C61 Malignant neoplasm of prostate: Secondary | ICD-10-CM | POA: Diagnosis not present

## 2023-10-10 LAB — RAD ONC ARIA SESSION SUMMARY
Course Elapsed Days: 17
Plan Fractions Treated to Date: 12
Plan Prescribed Dose Per Fraction: 1.8 Gy
Plan Total Fractions Prescribed: 25
Plan Total Prescribed Dose: 45 Gy
Reference Point Dosage Given to Date: 21.6 Gy
Reference Point Session Dosage Given: 1.8 Gy
Session Number: 12

## 2023-10-13 ENCOUNTER — Ambulatory Visit
Admission: RE | Admit: 2023-10-13 | Discharge: 2023-10-13 | Disposition: A | Discharge status: Home / Self Care | Payer: Medicare HMO | Source: Ambulatory Visit | Attending: Radiation Oncology | Admitting: Radiation Oncology

## 2023-10-13 ENCOUNTER — Other Ambulatory Visit: Payer: Self-pay

## 2023-10-13 DIAGNOSIS — C61 Malignant neoplasm of prostate: Secondary | ICD-10-CM | POA: Diagnosis not present

## 2023-10-13 DIAGNOSIS — Z510 Encounter for antineoplastic radiation therapy: Secondary | ICD-10-CM | POA: Diagnosis not present

## 2023-10-13 DIAGNOSIS — Z191 Hormone sensitive malignancy status: Secondary | ICD-10-CM | POA: Diagnosis not present

## 2023-10-13 LAB — RAD ONC ARIA SESSION SUMMARY
Course Elapsed Days: 20
Plan Fractions Treated to Date: 13
Plan Prescribed Dose Per Fraction: 1.8 Gy
Plan Total Fractions Prescribed: 25
Plan Total Prescribed Dose: 45 Gy
Reference Point Dosage Given to Date: 23.4 Gy
Reference Point Session Dosage Given: 1.8 Gy
Session Number: 13

## 2023-10-14 ENCOUNTER — Other Ambulatory Visit: Payer: Self-pay

## 2023-10-14 ENCOUNTER — Ambulatory Visit
Admission: RE | Admit: 2023-10-14 | Discharge: 2023-10-14 | Disposition: A | Discharge status: Home / Self Care | Payer: Medicare HMO | Source: Ambulatory Visit | Attending: Radiation Oncology | Admitting: Radiation Oncology

## 2023-10-14 DIAGNOSIS — C61 Malignant neoplasm of prostate: Secondary | ICD-10-CM | POA: Diagnosis not present

## 2023-10-14 DIAGNOSIS — Z510 Encounter for antineoplastic radiation therapy: Secondary | ICD-10-CM | POA: Diagnosis not present

## 2023-10-14 DIAGNOSIS — Z191 Hormone sensitive malignancy status: Secondary | ICD-10-CM | POA: Diagnosis not present

## 2023-10-14 LAB — RAD ONC ARIA SESSION SUMMARY
Course Elapsed Days: 21
Plan Fractions Treated to Date: 14
Plan Prescribed Dose Per Fraction: 1.8 Gy
Plan Total Fractions Prescribed: 25
Plan Total Prescribed Dose: 45 Gy
Reference Point Dosage Given to Date: 25.2 Gy
Reference Point Session Dosage Given: 1.8 Gy
Session Number: 14

## 2023-10-15 ENCOUNTER — Other Ambulatory Visit: Payer: Self-pay

## 2023-10-15 ENCOUNTER — Ambulatory Visit: Payer: Medicare HMO | Admitting: Urology

## 2023-10-15 ENCOUNTER — Ambulatory Visit
Admission: RE | Admit: 2023-10-15 | Discharge: 2023-10-15 | Disposition: A | Discharge status: Home / Self Care | Payer: Medicare HMO | Source: Ambulatory Visit | Attending: Radiation Oncology | Admitting: Radiation Oncology

## 2023-10-15 VITALS — BP 112/74 | HR 86

## 2023-10-15 DIAGNOSIS — Z191 Hormone sensitive malignancy status: Secondary | ICD-10-CM | POA: Diagnosis not present

## 2023-10-15 DIAGNOSIS — C61 Malignant neoplasm of prostate: Secondary | ICD-10-CM | POA: Diagnosis not present

## 2023-10-15 DIAGNOSIS — Z510 Encounter for antineoplastic radiation therapy: Secondary | ICD-10-CM | POA: Diagnosis not present

## 2023-10-15 LAB — RAD ONC ARIA SESSION SUMMARY
Course Elapsed Days: 22
Plan Fractions Treated to Date: 15
Plan Prescribed Dose Per Fraction: 1.8 Gy
Plan Total Fractions Prescribed: 25
Plan Total Prescribed Dose: 45 Gy
Reference Point Dosage Given to Date: 27 Gy
Reference Point Session Dosage Given: 1.8 Gy
Session Number: 15

## 2023-10-15 MED ORDER — LEUPROLIDE ACETATE (6 MONTH) 45 MG ~~LOC~~ KIT
45.0000 mg | PACK | Freq: Once | SUBCUTANEOUS | Status: AC
  Administered 2023-10-15: 45 mg via SUBCUTANEOUS

## 2023-10-15 NOTE — Progress Notes (Signed)
 Gary Benson,Gary Gary a scribe for Rosina Riis, Gary Benson.,Gary documented all relevant documentation on the behalf of Rosina Riis, Gary Benson,Gary directed by  Rosina Riis, Gary Benson while in the presence of Rosina Riis, Gary Benson.  10/15/23 4:46 PM   Gary Benson 07-Feb-1961 [id number]  Referring provider: Donzella Lauraine SAILOR, DO [address] [place],  [place] [address]  Chief Complaint  Patient presents with   Prostate Cancer    HPI: 63 year-old male who presents today for follow up of prostate cancer.    Please see previous note for details. He initially presented with a PSA of 33.8. He was diagnosed with high-risk prostate cancer, Gleason 4+3 on the right side. TRUS volume 28.6.   In the interim, he has undergone PET scan that shows intense radioactivity of the right prostate gland consistent with his primary adenocarcinoma, but no evidence of metastatic disease or lymphadenopathy.    Since his last visit, he saw Dr. Lenn and has elected to undergo IMRT with brachy-boost Gary well. He is currently undergoing daily radiation therapy. He is scheduled for seed placement on February 17th. Hormone treatment is to be initiated today, with injections every six months for at least two years due to high PSA levels. He reports experiencing occasional diarrhea and fatigue Gary side effects of radiation therapy.   PMH: Past Medical History:  Diagnosis Date   Cancer (HCC)    CHF (congestive heart failure) (HCC)    COPD (chronic obstructive pulmonary disease) (HCC)    GERD (gastroesophageal reflux disease)    Hypertension    Myocardial infarction (HCC)    Stroke [facility])     Surgical History: Past Surgical History:  Procedure Laterality Date   CAROTID STENT     CORONARY ANGIOPLASTY WITH STENT PLACEMENT     EYE SURGERY     HERNIA REPAIR Right     Home Medications:  Allergies Gary of 10/15/2023       Reactions   Bee Venom Anaphylaxis        Medication List        Accurate Gary  of October 15, 2023  4:46 PM. If you Gary any questions, ask your nurse or doctor.          acetaminophen  500 MG tablet Commonly known Gary: TYLENOL  Take 1,000 mg by mouth every morning.   aspirin  325 MG tablet Take 1 tablet (325 mg total) by mouth daily.   carvedilol  3.125 MG tablet Commonly known Gary: COREG  TAKE 1 TABLET TWICE DAILY WITH MEALS   EPINEPHrine  0.3 mg/0.3 mL Soaj injection Commonly known Gary: EPI-PEN Inject 0.3 mg into the muscle Gary needed for anaphylaxis.   famotidine 20 MG tablet Commonly known Gary: PEPCID Take 20 mg by mouth 2 (two) times daily.   gabapentin  300 MG capsule Commonly known Gary: NEURONTIN  Take 1 capsule (300 mg total) by mouth 3 (three) times daily.   lisinopril  5 MG tablet Commonly known Gary: ZESTRIL  TAKE 1 TABLET EVERY DAY   metFORMIN  500 MG 24 hr tablet Commonly known Gary: GLUCOPHAGE -XR TAKE 2 TABLETS TWICE A DAY WITH MEALS   potassium chloride  10 MEQ tablet Commonly known Gary: KLOR-CON  TAKE 1 TABLET EVERY DAY   rosuvastatin  20 MG tablet Commonly known Gary: Crestor  Take 1 tablet (20 mg total) by mouth daily.   sertraline  100 MG tablet Commonly known Gary: ZOLOFT  TAKE 1 TABLET EVERY DAY   True Metrix Air Glucose Meter w/Device Kit USE Gary DIRECTED   True Metrix Blood Glucose  Test test strip Generic drug: glucose blood TEST BLOOD SUGAR IN THE MORNING, AT NOON, AND AT BEDTIME   TRUEdraw Lancing Device Misc USE Gary DIRECTED   TRUEplus Lancets 33G Misc TEST BLOOD SUGAR IN THE MORNING, AT NOON, AND AT BEDTIME        Allergies:  Allergies  Allergen Reactions   Bee Venom Anaphylaxis    Family History: Family History  Problem Relation Age of Onset   Diabetes Mother    Heart disease Father    Diabetes Sister     Social History:  reports that he has been smoking cigarettes. He started smoking about 40 years ago. He has a 40.1 pack-year smoking history. He has been exposed to tobacco smoke. He has never used smokeless tobacco. He  reports that he does not drink alcohol and does not use drugs.   Physical Exam: BP 112/74   Pulse 86   Constitutional:  Alert and oriented, No acute distress. HEENT: [place] AT, moist mucus membranes.  Trachea midline, no masses. Neurologic: Grossly intact, no focal deficits, moving all 4 extremities. Psychiatric: Normal mood and affect.   Assessment & Plan:    1. Prostate cancer - Eligard  injection today - Discussed potential side effects of hormone therapy, including hot flashes, fatigue, decreased libido, weight gain, and bone thinning. - Recommended starting calcium  and vitamin D  supplements to counteract bone thinning. - Advised engaging in weight-bearing exercises to maintain muscle and bone health.  Return in about 4 weeks (around 11/12/2023) for seed placement.  I Gary reviewed the above documentation for accuracy and completeness, and I agree with the above.   Rosina Riis, Gary Benson    [facility] [address] [place], [place] [address] [phone]

## 2023-10-15 NOTE — Progress Notes (Signed)
 Eligard  SubQ Injection   Due to Prostate Cancer patient is present today for a Eligard  Injection.  Medication: Eligard  6 month Dose: 45 mg  Location: right UOQ Lot: [id number] Exp: 12/2024  Patient tolerated well, no complications were noted  Performed by: Artrell Lawless H RMA  Per Dr. Penne patient is to continue therapy until 10/2025 or 10/2026 . Patient's next follow up was scheduled for 04/07/2024. This appointment was scheduled using wheel and given to patient today along with reminder continue on Vitamin D  800-1000iu and Calcium  1000-1200mg  daily while on Androgen Deprivation Therapy.  PA approval dates: NO PA required

## 2023-10-16 ENCOUNTER — Ambulatory Visit
Admission: RE | Admit: 2023-10-16 | Discharge: 2023-10-16 | Disposition: A | Discharge status: Home / Self Care | Payer: Medicare HMO | Source: Ambulatory Visit | Attending: Radiation Oncology | Admitting: Radiation Oncology

## 2023-10-16 ENCOUNTER — Other Ambulatory Visit: Payer: Self-pay | Admitting: Radiation Oncology

## 2023-10-16 ENCOUNTER — Other Ambulatory Visit: Payer: Self-pay

## 2023-10-16 DIAGNOSIS — Z191 Hormone sensitive malignancy status: Secondary | ICD-10-CM | POA: Diagnosis not present

## 2023-10-16 DIAGNOSIS — Z510 Encounter for antineoplastic radiation therapy: Secondary | ICD-10-CM | POA: Diagnosis not present

## 2023-10-16 DIAGNOSIS — C801 Malignant (primary) neoplasm, unspecified: Secondary | ICD-10-CM

## 2023-10-16 DIAGNOSIS — C61 Malignant neoplasm of prostate: Secondary | ICD-10-CM | POA: Diagnosis not present

## 2023-10-16 LAB — RAD ONC ARIA SESSION SUMMARY
Course Elapsed Days: 23
Plan Fractions Treated to Date: 16
Plan Prescribed Dose Per Fraction: 1.8 Gy
Plan Total Fractions Prescribed: 25
Plan Total Prescribed Dose: 45 Gy
Reference Point Dosage Given to Date: 28.8 Gy
Reference Point Session Dosage Given: 1.8 Gy
Session Number: 16

## 2023-10-17 ENCOUNTER — Ambulatory Visit
Admission: RE | Admit: 2023-10-17 | Discharge: 2023-10-17 | Disposition: A | Discharge status: Home / Self Care | Payer: Medicare HMO | Source: Ambulatory Visit | Attending: Radiation Oncology | Admitting: Radiation Oncology

## 2023-10-17 ENCOUNTER — Other Ambulatory Visit: Payer: Self-pay

## 2023-10-17 DIAGNOSIS — C61 Malignant neoplasm of prostate: Secondary | ICD-10-CM | POA: Diagnosis not present

## 2023-10-17 DIAGNOSIS — Z510 Encounter for antineoplastic radiation therapy: Secondary | ICD-10-CM | POA: Diagnosis not present

## 2023-10-17 DIAGNOSIS — Z191 Hormone sensitive malignancy status: Secondary | ICD-10-CM | POA: Diagnosis not present

## 2023-10-17 LAB — RAD ONC ARIA SESSION SUMMARY
Course Elapsed Days: 24
Plan Fractions Treated to Date: 17
Plan Prescribed Dose Per Fraction: 1.8 Gy
Plan Total Fractions Prescribed: 25
Plan Total Prescribed Dose: 45 Gy
Reference Point Dosage Given to Date: 30.6 Gy
Reference Point Session Dosage Given: 1.8 Gy
Session Number: 17

## 2023-10-20 ENCOUNTER — Inpatient Hospital Stay: Payer: Medicare HMO

## 2023-10-20 ENCOUNTER — Other Ambulatory Visit: Payer: Self-pay

## 2023-10-20 ENCOUNTER — Ambulatory Visit
Admission: RE | Admit: 2023-10-20 | Discharge: 2023-10-20 | Discharge status: Home / Self Care | Payer: Medicare HMO | Source: Ambulatory Visit | Attending: Radiation Oncology | Admitting: Radiation Oncology

## 2023-10-20 DIAGNOSIS — C61 Malignant neoplasm of prostate: Secondary | ICD-10-CM | POA: Insufficient documentation

## 2023-10-20 DIAGNOSIS — Z191 Hormone sensitive malignancy status: Secondary | ICD-10-CM | POA: Diagnosis not present

## 2023-10-20 DIAGNOSIS — Z510 Encounter for antineoplastic radiation therapy: Secondary | ICD-10-CM | POA: Diagnosis not present

## 2023-10-20 LAB — CBC (CANCER CENTER ONLY)
HCT: 33.8 % — ABNORMAL LOW (ref 39.0–52.0)
Hemoglobin: 11.6 g/dL — ABNORMAL LOW (ref 13.0–17.0)
MCH: 30.1 pg (ref 26.0–34.0)
MCHC: 34.3 g/dL (ref 30.0–36.0)
MCV: 87.6 fL (ref 80.0–100.0)
Platelet Count: 177 10*3/uL (ref 150–400)
RBC: 3.86 MIL/uL — ABNORMAL LOW (ref 4.22–5.81)
RDW: 13 % (ref 11.5–15.5)
WBC Count: 5.8 10*3/uL (ref 4.0–10.5)
nRBC: 0 % (ref 0.0–0.2)

## 2023-10-20 LAB — RAD ONC ARIA SESSION SUMMARY
Course Elapsed Days: 27
Plan Fractions Treated to Date: 18
Plan Prescribed Dose Per Fraction: 1.8 Gy
Plan Total Fractions Prescribed: 25
Plan Total Prescribed Dose: 45 Gy
Reference Point Dosage Given to Date: 32.4 Gy
Reference Point Session Dosage Given: 1.8 Gy
Session Number: 18

## 2023-10-21 ENCOUNTER — Other Ambulatory Visit: Payer: Self-pay

## 2023-10-21 ENCOUNTER — Ambulatory Visit
Admission: RE | Admit: 2023-10-21 | Discharge: 2023-10-21 | Disposition: A | Discharge status: Home / Self Care | Payer: Medicare HMO | Source: Ambulatory Visit | Attending: Radiation Oncology | Admitting: Radiation Oncology

## 2023-10-21 DIAGNOSIS — Z510 Encounter for antineoplastic radiation therapy: Secondary | ICD-10-CM | POA: Diagnosis not present

## 2023-10-21 DIAGNOSIS — C61 Malignant neoplasm of prostate: Secondary | ICD-10-CM | POA: Diagnosis not present

## 2023-10-21 DIAGNOSIS — Z191 Hormone sensitive malignancy status: Secondary | ICD-10-CM | POA: Diagnosis not present

## 2023-10-21 LAB — RAD ONC ARIA SESSION SUMMARY
Course Elapsed Days: 28
Plan Fractions Treated to Date: 19
Plan Prescribed Dose Per Fraction: 1.8 Gy
Plan Total Fractions Prescribed: 25
Plan Total Prescribed Dose: 45 Gy
Reference Point Dosage Given to Date: 34.2 Gy
Reference Point Session Dosage Given: 1.8 Gy
Session Number: 19

## 2023-10-22 ENCOUNTER — Ambulatory Visit
Admission: RE | Admit: 2023-10-22 | Discharge: 2023-10-22 | Disposition: A | Discharge status: Home / Self Care | Payer: Medicare HMO | Source: Ambulatory Visit | Attending: Radiation Oncology | Admitting: Radiation Oncology

## 2023-10-22 ENCOUNTER — Other Ambulatory Visit: Payer: Self-pay

## 2023-10-22 DIAGNOSIS — Z191 Hormone sensitive malignancy status: Secondary | ICD-10-CM | POA: Diagnosis not present

## 2023-10-22 DIAGNOSIS — Z510 Encounter for antineoplastic radiation therapy: Secondary | ICD-10-CM | POA: Diagnosis not present

## 2023-10-22 DIAGNOSIS — C61 Malignant neoplasm of prostate: Secondary | ICD-10-CM | POA: Diagnosis not present

## 2023-10-22 LAB — RAD ONC ARIA SESSION SUMMARY
Course Elapsed Days: 29
Plan Fractions Treated to Date: 20
Plan Prescribed Dose Per Fraction: 1.8 Gy
Plan Total Fractions Prescribed: 25
Plan Total Prescribed Dose: 45 Gy
Reference Point Dosage Given to Date: 36 Gy
Reference Point Session Dosage Given: 1.8 Gy
Session Number: 20

## 2023-10-23 ENCOUNTER — Ambulatory Visit
Admission: RE | Admit: 2023-10-23 | Discharge: 2023-10-23 | Disposition: A | Discharge status: Home / Self Care | Payer: Medicare HMO | Source: Ambulatory Visit | Attending: Radiation Oncology | Admitting: Radiation Oncology

## 2023-10-23 ENCOUNTER — Other Ambulatory Visit: Payer: Self-pay

## 2023-10-23 DIAGNOSIS — Z510 Encounter for antineoplastic radiation therapy: Secondary | ICD-10-CM | POA: Diagnosis not present

## 2023-10-23 DIAGNOSIS — Z191 Hormone sensitive malignancy status: Secondary | ICD-10-CM | POA: Diagnosis not present

## 2023-10-23 DIAGNOSIS — C61 Malignant neoplasm of prostate: Secondary | ICD-10-CM | POA: Diagnosis not present

## 2023-10-23 LAB — RAD ONC ARIA SESSION SUMMARY
Course Elapsed Days: 30
Plan Fractions Treated to Date: 21
Plan Prescribed Dose Per Fraction: 1.8 Gy
Plan Total Fractions Prescribed: 25
Plan Total Prescribed Dose: 45 Gy
Reference Point Dosage Given to Date: 37.8 Gy
Reference Point Session Dosage Given: 1.8 Gy
Session Number: 21

## 2023-10-24 ENCOUNTER — Other Ambulatory Visit: Payer: Self-pay

## 2023-10-24 ENCOUNTER — Ambulatory Visit
Admission: RE | Admit: 2023-10-24 | Discharge: 2023-10-24 | Disposition: A | Discharge status: Home / Self Care | Payer: Medicare HMO | Source: Ambulatory Visit | Attending: Radiation Oncology | Admitting: Radiation Oncology

## 2023-10-24 DIAGNOSIS — Z191 Hormone sensitive malignancy status: Secondary | ICD-10-CM | POA: Diagnosis not present

## 2023-10-24 DIAGNOSIS — C61 Malignant neoplasm of prostate: Secondary | ICD-10-CM | POA: Diagnosis not present

## 2023-10-24 DIAGNOSIS — Z510 Encounter for antineoplastic radiation therapy: Secondary | ICD-10-CM | POA: Diagnosis not present

## 2023-10-24 LAB — RAD ONC ARIA SESSION SUMMARY
Course Elapsed Days: 31
Plan Fractions Treated to Date: 22
Plan Prescribed Dose Per Fraction: 1.8 Gy
Plan Total Fractions Prescribed: 25
Plan Total Prescribed Dose: 45 Gy
Reference Point Dosage Given to Date: 39.6 Gy
Reference Point Session Dosage Given: 1.8 Gy
Session Number: 22

## 2023-10-27 ENCOUNTER — Ambulatory Visit
Admission: RE | Admit: 2023-10-27 | Discharge: 2023-10-27 | Disposition: A | Discharge status: Home / Self Care | Payer: Medicare HMO | Source: Ambulatory Visit | Attending: Radiation Oncology | Admitting: Radiation Oncology

## 2023-10-27 ENCOUNTER — Other Ambulatory Visit: Payer: Self-pay

## 2023-10-27 DIAGNOSIS — Z510 Encounter for antineoplastic radiation therapy: Secondary | ICD-10-CM | POA: Diagnosis not present

## 2023-10-27 DIAGNOSIS — C61 Malignant neoplasm of prostate: Secondary | ICD-10-CM | POA: Diagnosis not present

## 2023-10-27 DIAGNOSIS — Z191 Hormone sensitive malignancy status: Secondary | ICD-10-CM | POA: Diagnosis not present

## 2023-10-27 LAB — RAD ONC ARIA SESSION SUMMARY
Course Elapsed Days: 34
Plan Fractions Treated to Date: 23
Plan Prescribed Dose Per Fraction: 1.8 Gy
Plan Total Fractions Prescribed: 25
Plan Total Prescribed Dose: 45 Gy
Reference Point Dosage Given to Date: 41.4 Gy
Reference Point Session Dosage Given: 1.8 Gy
Session Number: 23

## 2023-10-28 ENCOUNTER — Other Ambulatory Visit: Payer: Self-pay

## 2023-10-28 ENCOUNTER — Ambulatory Visit
Admission: RE | Admit: 2023-10-28 | Discharge: 2023-10-28 | Disposition: A | Discharge status: Home / Self Care | Payer: Medicare HMO | Source: Ambulatory Visit | Attending: Radiation Oncology | Admitting: Radiation Oncology

## 2023-10-28 ENCOUNTER — Encounter: Payer: Self-pay | Admitting: Anesthesiology

## 2023-10-28 ENCOUNTER — Encounter: Payer: Self-pay | Admitting: Family Medicine

## 2023-10-28 DIAGNOSIS — Z510 Encounter for antineoplastic radiation therapy: Secondary | ICD-10-CM | POA: Diagnosis not present

## 2023-10-28 DIAGNOSIS — Z191 Hormone sensitive malignancy status: Secondary | ICD-10-CM | POA: Diagnosis not present

## 2023-10-28 DIAGNOSIS — C61 Malignant neoplasm of prostate: Secondary | ICD-10-CM | POA: Diagnosis not present

## 2023-10-28 LAB — RAD ONC ARIA SESSION SUMMARY
Course Elapsed Days: 35
Plan Fractions Treated to Date: 24
Plan Prescribed Dose Per Fraction: 1.8 Gy
Plan Total Fractions Prescribed: 25
Plan Total Prescribed Dose: 45 Gy
Reference Point Dosage Given to Date: 43.2 Gy
Reference Point Session Dosage Given: 1.8 Gy
Session Number: 24

## 2023-10-29 ENCOUNTER — Ambulatory Visit: Payer: Medicare HMO

## 2023-10-29 ENCOUNTER — Ambulatory Visit: Admission: RE | Admit: 2023-10-29 | Payer: Medicare HMO | Source: Ambulatory Visit

## 2023-10-29 ENCOUNTER — Ambulatory Visit
Admission: RE | Admit: 2023-10-29 | Discharge: 2023-10-29 | Disposition: A | Discharge status: Home / Self Care | Payer: Medicare HMO | Source: Ambulatory Visit | Attending: Radiation Oncology | Admitting: Radiation Oncology

## 2023-10-29 ENCOUNTER — Encounter: Payer: Self-pay | Admitting: Anesthesiology

## 2023-10-29 VITALS — BP 148/87 | HR 80 | Temp 97.5°F | Resp 16

## 2023-10-29 DIAGNOSIS — C61 Malignant neoplasm of prostate: Secondary | ICD-10-CM

## 2023-10-29 DIAGNOSIS — Z191 Hormone sensitive malignancy status: Secondary | ICD-10-CM | POA: Diagnosis not present

## 2023-10-29 DIAGNOSIS — Z510 Encounter for antineoplastic radiation therapy: Secondary | ICD-10-CM | POA: Diagnosis not present

## 2023-10-29 SURGERY — VOLUME STUDY
Anesthesia: Choice

## 2023-10-29 NOTE — H&P (Signed)
NEW PATIENT EVALUATION  Name: Gary Benson  MRN: [id number]  Date:   10/29/2023     DOB: 08-02-1961   This 63 y.o. 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   DIAGNOSIS: There were no encounter diagnoses.   PREVIOUS INVESTIGATIONS:  Volume study performed Chart reviewed   HPI: Patient is a 63 year old male who his completing external beam radiation therapy to his prostate and pelvic nodes for stage IIIa (cT2a N0 M0) Gleason 8 (4+4) adenocarcinoma the prostate presenting with a PSA in the 30 range.  He has done quite well from with external beam treatment.  He has had some dysuria and some frequency.  His bowel function has been good.  He is seen today and taken to the OR for a volume study anticipation of an I-125 interstitial implant for boost.  PLANNED TREATMENT REGIMEN: I-125 interstitial implant for boost  PAST MEDICAL HISTORY:  has a past medical history of Cancer (HCC), CHF (congestive heart failure) (HCC), COPD (chronic obstructive pulmonary disease) (HCC), GERD (gastroesophageal reflux disease), Hypertension, Myocardial infarction (HCC), and Stroke (HCC).    PAST SURGICAL HISTORY:  Past Surgical History:  Procedure Laterality Date   CAROTID STENT     CORONARY ANGIOPLASTY WITH STENT PLACEMENT     EYE SURGERY     HERNIA REPAIR Right     FAMILY HISTORY: family history includes Diabetes in his mother and sister; Heart disease in his father.  SOCIAL HISTORY:  reports that he has been smoking cigarettes. He started smoking about 40 years ago. He has a 40.2 pack-year smoking history. He has been exposed to tobacco smoke. He has never used smokeless tobacco. He reports that he does not drink alcohol and does not  use drugs.  ALLERGIES: Bee venom  MEDICATIONS:  Current Outpatient Medications  Medication Sig Dispense Refill   acetaminophen (TYLENOL) 500 MG tablet Take 1,000 mg by mouth every morning.     aspirin 325 MG tablet Take 1 tablet (325 mg total) by mouth daily. 30 tablet 1   Blood Glucose Monitoring Suppl (TRUE METRIX AIR GLUCOSE METER) w/Device KIT USE AS DIRECTED 1 kit 3   carvedilol (COREG) 3.125 MG tablet TAKE 1 TABLET TWICE DAILY WITH MEALS 120 tablet 5   EPINEPHrine 0.3 mg/0.3 mL IJ SOAJ injection Inject 0.3 mg into the muscle as needed for anaphylaxis. 2 each 1   famotidine (PEPCID) 20 MG tablet Take 20 mg by mouth 2 (two) times daily.     gabapentin (NEURONTIN) 300 MG capsule Take 1 capsule (300 mg total) by mouth 3 (three) times daily. 90 capsule 3   Lancet Devices (TRUEDRAW LANCING DEVICE) MISC USE AS DIRECTED 1 each 3   lisinopril (ZESTRIL) 5 MG tablet TAKE 1 TABLET EVERY DAY 60 tablet 5   metFORMIN (GLUCOPHAGE-XR) 500 MG 24 hr tablet TAKE 2 TABLETS TWICE A DAY WITH MEALS 360 tablet 1   potassium chloride (KLOR-CON) 10 MEQ tablet TAKE 1 TABLET EVERY DAY 30 tablet 11   rosuvastatin (CRESTOR) 20 MG tablet Take 1 tablet (20 mg total) by mouth daily. 90 tablet 3   sertraline (ZOLOFT) 100 MG tablet TAKE 1 TABLET EVERY DAY 60 tablet 5   TRUE METRIX BLOOD GLUCOSE TEST test strip TEST BLOOD SUGAR  IN THE MORNING, AT NOON, AND AT BEDTIME 300 strip 3   TRUEplus Lancets 33G MISC TEST BLOOD SUGAR IN THE MORNING, AT NOON, AND AT BEDTIME 300 each 3   No current facility-administered medications for this encounter.    ECOG PERFORMANCE STATUS:  0 - Asymptomatic  REVIEW OF SYSTEMS: Patient denies any weight loss, fatigue, weakness, fever, chills or night sweats. Patient denies any loss of vision, blurred vision. Patient denies any ringing  of the ears or hearing loss. No irregular heartbeat. Patient denies heart murmur or history of fainting. Patient denies any chest pain or pain radiating to her  upper extremities. Patient denies any shortness of breath, difficulty breathing at night, cough or hemoptysis. Patient denies any swelling in the lower legs. Patient denies any nausea vomiting, vomiting of blood, or coffee ground material in the vomitus. Patient denies any stomach pain. Patient states has had normal bowel movements no significant constipation or diarrhea. Patient denies any dysuria, hematuria or significant nocturia. Patient denies any problems walking, swelling in the joints or loss of balance. Patient denies any skin changes, loss of hair or loss of weight. Patient denies any excessive worrying or anxiety or significant depression. Patient denies any problems with insomnia. Patient denies excessive thirst, polyuria, polydipsia. Patient denies any swollen glands, patient denies easy bruising or easy bleeding. Patient denies any recent infections, allergies or URI. Patient "s visual fields have not changed significantly in recent time.   PHYSICAL EXAM: There were no vitals taken for this visit. Well-developed well-nourished patient in NAD. HEENT reveals PERLA, EOMI, discs not visualized.  Oral cavity is clear. No oral mucosal lesions are identified. Neck is clear without evidence of cervical or supraclavicular adenopathy. Lungs are clear to A&P. Cardiac examination is essentially unremarkable with regular rate and rhythm without murmur rub or thrill. Abdomen is benign with no organomegaly or masses noted. Motor sensory and DTR levels are equal and symmetric in the upper and lower extremities. Cranial nerves II through XII are grossly intact. Proprioception is intact. No peripheral adenopathy or edema is identified. No motor or sensory levels are noted. Crude visual fields are within normal range.  LABORATORY DATA: Labs reviewed pathology reviewed    RADIOLOGY RESULTS: Volume study performed   IMPRESSION: Gleason 8 adenocarcinoma the prostate completing external beam IMRT radiation  therapy to his prostate and pelvic nodes in 63 year old male now for I-125 interstitial implant for boost  PLAN: Patient tolerated volume study well with excellent imaging is obtained.  Risks and benefits of implant including radiation safety precautions increased lower urinary tract symptoms possible diarrhea fatigue alteration of blood counts and risks of general anesthesia all were reviewed with the patient.  He comprehends my treatment plan well.  Patient is now cleared to proceed with implant.    Carmina Miller, MD

## 2023-10-29 NOTE — H&P (View-Only) (Signed)
 NEW PATIENT EVALUATION  Name: Gary Benson  MRN: [id number]  Date:   10/29/2023     DOB: 08-02-1961   This 63 y.o. 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   DIAGNOSIS: There were no encounter diagnoses.   PREVIOUS INVESTIGATIONS:  Volume study performed Chart reviewed   HPI: Patient is a 63 year old male who his completing external beam radiation therapy to his prostate and pelvic nodes for stage IIIa (cT2a N0 M0) Gleason 8 (4+4) adenocarcinoma the prostate presenting with a PSA in the 30 range.  He has done quite well from with external beam treatment.  He has had some dysuria and some frequency.  His bowel function has been good.  He is seen today and taken to the OR for a volume study anticipation of an I-125 interstitial implant for boost.  PLANNED TREATMENT REGIMEN: I-125 interstitial implant for boost  PAST MEDICAL HISTORY:  has a past medical history of Cancer (HCC), CHF (congestive heart failure) (HCC), COPD (chronic obstructive pulmonary disease) (HCC), GERD (gastroesophageal reflux disease), Hypertension, Myocardial infarction (HCC), and Stroke (HCC).    PAST SURGICAL HISTORY:  Past Surgical History:  Procedure Laterality Date   CAROTID STENT     CORONARY ANGIOPLASTY WITH STENT PLACEMENT     EYE SURGERY     HERNIA REPAIR Right     FAMILY HISTORY: family history includes Diabetes in his mother and sister; Heart disease in his father.  SOCIAL HISTORY:  reports that he has been smoking cigarettes. He started smoking about 40 years ago. He has a 40.2 pack-year smoking history. He has been exposed to tobacco smoke. He has never used smokeless tobacco. He reports that he does not drink alcohol and does not  use drugs.  ALLERGIES: Bee venom  MEDICATIONS:  Current Outpatient Medications  Medication Sig Dispense Refill   acetaminophen (TYLENOL) 500 MG tablet Take 1,000 mg by mouth every morning.     aspirin 325 MG tablet Take 1 tablet (325 mg total) by mouth daily. 30 tablet 1   Blood Glucose Monitoring Suppl (TRUE METRIX AIR GLUCOSE METER) w/Device KIT USE AS DIRECTED 1 kit 3   carvedilol (COREG) 3.125 MG tablet TAKE 1 TABLET TWICE DAILY WITH MEALS 120 tablet 5   EPINEPHrine 0.3 mg/0.3 mL IJ SOAJ injection Inject 0.3 mg into the muscle as needed for anaphylaxis. 2 each 1   famotidine (PEPCID) 20 MG tablet Take 20 mg by mouth 2 (two) times daily.     gabapentin (NEURONTIN) 300 MG capsule Take 1 capsule (300 mg total) by mouth 3 (three) times daily. 90 capsule 3   Lancet Devices (TRUEDRAW LANCING DEVICE) MISC USE AS DIRECTED 1 each 3   lisinopril (ZESTRIL) 5 MG tablet TAKE 1 TABLET EVERY DAY 60 tablet 5   metFORMIN (GLUCOPHAGE-XR) 500 MG 24 hr tablet TAKE 2 TABLETS TWICE A DAY WITH MEALS 360 tablet 1   potassium chloride (KLOR-CON) 10 MEQ tablet TAKE 1 TABLET EVERY DAY 30 tablet 11   rosuvastatin (CRESTOR) 20 MG tablet Take 1 tablet (20 mg total) by mouth daily. 90 tablet 3   sertraline (ZOLOFT) 100 MG tablet TAKE 1 TABLET EVERY DAY 60 tablet 5   TRUE METRIX BLOOD GLUCOSE TEST test strip TEST BLOOD SUGAR  IN THE MORNING, AT NOON, AND AT BEDTIME 300 strip 3   TRUEplus Lancets 33G MISC TEST BLOOD SUGAR IN THE MORNING, AT NOON, AND AT BEDTIME 300 each 3   No current facility-administered medications for this encounter.    ECOG PERFORMANCE STATUS:  0 - Asymptomatic  REVIEW OF SYSTEMS: Patient denies any weight loss, fatigue, weakness, fever, chills or night sweats. Patient denies any loss of vision, blurred vision. Patient denies any ringing  of the ears or hearing loss. No irregular heartbeat. Patient denies heart murmur or history of fainting. Patient denies any chest pain or pain radiating to her  upper extremities. Patient denies any shortness of breath, difficulty breathing at night, cough or hemoptysis. Patient denies any swelling in the lower legs. Patient denies any nausea vomiting, vomiting of blood, or coffee ground material in the vomitus. Patient denies any stomach pain. Patient states has had normal bowel movements no significant constipation or diarrhea. Patient denies any dysuria, hematuria or significant nocturia. Patient denies any problems walking, swelling in the joints or loss of balance. Patient denies any skin changes, loss of hair or loss of weight. Patient denies any excessive worrying or anxiety or significant depression. Patient denies any problems with insomnia. Patient denies excessive thirst, polyuria, polydipsia. Patient denies any swollen glands, patient denies easy bruising or easy bleeding. Patient denies any recent infections, allergies or URI. Patient "s visual fields have not changed significantly in recent time.   PHYSICAL EXAM: There were no vitals taken for this visit. Well-developed well-nourished patient in NAD. HEENT reveals PERLA, EOMI, discs not visualized.  Oral cavity is clear. No oral mucosal lesions are identified. Neck is clear without evidence of cervical or supraclavicular adenopathy. Lungs are clear to A&P. Cardiac examination is essentially unremarkable with regular rate and rhythm without murmur rub or thrill. Abdomen is benign with no organomegaly or masses noted. Motor sensory and DTR levels are equal and symmetric in the upper and lower extremities. Cranial nerves II through XII are grossly intact. Proprioception is intact. No peripheral adenopathy or edema is identified. No motor or sensory levels are noted. Crude visual fields are within normal range.  LABORATORY DATA: Labs reviewed pathology reviewed    RADIOLOGY RESULTS: Volume study performed   IMPRESSION: Gleason 8 adenocarcinoma the prostate completing external beam IMRT radiation  therapy to his prostate and pelvic nodes in 63 year old male now for I-125 interstitial implant for boost  PLAN: Patient tolerated volume study well with excellent imaging is obtained.  Risks and benefits of implant including radiation safety precautions increased lower urinary tract symptoms possible diarrhea fatigue alteration of blood counts and risks of general anesthesia all were reviewed with the patient.  He comprehends my treatment plan well.  Patient is now cleared to proceed with implant.    Carmina Miller, MD

## 2023-10-29 NOTE — Progress Notes (Signed)
Radiation Oncology Volume study note  Name: Gary Benson   Date:   09/25/2023 MRN:  [id number] DOB: June 01, 1961    This 63 y.o. male presents to the OR for volume study in anticipation of I-125 interstitial implant for Gleason 8 adenocarcinoma the prostate  REFERRING PROVIDER: No ref. provider found  HPI: Patient is a 63 year old male who his completing external beam radiation therapy to his prostate and pelvic nodes for stage IIIa (cT2a N0 M0) Gleason 8 (4+4) adenocarcinoma the prostate presenting with a PSA in the 30 range. He has done quite well from with external beam treatment. He has had some dysuria and some frequency. His bowel function has been good. He is seen today and taken to the OR for a volume study anticipation of an I-125 interstitial implant for boost. .  COMPLICATIONS OF TREATMENT: none  FOLLOW UP COMPLIANCE: keeps appointments   PHYSICAL EXAM:  There were no vitals taken for this visit. Well-developed well-nourished patient in NAD. HEENT reveals PERLA, EOMI, discs not visualized.  Oral cavity is clear. No oral mucosal lesions are identified. Neck is clear without evidence of cervical or supraclavicular adenopathy. Lungs are clear to A&P. Cardiac examination is essentially unremarkable with regular rate and rhythm without murmur rub or thrill. Abdomen is benign with no organomegaly or masses noted. Motor sensory and DTR levels are equal and symmetric in the upper and lower extremities. Cranial nerves II through XII are grossly intact. Proprioception is intact. No peripheral adenopathy or edema is identified. No motor or sensory levels are noted. Crude visual fields are within normal range.  RADIOLOGY RESULTS: Ultrasound used for volume study  PLAN: Patient was taken to the cystoscopy suite in the OR. Patient was placed in the low lithotomy position. Foley catheter was placed. Trans-rectal ultrasound probe was inserted into the rectum and prostate seminal vesicles were  visualized as well as bladder base. stepping images were performed on a 5 mm increments. Images will be placed in BrachyVision treatment planning system to determine seed placement coordinates for eventual I-125 interstitial implant. Images will be reviewed with the physics and dosimetry staff for final quality approval. I personally was present for the volume study and assisted in delineation of contour volumes.  At the end of the procedure Foley catheter was removed, rectal ultrasound probe was removed. Patient tolerated his procedures extremely well with no side effects or complaints. Patient has given appointment for interstitial implant date. Consent was signed today as well as history and physical performed in preparation for his outpatient surgical implant.     Carmina Miller, MD

## 2023-10-30 ENCOUNTER — Other Ambulatory Visit: Payer: Self-pay

## 2023-10-30 ENCOUNTER — Ambulatory Visit
Admission: RE | Admit: 2023-10-30 | Discharge: 2023-10-30 | Disposition: A | Discharge status: Home / Self Care | Payer: Medicare HMO | Source: Ambulatory Visit | Attending: Radiation Oncology | Admitting: Radiation Oncology

## 2023-10-30 DIAGNOSIS — C61 Malignant neoplasm of prostate: Secondary | ICD-10-CM | POA: Diagnosis not present

## 2023-10-30 DIAGNOSIS — Z191 Hormone sensitive malignancy status: Secondary | ICD-10-CM | POA: Diagnosis not present

## 2023-10-30 DIAGNOSIS — Z510 Encounter for antineoplastic radiation therapy: Secondary | ICD-10-CM | POA: Diagnosis not present

## 2023-10-30 LAB — RAD ONC ARIA SESSION SUMMARY
Course Elapsed Days: 37
Plan Fractions Treated to Date: 25
Plan Prescribed Dose Per Fraction: 1.8 Gy
Plan Total Fractions Prescribed: 25
Plan Total Prescribed Dose: 45 Gy
Reference Point Dosage Given to Date: 45 Gy
Reference Point Session Dosage Given: 1.8 Gy
Session Number: 25

## 2023-11-06 ENCOUNTER — Encounter: Payer: Self-pay | Admitting: Urology

## 2023-11-10 MED ORDER — OXYBUTYNIN CHLORIDE ER 10 MG PO TB24: 10.0000 mg | ORAL_TABLET | Freq: Every day | ORAL | 2 refills | Status: DC

## 2023-11-13 ENCOUNTER — Encounter: Payer: Self-pay | Admitting: Family Medicine

## 2023-11-13 DIAGNOSIS — E1165 Type 2 diabetes mellitus with hyperglycemia: Secondary | ICD-10-CM

## 2023-11-13 MED ORDER — METFORMIN HCL ER 500 MG PO TB24: 500.0000 mg | ORAL_TABLET | Freq: Two times a day (BID) | ORAL | 1 refills | Status: DC

## 2023-11-17 ENCOUNTER — Encounter
Admission: RE | Admit: 2023-11-17 | Discharge: 2023-11-17 | Disposition: A | Discharge status: Home / Self Care | Payer: Medicare HMO | Source: Ambulatory Visit | Attending: Urology | Admitting: Urology

## 2023-11-17 DIAGNOSIS — Z01818 Encounter for other preprocedural examination: Secondary | ICD-10-CM

## 2023-11-17 HISTORY — DX: Personal history of other diseases of the digestive system: Z87.19

## 2023-11-17 HISTORY — DX: Obsessive-compulsive disorder, unspecified: F42.9

## 2023-11-17 HISTORY — DX: Abnormal electrocardiogram (ECG) (EKG): R94.31

## 2023-11-17 HISTORY — DX: Anemia, unspecified: D64.9

## 2023-11-17 HISTORY — DX: Anxiety disorder, unspecified: F41.9

## 2023-11-17 HISTORY — DX: Type 2 diabetes mellitus without complications: E11.9

## 2023-11-17 HISTORY — DX: Malignant neoplasm of prostate: C61

## 2023-11-17 HISTORY — DX: Idiopathic aseptic necrosis of left femur: M87.052

## 2023-11-17 HISTORY — DX: Nicotine dependence, cigarettes, uncomplicated: F17.210

## 2023-11-17 HISTORY — DX: Idiopathic aseptic necrosis of left femur: M87.051

## 2023-11-17 HISTORY — DX: Tobacco use: Z72.0

## 2023-11-17 HISTORY — DX: Hyperlipidemia, unspecified: E78.5

## 2023-11-17 HISTORY — DX: Restless legs syndrome: G25.81

## 2023-11-17 HISTORY — DX: Elevated prostate specific antigen (PSA): R97.20

## 2023-11-17 HISTORY — DX: Psychophysiologic insomnia: F51.04

## 2023-11-17 NOTE — Patient Instructions (Signed)
 Your procedure is scheduled on:11-24-23 Monday Report to the Registration Desk on the 1st floor of the [facility].Then proceed to the 2nd floor Surgery Desk To find out your arrival time, please call [phone] between 1PM - 3PM on:11-21-23 Friday If your arrival time is 6:00 am, do not arrive before that time as the [facility] entrance doors do not open until 6:00 am.  REMEMBER: Instructions that are not followed completely may result in serious medical risk, up to and including death; or upon the discretion of your surgeon and anesthesiologist your surgery may need to be rescheduled.  Do not eat food OR drink any liquids after midnight the night before surgery.  No gum chewing or hard candies.  One week prior to surgery:Stop NOW (11-17-23)  Stop Anti-inflammatories (NSAIDS) such as Advil, Aleve, Ibuprofen, Motrin, Naproxen, Naprosyn and Aspirin  based products such as Excedrin, Goody's Powder, BC Powder. Stop ANY OVER THE COUNTER supplements until after surgery (Vitamin B12)  You may however, continue to take Tylenol  if needed for pain up until the day of surgery.  Stop metFORMIN  (GLUCOPHAGE -XR) 2 days prior to surgery-Last dose will be on 11-21-23 Friday  Continue taking all of your other prescription medications up until the day of surgery.  ON THE DAY OF SURGERY ONLY TAKE THESE MEDICATIONS WITH SIPS OF WATER : -carvedilol  (COREG )  -famotidine (PEPCID)  -gabapentin  (NEURONTIN )  -potassium chloride  (KLOR-CON )  -sertraline  (ZOLOFT )   Continue your 81 mg Aspirin  up until the day prior to surgery-Do NOT take the morning of surgery  Do Fleet Enema at home the day of surgery, 2 hours prior to arrival time to the hospital  No Alcohol for 24 hours before or after surgery.  No Smoking including e-cigarettes for 24 hours before surgery.  No chewable tobacco products for at least 6 hours before surgery.  No nicotine  patches on the day of surgery.  Do not use any "recreational" drugs  for at least a week (preferably 2 weeks) before your surgery.  Please be advised that the combination of cocaine and anesthesia may have negative outcomes, up to and including death. If you test positive for cocaine, your surgery will be cancelled.  On the morning of surgery brush your teeth with toothpaste and water , you may rinse your mouth with mouthwash if you wish. Do not swallow any toothpaste or mouthwash.  Do not wear jewelry, make-up, hairpins, clips or nail polish.  For welded (permanent) jewelry: bracelets, anklets, waist bands, etc.  Please have this removed prior to surgery.  If it is not removed, there is a chance that hospital personnel will need to cut it off on the day of surgery.  Do not wear lotions, powders, or perfumes.   Do not shave body hair from the neck down 48 hours before surgery.  Contact lenses, hearing aids and dentures may not be worn into surgery.  Do not bring valuables to the hospital. [facility] is not responsible for any missing/lost belongings or valuables.   Notify your doctor if there is any change in your medical condition (cold, fever, infection).  Wear comfortable clothing (specific to your surgery type) to the hospital.  After surgery, you can help prevent lung complications by doing breathing exercises.  Take deep breaths and cough every 1-2 hours. Your doctor may order a device called an Incentive Spirometer to help you take deep breaths. When coughing or sneezing, hold a pillow firmly against your incision with both hands. This is called "splinting." Doing this helps protect  your incision. It also decreases belly discomfort.  If you are being admitted to the hospital overnight, leave your suitcase in the car. After surgery it may be brought to your room.  In case of increased patient census, it may be necessary for you, the patient, to continue your postoperative care in the Same Day Surgery department.  If you are being discharged the  day of surgery, you will not be allowed to drive home. You will need a responsible individual to drive you home and stay with you for 24 hours after surgery.   If you are taking public transportation, you will need to have a responsible individual with you.  Please call the Pre-admissions Testing Dept. at [phone] if you have any questions about these instructions.  Surgery Visitation Policy:  Patients having surgery or a procedure may have two visitors.  Children under the age of 62 must have an adult with them who is not the patient.  Temporary Visitor Restrictions Due to increasing cases of flu, RSV and COVID-19: Children ages 38 and under will not be able to visit patients in [facility] hospitals under most circumstances.

## 2023-11-19 ENCOUNTER — Encounter
Admission: RE | Admit: 2023-11-19 | Discharge: 2023-11-19 | Disposition: A | Discharge status: Home / Self Care | Payer: Medicare HMO | Source: Ambulatory Visit | Attending: Urology | Admitting: Urology

## 2023-11-19 DIAGNOSIS — C61 Malignant neoplasm of prostate: Secondary | ICD-10-CM | POA: Insufficient documentation

## 2023-11-19 DIAGNOSIS — Z510 Encounter for antineoplastic radiation therapy: Secondary | ICD-10-CM | POA: Diagnosis not present

## 2023-11-19 DIAGNOSIS — Z01812 Encounter for preprocedural laboratory examination: Secondary | ICD-10-CM | POA: Insufficient documentation

## 2023-11-19 LAB — URINALYSIS, COMPLETE (UACMP) WITH MICROSCOPIC
Bacteria, UA: NONE SEEN
Bilirubin Urine: NEGATIVE
Glucose, UA: NEGATIVE mg/dL
Hgb urine dipstick: NEGATIVE
Ketones, ur: NEGATIVE mg/dL
Leukocytes,Ua: NEGATIVE
Nitrite: NEGATIVE
Protein, ur: NEGATIVE mg/dL
RBC / HPF: 0 RBC/hpf (ref 0–5)
Specific Gravity, Urine: 1.002 — ABNORMAL LOW (ref 1.005–1.030)
Squamous Epithelial / HPF: 0 /[HPF] (ref 0–5)
pH: 6 (ref 5.0–8.0)

## 2023-11-20 LAB — URINE CULTURE: Culture: NO GROWTH

## 2023-11-23 ENCOUNTER — Other Ambulatory Visit: Payer: Self-pay | Admitting: Family Medicine

## 2023-11-23 DIAGNOSIS — F5104 Psychophysiologic insomnia: Secondary | ICD-10-CM

## 2023-11-23 DIAGNOSIS — G2581 Restless legs syndrome: Secondary | ICD-10-CM

## 2023-11-24 ENCOUNTER — Encounter
Admission: RE | Disposition: A | Discharge status: Home / Self Care | Payer: Self-pay | Source: Ambulatory Visit | Attending: Urology

## 2023-11-24 ENCOUNTER — Ambulatory Visit
Admission: RE | Admit: 2023-11-24 | Discharge: 2023-11-24 | Disposition: A | Discharge status: Home / Self Care | Payer: Medicare HMO | Source: Ambulatory Visit | Attending: Urology | Admitting: Urology

## 2023-11-24 ENCOUNTER — Other Ambulatory Visit: Payer: Self-pay

## 2023-11-24 ENCOUNTER — Ambulatory Visit: Payer: Medicare HMO | Attending: Radiation Oncology

## 2023-11-24 ENCOUNTER — Encounter: Payer: Self-pay | Admitting: Urology

## 2023-11-24 ENCOUNTER — Ambulatory Visit: Payer: Medicare HMO

## 2023-11-24 ENCOUNTER — Ambulatory Visit: Payer: Medicare HMO | Admitting: Anesthesiology

## 2023-11-24 ENCOUNTER — Ambulatory Visit: Payer: Medicare HMO | Admitting: Urgent Care

## 2023-11-24 DIAGNOSIS — K219 Gastro-esophageal reflux disease without esophagitis: Secondary | ICD-10-CM | POA: Diagnosis not present

## 2023-11-24 DIAGNOSIS — J449 Chronic obstructive pulmonary disease, unspecified: Secondary | ICD-10-CM | POA: Diagnosis not present

## 2023-11-24 DIAGNOSIS — F17210 Nicotine dependence, cigarettes, uncomplicated: Secondary | ICD-10-CM | POA: Diagnosis not present

## 2023-11-24 DIAGNOSIS — Z955 Presence of coronary angioplasty implant and graft: Secondary | ICD-10-CM | POA: Insufficient documentation

## 2023-11-24 DIAGNOSIS — C61 Malignant neoplasm of prostate: Secondary | ICD-10-CM | POA: Insufficient documentation

## 2023-11-24 DIAGNOSIS — Z191 Hormone sensitive malignancy status: Secondary | ICD-10-CM | POA: Diagnosis not present

## 2023-11-24 DIAGNOSIS — X58XXXA Exposure to other specified factors, initial encounter: Secondary | ICD-10-CM | POA: Insufficient documentation

## 2023-11-24 DIAGNOSIS — Z8673 Personal history of transient ischemic attack (TIA), and cerebral infarction without residual deficits: Secondary | ICD-10-CM | POA: Insufficient documentation

## 2023-11-24 DIAGNOSIS — F419 Anxiety disorder, unspecified: Secondary | ICD-10-CM | POA: Diagnosis not present

## 2023-11-24 DIAGNOSIS — Z923 Personal history of irradiation: Secondary | ICD-10-CM | POA: Insufficient documentation

## 2023-11-24 DIAGNOSIS — I110 Hypertensive heart disease with heart failure: Secondary | ICD-10-CM | POA: Diagnosis not present

## 2023-11-24 DIAGNOSIS — T191XXA Foreign body in bladder, initial encounter: Secondary | ICD-10-CM | POA: Diagnosis not present

## 2023-11-24 DIAGNOSIS — I252 Old myocardial infarction: Secondary | ICD-10-CM | POA: Diagnosis not present

## 2023-11-24 DIAGNOSIS — Z01818 Encounter for other preprocedural examination: Secondary | ICD-10-CM

## 2023-11-24 DIAGNOSIS — E119 Type 2 diabetes mellitus without complications: Secondary | ICD-10-CM | POA: Diagnosis not present

## 2023-11-24 DIAGNOSIS — Z7984 Long term (current) use of oral hypoglycemic drugs: Secondary | ICD-10-CM | POA: Diagnosis not present

## 2023-11-24 DIAGNOSIS — I509 Heart failure, unspecified: Secondary | ICD-10-CM | POA: Diagnosis not present

## 2023-11-24 DIAGNOSIS — Z8546 Personal history of malignant neoplasm of prostate: Secondary | ICD-10-CM | POA: Diagnosis not present

## 2023-11-24 HISTORY — PX: RADIOACTIVE SEED IMPLANT: SHX5150

## 2023-11-24 LAB — GLUCOSE, CAPILLARY
Glucose-Capillary: 173 mg/dL — ABNORMAL HIGH (ref 70–99)
Glucose-Capillary: 136 mg/dL — ABNORMAL HIGH (ref 70–99)

## 2023-11-24 SURGERY — INSERTION, RADIATION SOURCE, PROSTATE
Anesthesia: General | Site: Prostate

## 2023-11-24 MED ORDER — DEXAMETHASONE SODIUM PHOSPHATE 10 MG/ML IJ SOLN
INTRAMUSCULAR | Status: DC | PRN
  Administered 2023-11-24: 5 mg via INTRAVENOUS

## 2023-11-24 MED ORDER — OXYCODONE HCL 5 MG PO TABS: 5.0000 mg | ORAL_TABLET | Freq: Once | ORAL | Status: DC | PRN

## 2023-11-24 MED ORDER — FLEET ENEMA RE ENEM: 1.0000 | ENEMA | Freq: Once | RECTAL | Status: DC

## 2023-11-24 MED ORDER — CEFAZOLIN SODIUM-DEXTROSE 2-4 GM/100ML-% IV SOLN
INTRAVENOUS | Status: AC
  Filled 2023-11-24: qty 100

## 2023-11-24 MED ORDER — ONDANSETRON HCL 4 MG/2ML IJ SOLN
INTRAMUSCULAR | Status: DC | PRN
  Administered 2023-11-24: 4 mg via INTRAVENOUS

## 2023-11-24 MED ORDER — ONDANSETRON HCL 4 MG/2ML IJ SOLN: 4.0000 mg | Freq: Once | INTRAMUSCULAR | Status: DC | PRN

## 2023-11-24 MED ORDER — EPHEDRINE 5 MG/ML INJ
INTRAVENOUS | Status: AC
  Filled 2023-11-24: qty 5

## 2023-11-24 MED ORDER — STERILE WATER FOR IRRIGATION IR SOLN
Status: DC | PRN
  Administered 2023-11-24: 500 mL

## 2023-11-24 MED ORDER — BACITRACIN ZINC 500 UNIT/GM EX OINT
TOPICAL_OINTMENT | CUTANEOUS | Status: AC
  Filled 2023-11-24: qty 28.35

## 2023-11-24 MED ORDER — ONDANSETRON HCL 4 MG/2ML IJ SOLN
INTRAMUSCULAR | Status: AC
  Filled 2023-11-24: qty 2

## 2023-11-24 MED ORDER — HYDROCODONE-ACETAMINOPHEN 5-325 MG PO TABS: 1.0000 | ORAL_TABLET | Freq: Four times a day (QID) | ORAL | 0 refills | Status: DC | PRN

## 2023-11-24 MED ORDER — FENTANYL CITRATE (PF) 100 MCG/2ML IJ SOLN
INTRAMUSCULAR | Status: AC
  Filled 2023-11-24: qty 2

## 2023-11-24 MED ORDER — DEXAMETHASONE SODIUM PHOSPHATE 10 MG/ML IJ SOLN
INTRAMUSCULAR | Status: AC
Start: 2023-11-24 — End: ?
  Filled 2023-11-24: qty 1

## 2023-11-24 MED ORDER — LIDOCAINE HCL (PF) 2 % IJ SOLN
INTRAMUSCULAR | Status: AC
  Filled 2023-11-24: qty 5

## 2023-11-24 MED ORDER — MIDAZOLAM HCL 2 MG/2ML IJ SOLN
INTRAMUSCULAR | Status: AC
  Filled 2023-11-24: qty 2

## 2023-11-24 MED ORDER — CEFAZOLIN SODIUM-DEXTROSE 2-4 GM/100ML-% IV SOLN
2.0000 g | Freq: Three times a day (TID) | INTRAVENOUS | Status: DC
  Administered 2023-11-24: 2 g via INTRAVENOUS

## 2023-11-24 MED ORDER — FENTANYL CITRATE (PF) 100 MCG/2ML IJ SOLN: 25.0000 ug | INTRAMUSCULAR | Status: DC | PRN

## 2023-11-24 MED ORDER — CHLORHEXIDINE GLUCONATE 0.12 % MT SOLN
15.0000 mL | Freq: Once | OROMUCOSAL | Status: AC
  Administered 2023-11-24: 15 mL via OROMUCOSAL

## 2023-11-24 MED ORDER — MIDAZOLAM HCL 2 MG/2ML IJ SOLN
INTRAMUSCULAR | Status: DC | PRN
  Administered 2023-11-24: 1 mg via INTRAVENOUS

## 2023-11-24 MED ORDER — ROCURONIUM BROMIDE 100 MG/10ML IV SOLN
INTRAVENOUS | Status: DC | PRN
  Administered 2023-11-24: 40 mg via INTRAVENOUS

## 2023-11-24 MED ORDER — EPHEDRINE SULFATE-NACL 50-0.9 MG/10ML-% IV SOSY
PREFILLED_SYRINGE | INTRAVENOUS | Status: DC | PRN
  Administered 2023-11-24: 5 mg via INTRAVENOUS

## 2023-11-24 MED ORDER — ORAL CARE MOUTH RINSE: 15.0000 mL | Freq: Once | OROMUCOSAL | Status: AC

## 2023-11-24 MED ORDER — SODIUM CHLORIDE 0.9 % IR SOLN
Status: DC | PRN
  Administered 2023-11-24: 1000 mL

## 2023-11-24 MED ORDER — FENTANYL CITRATE (PF) 100 MCG/2ML IJ SOLN
INTRAMUSCULAR | Status: DC | PRN
  Administered 2023-11-24: 25 ug via INTRAVENOUS
  Administered 2023-11-24: 50 ug via INTRAVENOUS
  Administered 2023-11-24: 25 ug via INTRAVENOUS

## 2023-11-24 MED ORDER — PHENYLEPHRINE HCL-NACL 20-0.9 MG/250ML-% IV SOLN
INTRAVENOUS | Status: AC
Start: 2023-11-24 — End: ?
  Filled 2023-11-24: qty 250

## 2023-11-24 MED ORDER — PROPOFOL 10 MG/ML IV BOLUS
INTRAVENOUS | Status: DC | PRN
  Administered 2023-11-24: 140 mg via INTRAVENOUS

## 2023-11-24 MED ORDER — ACETAMINOPHEN 10 MG/ML IV SOLN: 1000.0000 mg | Freq: Once | INTRAVENOUS | Status: DC | PRN

## 2023-11-24 MED ORDER — SUGAMMADEX SODIUM 200 MG/2ML IV SOLN
INTRAVENOUS | Status: DC | PRN
  Administered 2023-11-24: 300 mg via INTRAVENOUS

## 2023-11-24 MED ORDER — CHLORHEXIDINE GLUCONATE 0.12 % MT SOLN
OROMUCOSAL | Status: AC
  Filled 2023-11-24: qty 15

## 2023-11-24 MED ORDER — SODIUM CHLORIDE 0.9 % IV SOLN
INTRAVENOUS | Status: DC
Start: 2023-11-24 — End: 2023-11-24

## 2023-11-24 MED ORDER — OXYCODONE HCL 5 MG/5ML PO SOLN: 5.0000 mg | Freq: Once | ORAL | Status: DC | PRN

## 2023-11-24 MED ORDER — KETOROLAC TROMETHAMINE 30 MG/ML IJ SOLN
INTRAMUSCULAR | Status: AC
  Filled 2023-11-24: qty 1

## 2023-11-24 MED ORDER — PROPOFOL 10 MG/ML IV BOLUS
INTRAVENOUS | Status: AC
  Filled 2023-11-24: qty 40

## 2023-11-24 MED ORDER — TAMSULOSIN HCL 0.4 MG PO CAPS: 0.4000 mg | ORAL_CAPSULE | Freq: Every day | ORAL | 0 refills | Status: DC

## 2023-11-24 MED ORDER — LIDOCAINE HCL (CARDIAC) PF 100 MG/5ML IV SOSY
PREFILLED_SYRINGE | INTRAVENOUS | Status: DC | PRN
  Administered 2023-11-24: 80 mg via INTRAVENOUS

## 2023-11-24 MED ORDER — ROCURONIUM BROMIDE 10 MG/ML (PF) SYRINGE
PREFILLED_SYRINGE | INTRAVENOUS | Status: AC
  Filled 2023-11-24: qty 10

## 2023-11-24 SURGICAL SUPPLY — 20 items
BAG URINE DRAIN 2000ML AR STRL (UROLOGICAL SUPPLIES) ×1 IMPLANT
BLADE CLIPPER SURG (BLADE) ×1 IMPLANT
CATH FOL 2WAY LX 16X5 (CATHETERS) ×1 IMPLANT
DRAPE INCISE 23X17 STRL (DRAPES) ×1 IMPLANT
DRAPE INCISE IOBAN 23X17 STRL (DRAPES) ×1
DRAPE TABLE BACK 80X90 (DRAPES) ×1 IMPLANT
DRAPE UNDER BUTTOCK W/FLU (DRAPES) ×1 IMPLANT
DRSG TELFA 3X8 NADH STRL (GAUZE/BANDAGES/DRESSINGS) ×1 IMPLANT
GLOVE BIO SURGEON STRL SZ 6.5 (GLOVE) ×2 IMPLANT
GLOVE BIO SURGEON STRL SZ7.5 (GLOVE) ×2 IMPLANT
GOWN STRL REUS W/ TWL LRG LVL3 (GOWN DISPOSABLE) ×2 IMPLANT
GOWN STRL REUS W/ TWL XL LVL3 (GOWN DISPOSABLE) ×1 IMPLANT
IV NS 1000ML BAXH (IV SOLUTION) ×1 IMPLANT
KIT TURNOVER CYSTO (KITS) ×1 IMPLANT
PACK CYSTO AR (MISCELLANEOUS) ×1 IMPLANT
SET CYSTO W/LG BORE CLAMP LF (SET/KITS/TRAYS/PACK) ×1 IMPLANT
SURGILUBE 2OZ TUBE FLIPTOP (MISCELLANEOUS) ×1 IMPLANT
SYR 10ML LL (SYRINGE) ×1 IMPLANT
WATER STERILE IRR 1000ML POUR (IV SOLUTION) ×1 IMPLANT
WATER STERILE IRR 500ML POUR (IV SOLUTION) ×1 IMPLANT

## 2023-11-24 NOTE — Interval H&P Note (Signed)
History and Physical Interval Note:  11/24/2023 7:00 AM  Gary Benson  has presented today for surgery, with the diagnosis of Prostate Cancer.  The various methods of treatment have been discussed with the patient and family. After consideration of risks, benefits and other options for treatment, the patient has consented to  Procedure(s): RADIOACTIVE SEED IMPLANT/BRACHYTHERAPY IMPLANT (N/A) as a surgical intervention.  The patient's history has been reviewed, patient examined, no change in status, stable for surgery.  I have reviewed the patient's chart and labs.  Questions were answered to the patient's satisfaction.    RRR CTAB   [name]

## 2023-11-24 NOTE — Transfer of Care (Signed)
Immediate Anesthesia Transfer of Care Note  Patient: Gary Benson  Procedure(s) Performed: RADIOACTIVE SEED IMPLANT/BRACHYTHERAPY IMPLANT (76 seeds) (Prostate)  Patient Location: PACU  Anesthesia Type:General  Level of Consciousness: awake  Airway & Oxygen Therapy: Patient Spontanous Breathing and Patient connected to face mask oxygen  Post-op Assessment: Report given to RN and Post -op Vital signs reviewed and stable  Post vital signs: Reviewed and stable  Last Vitals:  Vitals Value Taken Time  BP 172/94 11/24/23 0838  Temp    Pulse 93 11/24/23 0840  Resp 18 11/24/23 0840  SpO2 100 % 11/24/23 0840  Vitals shown include unfiled device data.  Last Pain:  Vitals:   11/24/23 0631  TempSrc: Oral  PainSc: 0-No pain         Complications: No notable events documented.

## 2023-11-24 NOTE — Progress Notes (Signed)
Radiation Oncology I-125 interstitial implant note Name: Gary Benson   Date:   09/25/2023 MRN:  [id number] DOB: 1960-10-22    This 63 y.o. male presents to the OR for I-125 interstitial implant for boost and patient with known stage IIIa Gleason 8 (4+4) adenocarcinoma the prostate REFERRING PROVIDER: No ref. provider found  HPI: Patient is a 63 year old male who his completing external beam radiation therapy to his prostate and pelvic nodes for stage IIIa (cT2a N0 M0) Gleason 8 (4+4) adenocarcinoma the prostate presenting with a PSA in the 30 range. He has done quite well from with external beam treatment. He has had some dysuria and some frequency. His bowel function has been good. Marland Kitchen  He was brought to the OR today for I-125 interstitial implant for boost.  COMPLICATIONS OF TREATMENT: none  FOLLOW UP COMPLIANCE: keeps appointments   PHYSICAL EXAM:  BP 135/82   Pulse 87   Temp 97.8 F (36.6 C) (Temporal)   Resp 19   Ht 5\' 9"  (1.753 m)   Wt 155 lb (70.3 kg)   SpO2 99%   BMI 22.89 kg/m  Well-developed well-nourished patient in NAD. HEENT reveals PERLA, EOMI, discs not visualized.  Oral cavity is clear. No oral mucosal lesions are identified. Neck is clear without evidence of cervical or supraclavicular adenopathy. Lungs are clear to A&P. Cardiac examination is essentially unremarkable with regular rate and rhythm without murmur rub or thrill. Abdomen is benign with no organomegaly or masses noted. Motor sensory and DTR levels are equal and symmetric in the upper and lower extremities. Cranial nerves II through XII are grossly intact. Proprioception is intact. No peripheral adenopathy or edema is identified. No motor or sensory levels are noted. Crude visual fields are within normal range.  RADIOLOGY RESULTS: Ultrasound used for source placement  PLAN: Patient was taken to the operating room and general anesthesia was administered. Legs were immobilized in stirrups and patient was  positioned in the exact same proportions as original volume study. Patient was prepped and Foley catheter was placed. Ultrasound guidance identified the prostate and recreated the original set up as per treatment planning volume study.  A needle grid was attached to the ultrasound probe to position the needles.  26needles were placed under ultrasound guidance  to the  prostate PTV  . After completion of procedure cystoscopy was performed by urology and no evidence of seeds in the bladder were noted. Patient tolerated the procedure extremely well. Initial plain film as doublecheck identified 76 seeds in the prostate. Patient has followup appointment in one month for CT scan for quality assurance will be performed.Prior to implant 10% or 10 loose seeds which is ever higher were ordered and assayed to verify source strength.     Carmina Miller, MD

## 2023-11-24 NOTE — Anesthesia Postprocedure Evaluation (Signed)
Anesthesia Post Note  Patient: Gary Benson  Procedure(s) Performed: RADIOACTIVE SEED IMPLANT/BRACHYTHERAPY IMPLANT (76 seeds) (Prostate)  Patient location during evaluation: PACU Anesthesia Type: General Level of consciousness: awake and alert Pain management: pain level controlled Vital Signs Assessment: post-procedure vital signs reviewed and stable Respiratory status: spontaneous breathing, nonlabored ventilation, respiratory function stable and patient connected to nasal cannula oxygen Cardiovascular status: blood pressure returned to baseline and stable Postop Assessment: no apparent nausea or vomiting Anesthetic complications: no   No notable events documented.   Last Vitals:  Vitals:   11/24/23 0908 11/24/23 0917  BP: (!) 148/83 135/82  Pulse: 87   Resp: 19   Temp: (!) 36.4 C 36.6 C  SpO2: 98% 99%    Last Pain:  Vitals:   11/24/23 0917  TempSrc: Temporal  PainSc: 0-No pain                 Corinda Gubler

## 2023-11-24 NOTE — Op Note (Signed)
11/24/23  Preoperative diagnosis: Adenocarcinoma of the prostate   Postoperative diagnosis: Same   Procedure: I-125 prostate seed implantation, cystoscopy, removal of bladder foreign body  Surgeon: [name] M.D. ,   Radiation oncologist:: Marena Chancy, M.D.   Anesthesia: General  Drains: none  Complications: none  Indications: 63 yo male with high risk prostate cancer who presents today for brachytherapy seed placement in the form of brachytherapy boost.  Procedure: Patient was brought to operating suite and placement table in the supine position. At this time, a universal timeout protocol was performed, all team members were identified, Venodyne boots are placed, and he was administered IV Ancef in the preoperative period. He was placed in lithotomy position and prepped and draped in usual manner. Radiation oncology department placed a transrectal ultrasound probe anchoring stand/ grid and aligned with previous imaging from the volume study. Foley catheter was inserted without difficulty.  All needle passage was done with real-time transrectal ultrasound guidance in both the transverse and sagittal plains in order to achieve the desired preplanned position. A total of 80 needles were placed.  76 active seeds were implanted. The Foley catheter was removed and a rigid cystoscopy showed seeds in the bladder at the 12 o'clock position.  Graspers were then used to extract the seeds.  There appeared to be 4 seeds together that were connected.  This was successfully removed.  There was no other traumas, lesions, or exposed seeds..  The bladder was drained.  A fluoroscopic image was then obtained showing excellent distrubution of the brachytherapy seeds.  Each seed was counted and counts were correct.    The patient was then repositioned in the supine position, reversed from anesthesia, and taken to the PACU in stable condition.  [name], MD

## 2023-11-24 NOTE — Anesthesia Preprocedure Evaluation (Signed)
Anesthesia Evaluation  Patient identified by MRN, date of birth, ID band Patient awake    Reviewed: Allergy & Precautions, NPO status , Patient's Chart, lab work & pertinent test results  History of Anesthesia Complications Negative for: history of anesthetic complications  Airway Mallampati: II  TM Distance: >3 FB Neck ROM: Full    Dental  (+) Poor Dentition, Missing   Pulmonary neg sleep apnea, COPD, Current Smoker and Patient abstained from smoking.   Pulmonary exam normal breath sounds clear to auscultation       Cardiovascular Exercise Tolerance: Good METS: 3 - Mets hypertension, + Past MI, + Cardiac Stents and +CHF  (-) CAD (-) dysrhythmias  Rhythm:Regular Rate:Normal - Systolic murmurs    Neuro/Psych  PSYCHIATRIC DISORDERS Anxiety     Residual left sided weakness CVA, Residual Symptoms    GI/Hepatic ,GERD  ,,(+)     (-) substance abuse    Endo/Other  diabetes, Oral Hypoglycemic Agents    Renal/GU negative Renal ROS     Musculoskeletal   Abdominal   Peds  Hematology   Anesthesia Other Findings Past Medical History: No date: Abnormal EKG No date: Anemia No date: Anxiety No date: Avascular necrosis of bones of both hips (HCC) No date: CHF (congestive heart failure) (HCC) No date: Cigarette smoker No date: COPD (chronic obstructive pulmonary disease) (HCC) No date: DM (diabetes mellitus), type 2 (HCC) No date: Elevated PSA No date: GERD (gastroesophageal reflux disease) No date: History of right inguinal hernia No date: Hyperlipidemia No date: Hypertension No date: Myocardial infarction (HCC) No date: 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 No date: Prostate cancer (HCC) No date: Psychophysiologic insomnia No date: RLS (restless legs syndrome) 2015: Stroke (HCC) No date: Tobacco use  Reproductive/Obstetrics                             Anesthesia  Physical Anesthesia Plan  ASA: 3  Anesthesia Plan: General   Post-op Pain Management: Ofirmev IV (intra-op)*   Induction: Intravenous  PONV Risk Score and Plan: 2 and Ondansetron, Dexamethasone, Treatment may vary due to age or medical condition and Midazolam  Airway Management Planned: Oral ETT and Video Laryngoscope Planned  Additional Equipment: None  Intra-op Plan:   Post-operative Plan: Extubation in OR  Informed Consent: I have reviewed the patients History and Physical, chart, labs and discussed the procedure including the risks, benefits and alternatives for the proposed anesthesia with the patient or authorized representative who has indicated his/her understanding and acceptance.     Dental advisory given  Plan Discussed with: CRNA and Surgeon  Anesthesia Plan Comments: (Discussed risks of anesthesia with patient, including PONV, sore throat, lip/dental/eye damage. Rare risks discussed as well, such as cardiorespiratory and neurological sequelae, and allergic reactions. Discussed the role of CRNA in patient's perioperative care. Patient understands. Patient counseled on benefits of smoking cessation, and increased perioperative risks associated with continued smoking. )       Anesthesia Quick Evaluation

## 2023-11-24 NOTE — Telephone Encounter (Signed)
Requested medications are due for refill today.  yes  Requested medications are on the active medications list.  yes  Last refill. 07/21/2023 #90 3 rf  Future visit scheduled.   yes  Notes to clinic.  PT reports taking the med differently than prescribed. Please review for refill.    Requested Prescriptions  Pending Prescriptions Disp Refills   gabapentin (NEURONTIN) 300 MG capsule [Pharmacy Med Name: Gabapentin Oral Capsule 300 MG] 90 capsule 11    Sig: TAKE 1 CAPSULE THREE TIMES DAILY     Neurology: Anticonvulsants - gabapentin Passed - 11/24/2023  4:35 PM      Passed - Cr in normal range and within 360 days    Creatinine  Date Value Ref Range Status  10/09/2014 1.04 0.60 - 1.30 mg/dL Final   Creatinine, Ser  Date Value Ref Range Status  08/28/2023 0.88 0.76 - 1.27 mg/dL Final         Passed - Completed PHQ-2 or PHQ-9 in the last 360 days      Passed - Valid encounter within last 12 months    Recent Outpatient Visits           3 months ago Type 2 diabetes mellitus with hyperglycemia, without long-term current use of insulin (HCC)   [facility] [place], Monico Blitz, DO   3 months ago Encounter for initial annual wellness visit (AWV) in Medicare patient   [facility] Pard[place], Monico Blitz, DO   4 months ago    [facility] Pardue, Monico Blitz, DO   6 months ago Type 2 diabetes mellitus with hyperglycemia, without long-term current use of insulin [facility])   [facility] Pardue, Monico Blitz, DO   7 months ago Type 2 diabetes mellitus with hyperglycemia, without long-term current use of insulin [facility])   [facility] Pardue, Monico Blitz, DO       Future Appointments             In 1 week Pardue, Monico Blitz, DO [facility]   In 1 month Agbor-Etang, Arlys John, MD [facility] [place]   In 4 months [name], MD [facility]  [facility]

## 2023-11-24 NOTE — Anesthesia Procedure Notes (Signed)
Procedure Name: Intubation Date/Time: 11/24/2023 7:52 AM  Performed by: Elisabeth Pigeon, CRNAPre-anesthesia Checklist: Patient identified, Patient being monitored, Timeout performed, Emergency Drugs available and Suction available Patient Re-evaluated:Patient Re-evaluated prior to induction Oxygen Delivery Method: Circle system utilized Preoxygenation: Pre-oxygenation with 100% oxygen Induction Type: IV induction Ventilation: Mask ventilation without difficulty Laryngoscope Size: Mac, 4 and McGrath Grade View: Grade I Tube type: Oral Tube size: 7.0 mm Number of attempts: 1 Airway Equipment and Method: Stylet Placement Confirmation: ETT inserted through vocal cords under direct vision, positive ETCO2 and breath sounds checked- equal and bilateral Secured at: 23 cm Tube secured with: Tape Dental Injury: Teeth and Oropharynx as per pre-operative assessment

## 2023-11-25 ENCOUNTER — Encounter: Payer: Self-pay | Admitting: Urology

## 2023-11-25 ENCOUNTER — Ambulatory Visit: Payer: Medicare HMO | Admitting: Family Medicine

## 2023-11-26 ENCOUNTER — Ambulatory Visit: Payer: Medicare HMO | Admitting: Radiation Oncology

## 2023-11-28 ENCOUNTER — Other Ambulatory Visit: Payer: Self-pay | Admitting: Physician Assistant

## 2023-11-28 ENCOUNTER — Other Ambulatory Visit: Payer: Self-pay | Admitting: Family Medicine

## 2023-11-28 DIAGNOSIS — F5104 Psychophysiologic insomnia: Secondary | ICD-10-CM

## 2023-11-28 DIAGNOSIS — G2581 Restless legs syndrome: Secondary | ICD-10-CM

## 2023-11-28 DIAGNOSIS — E1169 Type 2 diabetes mellitus with other specified complication: Secondary | ICD-10-CM

## 2023-11-28 NOTE — Telephone Encounter (Signed)
Copied from CRM [id number]. Topic: Clinical - Medication Refill >> Nov 28, 2023 11:07 AM Antony Haste wrote: Most Recent Primary Care Visit:  Provider: Sherlyn Hay  Department: [id number]  Visit Type: OFFICE VISIT  Date: 08/25/2023  Medication: gabapentin (NEURONTIN) 300 MG capsule  Has the patient contacted their pharmacy? No (Agent: If no, request that the patient contact the pharmacy for the refill. If patient does not wish to contact the pharmacy document the reason why and proceed with request.) (Agent: If yes, when and what did the pharmacy advise?)  Is this the correct pharmacy for this prescription? Yes If no, delete pharmacy and type the correct one.  This is the patient's preferred pharmacy:   [facility] - [place], [place] - [address] [place] [address] Phone: [phone] Fax: [phone]  Has the prescription been filled recently? No  Is the patient out of the medication? Yes  Has the patient been seen for an appointment in the last year OR does the patient have an upcoming appointment? Yes  Can we respond through MyChart? No, callback preferred.  Agent: Please be advised that Rx refills may take up to 3 business days. We ask that you follow-up with your pharmacy.

## 2023-11-30 NOTE — Progress Notes (Signed)
 Note was opened for precharting.  Patient did not come to appointment and thus was not seen or evaluated by physician.

## 2023-12-04 ENCOUNTER — Encounter: Payer: Self-pay | Admitting: Family Medicine

## 2023-12-05 ENCOUNTER — Ambulatory Visit: Payer: Self-pay | Admitting: Family Medicine

## 2023-12-05 ENCOUNTER — Encounter: Payer: Self-pay | Admitting: Family Medicine

## 2023-12-05 VITALS — BP 119/79 | HR 81

## 2023-12-05 DIAGNOSIS — E785 Hyperlipidemia, unspecified: Secondary | ICD-10-CM | POA: Diagnosis not present

## 2023-12-05 DIAGNOSIS — Z23 Encounter for immunization: Secondary | ICD-10-CM

## 2023-12-05 DIAGNOSIS — F17200 Nicotine dependence, unspecified, uncomplicated: Secondary | ICD-10-CM

## 2023-12-05 DIAGNOSIS — E1165 Type 2 diabetes mellitus with hyperglycemia: Secondary | ICD-10-CM

## 2023-12-05 DIAGNOSIS — G8929 Other chronic pain: Secondary | ICD-10-CM

## 2023-12-05 DIAGNOSIS — M87051 Idiopathic aseptic necrosis of right femur: Secondary | ICD-10-CM

## 2023-12-05 DIAGNOSIS — Z7984 Long term (current) use of oral hypoglycemic drugs: Secondary | ICD-10-CM

## 2023-12-05 DIAGNOSIS — I10 Essential (primary) hypertension: Secondary | ICD-10-CM

## 2023-12-05 DIAGNOSIS — E1169 Type 2 diabetes mellitus with other specified complication: Secondary | ICD-10-CM

## 2023-12-05 DIAGNOSIS — Z5320 Procedure and treatment not carried out because of patient's decision for unspecified reasons: Secondary | ICD-10-CM

## 2023-12-05 DIAGNOSIS — C61 Malignant neoplasm of prostate: Secondary | ICD-10-CM | POA: Diagnosis not present

## 2023-12-05 LAB — POCT GLYCOSYLATED HEMOGLOBIN (HGB A1C): Hemoglobin A1C: 7.4 % — AB (ref 4.0–5.6)

## 2023-12-05 NOTE — Progress Notes (Signed)
 Established patient visit   Patient: Gary Benson   DOB: [birth year]   63 y.o. Male  MRN: [id number] Visit Date: 12/05/2023  Today's healthcare provider: Sherlyn Hay, DO   Chief Complaint  Patient presents with   Medical Management of Chronic Issues   Diabetes   Hypertension   Subjective    HPI Last annual/mAWV: 07/28/2023 Eye exam - May 2025 A1c 7.4 today   Gary Benson "Gary Benson" is a 63 year old male with prostate cancer who presents for follow-up after a recent prostate procedure and ongoing management of multiple chronic conditions.  He recently underwent a prostate implant procedure on September 23, 2023, and is doing well post-procedure. He is currently taking oxybutynin 10 mg to manage urinary frequency, which provides some relief but is not completely effective. He also takes tamsulosin to aid with urinary symptoms.  He manages diabetes with metformin, taking two tablets daily, one in the morning and one in the evening. Blood sugar levels in the morning range from 129 to 132 mg/dL and in the evening from high 90s to 119 mg/dL. He previously experienced low blood sugar levels and diarrhea on higher doses of metformin, which have since resolved. His A1c was 7.4% at the last check, up from 6.8% previously, attributed to recent radiation treatments completed in late January 2025 after starting in December 2024. He underwent 25 radiation treatments in total.  He takes gabapentin twice daily for pain management, which helps with sleep and reduces leg jerking at night. He also takes rosuvastatin for cholesterol, which he takes in the morning along with his other medications, and carvedilol twice daily for hypertension.  He received a COVID-19 vaccine and a shingles vaccine on September 05, 2023. He is due for a follow-up shingles vaccine soon.  He has a history of smoking and currently has no interest in quitting.     Medications: Outpatient Medications Prior  to Visit  Medication Sig   acetaminophen (TYLENOL) 500 MG tablet Take 1,000 mg by mouth every morning.   aspirin EC 81 MG tablet Take 81 mg by mouth daily. Swallow whole.   Blood Glucose Monitoring Suppl (TRUE METRIX AIR GLUCOSE METER) w/Device KIT USE AS DIRECTED   carvedilol (COREG) 3.125 MG tablet TAKE 1 TABLET TWICE DAILY WITH MEALS   cyanocobalamin (VITAMIN B12) 1000 MCG tablet Take 1,000 mcg by mouth daily.   EPINEPHrine 0.3 mg/0.3 mL IJ SOAJ injection Inject 0.3 mg into the muscle as needed for anaphylaxis.   famotidine (PEPCID) 20 MG tablet Take 20 mg by mouth every morning.   gabapentin (NEURONTIN) 300 MG capsule TAKE 1 CAPSULE THREE TIMES DAILY   HYDROcodone-acetaminophen (NORCO/VICODIN) 5-325 MG tablet Take 1-2 tablets by mouth every 6 (six) hours as needed for moderate pain (pain score 4-6).   Lancet Devices (TRUEDRAW LANCING DEVICE) MISC USE AS DIRECTED   lisinopril (ZESTRIL) 5 MG tablet TAKE 1 TABLET EVERY DAY (Patient taking differently: Take 5 mg by mouth every morning.)   metFORMIN (GLUCOPHAGE-XR) 500 MG 24 hr tablet Take 1 tablet (500 mg total) by mouth 2 (two) times daily with a meal.   oxybutynin (DITROPAN-XL) 10 MG 24 hr tablet Take 1 tablet (10 mg total) by mouth daily. (Patient taking differently: Take 10 mg by mouth every morning.)   potassium chloride (KLOR-CON) 10 MEQ tablet TAKE 1 TABLET EVERY DAY (Patient taking differently: Take 10 mEq by mouth every morning.)   rosuvastatin (CRESTOR) 20 MG tablet TAKE 1 TABLET  EVERY DAY   sertraline (ZOLOFT) 100 MG tablet TAKE 1 TABLET EVERY DAY (Patient taking differently: Take 100 mg by mouth every morning.)   tamsulosin (FLOMAX) 0.4 MG CAPS capsule Take 1 capsule (0.4 mg total) by mouth daily.   TRUE METRIX BLOOD GLUCOSE TEST test strip TEST BLOOD SUGAR IN THE MORNING, AT NOON, AND AT BEDTIME   TRUEplus Lancets 33G MISC TEST BLOOD SUGAR IN THE MORNING, AT NOON, AND AT BEDTIME   No facility-administered medications prior to  visit.        Objective    BP 119/79 (BP Location: Right Arm, Patient Position: Sitting, Cuff Size: Normal)   Pulse 81   SpO2 99%     Physical Exam Vitals and nursing note reviewed.  Constitutional:      General: He is not in acute distress.    Appearance: Normal appearance.  HENT:     Head: Normocephalic and atraumatic.  Eyes:     General: No scleral icterus.    Conjunctiva/sclera: Conjunctivae normal.  Cardiovascular:     Rate and Rhythm: Normal rate.  Pulmonary:     Effort: Pulmonary effort is normal.  Neurological:     Mental Status: He is alert and oriented to person, place, and time. Mental status is at baseline.  Psychiatric:        Mood and Affect: Mood normal.        Behavior: Behavior normal.      Results for orders placed or performed in visit on 12/05/23  Microalbumin / creatinine urine ratio  Result Value Ref Range   Creatinine, Urine 12.5 Not Estab. mg/dL   Microalbumin, Urine <4.0 Not Estab. ug/mL   Microalb/Creat Ratio <24 0 - 29 mg/g creat  Specimen status report  Result Value Ref Range   specimen status report Comment   POCT HgB A1C  Result Value Ref Range   Hemoglobin A1C 7.4 (A) 4.0 - 5.6 %   HbA1c POC (<> result, manual entry)     HbA1c, POC (prediabetic range)     HbA1c, POC (controlled diabetic range)      Assessment & Plan    Type 2 diabetes mellitus with hyperglycemia, without long-term current use of insulin (HCC) Assessment & Plan: Current A1c is 7.4, previously 6.8. Reports blood glucose levels ranging from 129-132 mg/dL in the morning and 98-119 mg/dL in the evening. Currently on metformin 500 mg twice daily. Previous higher doses caused diarrhea. Discussed potential impact of recent radiation therapy on blood glucose levels. Consider adding additional medication if A1c remains elevated. Emphasized the importance of maintaining blood glucose levels in the target range to prevent complications. - Continue metformin 500 mg twice  daily - Recheck A1c in three months - Consider additional medication if A1c remains elevated  Orders: -     POCT glycosylated hemoglobin (Hb A1C) -     Microalbumin / creatinine urine ratio  Hyperlipidemia associated with type 2 diabetes mellitus (HCC) Assessment & Plan: Currently on rosuvastatin, taken in the morning. No issues reported with current regimen. Discussed the benefits of maintaining cholesterol levels within the target range to reduce cardiovascular risk. - Continue rosuvastatin   Prostate cancer [facility]) Assessment & Plan: Underwent prostate implant procedure on October 24, 2023. Currently on oxybutynin 10 mg and tamsulosin for urinary symptoms. Reports improvement but still experiencing some frequency. Scheduled for follow-up CT scan on December 22, 2023 to assess treatment efficacy. - Continue oxybutynin 10 mg - Continue tamsulosin - Follow-up CT scan on December 22, 2023   Primary hypertension Assessment & Plan: Blood pressure is well-controlled on carvedilol twice daily. Discussed the importance of medication adherence and regular monitoring. - Continue carvedilol twice daily   Nicotine dependence with current use Assessment & Plan: Patient remains precontemplative at this time.   Lung cancer screening declined by patient  Need for pneumococcal vaccine -     Pneumococcal conjugate vaccine 20-valent  Other chronic pain Assessment & Plan: Using gabapentin for pain management, particularly for leg jerking at night. Currently taking gabapentin twice daily, which helps with sleep and pain management. Discussed the importance of consistent use for symptom control. - Continue gabapentin twice daily   Other orders -     Specimen status report  General Health Maintenance Received COVID-19 and shingles vaccines on September 05, 2023. Due for second shingles vaccine in three months. Last eye exam was in May 2024. Current smoker with no interest in quitting at this time.  Discussed the importance of regular screenings and vaccinations. - Patient to obtain second shingles vaccine at pharmacy - Schedule eye exam - Discuss smoking cessation options at future visits   Return in about 3 months (around 03/03/2024) for DM, HTN.      I discussed the assessment and treatment plan with the patient  The patient was provided an opportunity to ask questions and all were answered. The patient agreed with the plan and demonstrated an understanding of the instructions.   The patient was advised to call back or seek an in-person evaluation if the symptoms worsen or if the condition fails to improve as anticipated.    Sherlyn Hay, DO  [facility] [phone] (phone) [phone] (fax)  [facility]

## 2023-12-08 ENCOUNTER — Encounter: Payer: Self-pay | Admitting: Family Medicine

## 2023-12-08 DIAGNOSIS — E1165 Type 2 diabetes mellitus with hyperglycemia: Secondary | ICD-10-CM | POA: Diagnosis not present

## 2023-12-09 LAB — MICROALBUMIN / CREATININE URINE RATIO
Creatinine, Urine: 12.5 mg/dL
Microalb/Creat Ratio: <24 mg/g{creat} (ref 0–29)
Microalbumin, Urine: <3 ug/mL

## 2023-12-09 LAB — SPECIMEN STATUS REPORT

## 2023-12-11 DIAGNOSIS — G8929 Other chronic pain: Secondary | ICD-10-CM | POA: Insufficient documentation

## 2023-12-11 NOTE — Assessment & Plan Note (Signed)
 Currently on rosuvastatin, taken in the morning. No issues reported with current regimen. Discussed the benefits of maintaining cholesterol levels within the target range to reduce cardiovascular risk. - Continue rosuvastatin

## 2023-12-11 NOTE — Assessment & Plan Note (Signed)
Patient remains precontemplative at this time.

## 2023-12-11 NOTE — Assessment & Plan Note (Signed)
 Underwent prostate implant procedure on October 24, 2023. Currently on oxybutynin 10 mg and tamsulosin for urinary symptoms. Reports improvement but still experiencing some frequency. Scheduled for follow-up CT scan on December 22, 2023 to assess treatment efficacy. - Continue oxybutynin 10 mg - Continue tamsulosin - Follow-up CT scan on December 22, 2023

## 2023-12-11 NOTE — Assessment & Plan Note (Signed)
 Current A1c is 7.4, previously 6.8. Reports blood glucose levels ranging from 129-132 mg/dL in the morning and 16-109 mg/dL in the evening. Currently on metformin 500 mg twice daily. Previous higher doses caused diarrhea. Discussed potential impact of recent radiation therapy on blood glucose levels. Consider adding additional medication if A1c remains elevated. Emphasized the importance of maintaining blood glucose levels in the target range to prevent complications. - Continue metformin 500 mg twice daily - Recheck A1c in three months - Consider additional medication if A1c remains elevated

## 2023-12-11 NOTE — Assessment & Plan Note (Signed)
 Blood pressure is well-controlled on carvedilol twice daily. Discussed the importance of medication adherence and regular monitoring. - Continue carvedilol twice daily

## 2023-12-11 NOTE — Assessment & Plan Note (Signed)
 Using gabapentin for pain management, particularly for leg jerking at night. Currently taking gabapentin twice daily, which helps with sleep and pain management. Discussed the importance of consistent use for symptom control. - Continue gabapentin twice daily

## 2023-12-19 ENCOUNTER — Encounter: Payer: Self-pay | Admitting: Urology

## 2023-12-19 NOTE — Telephone Encounter (Signed)
 Patient is referring to Oxybutynin

## 2023-12-22 ENCOUNTER — Ambulatory Visit
Admission: RE | Admit: 2023-12-22 | Discharge: 2023-12-22 | Disposition: A | Discharge status: Home / Self Care | Payer: Medicare HMO | Source: Ambulatory Visit | Attending: Radiation Oncology | Admitting: Radiation Oncology

## 2023-12-22 ENCOUNTER — Encounter: Payer: Self-pay | Admitting: Radiation Oncology

## 2023-12-22 VITALS — BP 124/83 | HR 79 | Temp 98.3°F | Resp 16 | Wt 160.0 lb

## 2023-12-22 DIAGNOSIS — C61 Malignant neoplasm of prostate: Secondary | ICD-10-CM | POA: Insufficient documentation

## 2023-12-22 NOTE — Progress Notes (Signed)
 Radiation Oncology Follow up Note  Name: Gary Benson   Date:   12/22/2023 MRN:  [id number] DOB: [birth year]    This 63 y.o. 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  REFERRING PROVIDER: Sherlyn Hay, DO  HPI: Patient is a 63 year old male now out 1 month having completed I-125 interstitial implant for boost the patient previously receiving external beam treatment to his prostate and pelvic nodes for Gleason 8 adenocarcinoma the prostate presenting with a PSA of 30.  He also has currently on a.ADT therapy.  Seen today in follow-up he is doing well.  Still having some slight increased urinary frequency and urgency although it is much improved.  He is no longer having any significant diarrhea actually tending towards a little constipation.  CT scan today for quality assurance shows excellent source placement.  COMPLICATIONS OF TREATMENT: none  FOLLOW UP COMPLIANCE: keeps appointments   PHYSICAL EXAM:  BP 124/83   Pulse 79   Temp 98.3 F (36.8 C) (Tympanic)   Resp 16   Wt 160 lb (72.6 kg)   BMI 23.63 kg/m  Well-developed well-nourished patient in NAD. HEENT reveals PERLA, EOMI, discs not visualized.  Oral cavity is clear. No oral mucosal lesions are identified. Neck is clear without evidence of cervical or supraclavicular adenopathy. Lungs are clear to A&P. Cardiac examination is essentially unremarkable with regular rate and rhythm without murmur rub or thrill. Abdomen is benign with no organomegaly or masses noted. Motor sensory and DTR levels are equal and symmetric in the upper and lower extremities. Cranial nerves II through XII are grossly intact. Proprioception is intact. No peripheral adenopathy or edema is identified. No motor or sensory  levels are noted. Crude visual fields are within normal range.  RADIOLOGY RESULTS: CT scan for quality assurance reviewed compatible with above-stated findings showing excellent source placement.  No evidence of seed migration to his chest.  PLAN: Present time patient is doing well very low side effect profile 1 month out from both external beam treatment as well as I-125 interstitial implant for boost for Gleason 8 adenocarcinoma.  I have asked to see him back in 3 months with a repeat PSA.  Patient knows to call sooner with any concerns.  He continues on ADT therapy through urology.  I would like to take this opportunity to thank you for allowing me to participate in the care of your patient.Carmina Miller, MD

## 2023-12-24 DIAGNOSIS — C61 Malignant neoplasm of prostate: Secondary | ICD-10-CM | POA: Diagnosis not present

## 2023-12-27 ENCOUNTER — Other Ambulatory Visit: Payer: Self-pay | Admitting: Urology

## 2023-12-29 ENCOUNTER — Ambulatory Visit: Payer: Medicare HMO | Admitting: Cardiology

## 2023-12-30 DIAGNOSIS — Z191 Hormone sensitive malignancy status: Secondary | ICD-10-CM | POA: Diagnosis not present

## 2023-12-30 DIAGNOSIS — C61 Malignant neoplasm of prostate: Secondary | ICD-10-CM | POA: Diagnosis not present

## 2023-12-30 MED ORDER — MIRABEGRON ER 25 MG PO TB24: 25.0000 mg | ORAL_TABLET | Freq: Every day | ORAL | 5 refills | Status: DC

## 2023-12-30 NOTE — Radiation Completion Notes (Signed)
 Patient Name: Gary Benson, Gary Benson MRN: [id number] Date of Birth: 13-Jan-1961 Referring Physician: Jacquenette Shone, M.D. Date of Service: 2023-12-30 Radiation Oncologist: Carmina Miller, M.D. [facility]                             RADIATION ONCOLOGY END OF TREATMENT NOTE     Diagnosis: C61 Malignant neoplasm of prostate Intent: Curative     HPI: Patient is a 64 year old male now out 1 month having completed I-125 interstitial implant for boost the patient previously receiving external beam treatment to his prostate and pelvic nodes for Gleason 8 adenocarcinoma the prostate presenting with a PSA of 30.  He also has currently on a.ADT therapy.  Seen today in follow-up he is doing well.  Still having some slight increased urinary frequency and urgency although it is much improved.  He is no longer having any significant diarrhea actually tending towards a little constipation.  CT scan today for quality assurance shows excellent source placement.      ==========DELIVERED PLANS==========  First Treatment Date: 2023-09-23 Last Treatment Date: 2023-10-30   Plan Name: Prostate Site: Prostate Technique: IMRT Mode: Photon Dose Per Fraction: 1.8 Gy Prescribed Dose (Delivered / Prescribed): 45 Gy / 45 Gy Prescribed Fxs (Delivered / Prescribed): 25 / 25   Plan Name: Prostate Seed Implant Site: Prostate Technique: Radioactive Seed Implant I-125 Mode: Brachytherapy Dose Per Fraction: 115 Gy Prescribed Dose (Delivered / Prescribed): 115 Gy / 115 Gy Prescribed Fxs (Delivered / Prescribed): 1 / 1     ==========ON TREATMENT VISIT DATES========== 2023-09-23, 2023-09-29, 2023-10-07, 2023-10-14, 2023-10-21, 2023-10-28     ==========UPCOMING VISITS==========       ==========APPENDIX - ON TREATMENT VISIT NOTES==========   See weekly On Treatment Notes in Epic for details in the Media tab (listed as Progress notes on the On Treatment Visit Dates listed above).

## 2023-12-31 ENCOUNTER — Telehealth: Payer: Self-pay

## 2023-12-31 NOTE — Telephone Encounter (Signed)
 Phone note in the chart about this also

## 2023-12-31 NOTE — Telephone Encounter (Signed)
 Pt Gary Benson on triage line stating that he is unable to afford the RX for Myrbetriq ($400), he is questioning next steps. Please advise.

## 2024-01-12 MED ORDER — SOLIFENACIN SUCCINATE 5 MG PO TABS
5.0000 mg | ORAL_TABLET | Freq: Every day | ORAL | 3 refills | Status: DC
Start: 2024-01-12 — End: 2024-04-06

## 2024-01-12 NOTE — Addendum Note (Signed)
 Addended by: [name] on: 01/12/2024 11:05 AM   Modules accepted: Orders

## 2024-01-21 ENCOUNTER — Encounter: Payer: Self-pay | Admitting: Urology

## 2024-01-22 ENCOUNTER — Encounter: Payer: Self-pay | Admitting: Urology

## 2024-01-22 MED ORDER — UROGESIC-BLUE 81.6 MG PO TABS
1.0000 | ORAL_TABLET | Freq: Four times a day (QID) | ORAL | 0 refills | Status: DC | PRN
Start: 2024-01-22 — End: 2024-09-01

## 2024-02-03 NOTE — Telephone Encounter (Signed)
 Noted.

## 2024-02-09 DIAGNOSIS — E119 Type 2 diabetes mellitus without complications: Secondary | ICD-10-CM | POA: Diagnosis not present

## 2024-02-09 LAB — HM DIABETES EYE EXAM

## 2024-02-10 DIAGNOSIS — H524 Presbyopia: Secondary | ICD-10-CM | POA: Diagnosis not present

## 2024-02-27 ENCOUNTER — Ambulatory Visit: Payer: Medicare HMO | Admitting: Family Medicine

## 2024-03-10 ENCOUNTER — Other Ambulatory Visit: Payer: Self-pay | Admitting: Family Medicine

## 2024-03-11 ENCOUNTER — Encounter: Payer: Self-pay | Admitting: Family Medicine

## 2024-03-11 DIAGNOSIS — G8929 Other chronic pain: Secondary | ICD-10-CM

## 2024-03-11 MED ORDER — DICLOFENAC SODIUM 1 % EX GEL: 4.0000 g | Freq: Four times a day (QID) | CUTANEOUS | 2 refills | Status: DC | PRN

## 2024-03-15 ENCOUNTER — Telehealth: Payer: Self-pay

## 2024-03-15 DIAGNOSIS — G8929 Other chronic pain: Secondary | ICD-10-CM

## 2024-03-15 NOTE — Telephone Encounter (Signed)
 Copied from CRM [id number]. Topic: Clinical - Prescription Issue >> Mar 15, 2024  8:55 AM Deaijah H wrote: Reason for CRM: Patient called in to let Dr. Athena Bland know the prescription diclofenac  Sodium ([facility] DICLOFENAC  SODIUM) 1 % GEL is currently on backorder and if anything else could be sent in.

## 2024-03-17 ENCOUNTER — Encounter: Payer: Self-pay | Admitting: Family Medicine

## 2024-03-17 MED ORDER — LIDOCAINE 4 % EX CREA: 1.0000 | TOPICAL_CREAM | Freq: Four times a day (QID) | CUTANEOUS | 0 refills | Status: DC | PRN

## 2024-03-17 NOTE — Addendum Note (Signed)
 Addended by: Judyann Number on: 03/17/2024 03:47 PM   Modules accepted: Orders

## 2024-03-22 ENCOUNTER — Encounter: Payer: Self-pay | Admitting: Family Medicine

## 2024-03-22 DIAGNOSIS — G8929 Other chronic pain: Secondary | ICD-10-CM

## 2024-03-22 DIAGNOSIS — G2581 Restless legs syndrome: Secondary | ICD-10-CM

## 2024-03-25 MED ORDER — MELOXICAM 7.5 MG PO TABS: 7.5000 mg | ORAL_TABLET | Freq: Every day | ORAL | 0 refills | Status: DC

## 2024-03-25 MED ORDER — MELOXICAM 7.5 MG PO TABS: 7.5000 mg | ORAL_TABLET | Freq: Every day | ORAL | 0 refills | Status: DC | PRN

## 2024-03-25 MED ORDER — ZIKS ARTHRITIS PAIN RELIEF 0.025-1-12 % EX CREA: 1.0000 | TOPICAL_CREAM | Freq: Three times a day (TID) | CUTANEOUS | 1 refills | Status: DC | PRN

## 2024-03-25 NOTE — Telephone Encounter (Signed)
 Please see the pt response

## 2024-03-31 ENCOUNTER — Other Ambulatory Visit: Payer: Self-pay

## 2024-03-31 DIAGNOSIS — C61 Malignant neoplasm of prostate: Secondary | ICD-10-CM | POA: Diagnosis not present

## 2024-04-01 LAB — PSA: Prostate Specific Ag, Serum: <0.1 ng/mL (ref 0.0–4.0)

## 2024-04-02 ENCOUNTER — Encounter: Payer: Self-pay | Admitting: Family Medicine

## 2024-04-02 ENCOUNTER — Ambulatory Visit (INDEPENDENT_AMBULATORY_CARE_PROVIDER_SITE_OTHER): Admitting: Family Medicine

## 2024-04-02 VITALS — BP 113/74 | HR 84 | Resp 14 | Ht 69.0 in | Wt 164.9 lb

## 2024-04-02 DIAGNOSIS — M87052 Idiopathic aseptic necrosis of left femur: Secondary | ICD-10-CM | POA: Diagnosis not present

## 2024-04-02 DIAGNOSIS — M87051 Idiopathic aseptic necrosis of right femur: Secondary | ICD-10-CM

## 2024-04-02 DIAGNOSIS — Z1211 Encounter for screening for malignant neoplasm of colon: Secondary | ICD-10-CM

## 2024-04-02 DIAGNOSIS — Z130 Encounter for screening for diseases of the blood and blood-forming organs and certain disorders involving the immune mechanism: Secondary | ICD-10-CM

## 2024-04-02 DIAGNOSIS — Z1329 Encounter for screening for other suspected endocrine disorder: Secondary | ICD-10-CM | POA: Diagnosis not present

## 2024-04-02 DIAGNOSIS — E1165 Type 2 diabetes mellitus with hyperglycemia: Secondary | ICD-10-CM | POA: Diagnosis not present

## 2024-04-02 DIAGNOSIS — Z5320 Procedure and treatment not carried out because of patient's decision for unspecified reasons: Secondary | ICD-10-CM

## 2024-04-02 DIAGNOSIS — R252 Cramp and spasm: Secondary | ICD-10-CM | POA: Diagnosis not present

## 2024-04-02 DIAGNOSIS — C61 Malignant neoplasm of prostate: Secondary | ICD-10-CM | POA: Diagnosis not present

## 2024-04-02 DIAGNOSIS — I252 Old myocardial infarction: Secondary | ICD-10-CM | POA: Diagnosis not present

## 2024-04-02 DIAGNOSIS — F429 Obsessive-compulsive disorder, unspecified: Secondary | ICD-10-CM

## 2024-04-02 DIAGNOSIS — I10 Essential (primary) hypertension: Secondary | ICD-10-CM | POA: Diagnosis not present

## 2024-04-02 DIAGNOSIS — F5104 Psychophysiologic insomnia: Secondary | ICD-10-CM | POA: Diagnosis not present

## 2024-04-02 DIAGNOSIS — Z13228 Encounter for screening for other metabolic disorders: Secondary | ICD-10-CM | POA: Diagnosis not present

## 2024-04-02 DIAGNOSIS — Z7984 Long term (current) use of oral hypoglycemic drugs: Secondary | ICD-10-CM

## 2024-04-02 MED ORDER — METFORMIN HCL ER 500 MG PO TB24: 500.0000 mg | ORAL_TABLET | Freq: Two times a day (BID) | ORAL | 2 refills | Status: DC

## 2024-04-02 MED ORDER — LISINOPRIL 5 MG PO TABS: 5.0000 mg | ORAL_TABLET | ORAL | 3 refills | Status: AC

## 2024-04-02 MED ORDER — CARVEDILOL 3.125 MG PO TABS: 3.1250 mg | ORAL_TABLET | Freq: Two times a day (BID) | ORAL | 3 refills | Status: AC

## 2024-04-02 MED ORDER — SERTRALINE HCL 100 MG PO TABS: 100.0000 mg | ORAL_TABLET | ORAL | 3 refills | Status: AC

## 2024-04-02 NOTE — Assessment & Plan Note (Signed)
 Gabapentin  helps. Patient will be starting meloxicam  7.5 mg daily prn.

## 2024-04-02 NOTE — Progress Notes (Unsigned)
 Established patient visit   Patient: Gary Benson   DOB: May 01, 1961   63 y.o. Male  MRN: [id number] Visit Date: 04/02/2024  Today's healthcare provider: LAURAINE LOISE BUOY, DO   Chief Complaint  Patient presents with   Follow-up   Hypertension   Diabetes   Subjective    Hypertension  Diabetes   Gary Benson is a 63 year old male with prostate cancer who presents for follow-up regarding leg pain and medication management.  He experiences persistent leg pain, particularly in the calves and the area near where he received a hormone shot for his prostate cancer. The pain occurs after being on his feet for a short period and is described as cramping and soreness. He uses Voltaren  cream, which provides relief, and takes meloxicam  as needed for pain management. His legs feel tired and tight after activity, and he tends to veer to the right when walking, leading to minor injuries such as bumping into door frames.  He has a history of prostate cancer, for which he underwent radiation treatment with seed implants in February. He experiences occasional irritation from the radiation seeds and takes AZO once or twice a week for relief. His PSA levels have significantly decreased from 33.8 to <0.1.  He experiences hip pain, which is somewhat alleviated by gabapentin , and this medication also helps him sleep at night. He has a history of diabetes and monitors his blood sugar, which is generally well-controlled, with occasional readings in the 70s and a recent low of 69.  He has been prescribed solifenacin  by urology, which has helped reduce urinary frequency, allowing him to get up only once or twice at night. He reports he previously experienced dry mouth with Urogesic-Blue and is no longer taking tamsulosin .  He recently had his eyes checked and received new glasses, which have improved his vision. He smokes and has no interest in  quitting.      Medications: Outpatient Medications Prior to Visit  Medication Sig   acetaminophen  (TYLENOL ) 500 MG tablet Take 1,000 mg by mouth every morning.   aspirin  EC 81 MG tablet Take 81 mg by mouth daily. Swallow whole.   Blood Glucose Monitoring Suppl (TRUE METRIX AIR GLUCOSE METER) w/Device KIT USE AS DIRECTED   cyanocobalamin  (VITAMIN B12) 1000 MCG tablet Take 1,000 mcg by mouth daily.   diclofenac  Sodium (CVS DICLOFENAC  SODIUM) 1 % GEL Apply 4 g topically 4 (four) times daily as needed.   EPINEPHrine  0.3 mg/0.3 mL IJ SOAJ injection Inject 0.3 mg into the muscle as needed for anaphylaxis.   famotidine (PEPCID) 20 MG tablet Take 20 mg by mouth every morning.   gabapentin  (NEURONTIN ) 300 MG capsule TAKE 1 CAPSULE THREE TIMES DAILY   Lancet Devices (TRUEDRAW LANCING DEVICE) MISC USE AS DIRECTED   meloxicam  (MOBIC ) 7.5 MG tablet Take 1 tablet (7.5 mg total) by mouth daily as needed for pain.   Methen-Hyosc-Meth Blue-Na Phos (UROGESIC-BLUE) 81.6 MG TABS Take 1 tablet (81.6 mg total) by mouth 4 (four) times daily as needed.   potassium chloride  (KLOR-CON ) 10 MEQ tablet TAKE 1 TABLET EVERY DAY (Patient taking differently: Take 10 mEq by mouth every morning.)   rosuvastatin  (CRESTOR ) 20 MG tablet TAKE 1 TABLET EVERY DAY   SHINGRIX injection Inject 0.5 mLs into the muscle once.   TRUE METRIX BLOOD GLUCOSE TEST test strip TEST BLOOD SUGAR IN THE MORNING, AT NOON, AND AT BEDTIME   TRUEplus Lancets 33G MISC TEST BLOOD  SUGAR IN THE MORNING, AT NOON, AND AT BEDTIME   [DISCONTINUED] Capsaicin-Menthol-Methyl Sal (CAPSAICIN-METHYL SAL-MENTHOL) 0.025-1-12 % CREA Apply 1 Application topically 3 (three) times daily as needed.   [DISCONTINUED] carvedilol  (COREG ) 3.125 MG tablet TAKE 1 TABLET TWICE DAILY WITH MEALS   [DISCONTINUED] lisinopril  (ZESTRIL ) 5 MG tablet TAKE 1 TABLET EVERY DAY (Patient taking differently: Take 5 mg by mouth every morning.)   [DISCONTINUED] metFORMIN  (GLUCOPHAGE -XR) 500 MG  24 hr tablet Take 1 tablet (500 mg total) by mouth 2 (two) times daily with a meal.   [DISCONTINUED] sertraline  (ZOLOFT ) 100 MG tablet TAKE 1 TABLET EVERY DAY (Patient taking differently: Take 100 mg by mouth every morning.)   [DISCONTINUED] solifenacin  (VESICARE ) 5 MG tablet Take 1 tablet (5 mg total) by mouth daily.   [DISCONTINUED] tamsulosin  (FLOMAX ) 0.4 MG CAPS capsule Take 1 capsule (0.4 mg total) by mouth daily.   No facility-administered medications prior to visit.        Objective    BP 113/74 (BP Location: Right Arm, Patient Position: Sitting, Cuff Size: Normal)   Pulse 84   Resp 14   Ht 5' 9 (1.753 m)   Wt 164 lb 14.4 oz (74.8 kg)   SpO2 99%   BMI 24.35 kg/m     Physical Exam Vitals and nursing note reviewed.  Constitutional:      General: He is not in acute distress.    Appearance: Normal appearance.  HENT:     Head: Normocephalic and atraumatic.  Eyes:     General: No scleral icterus.    Conjunctiva/sclera: Conjunctivae normal.  Cardiovascular:     Rate and Rhythm: Normal rate.  Pulmonary:     Effort: Pulmonary effort is normal.  Neurological:     Mental Status: He is alert and oriented to person, place, and time. Mental status is at baseline.     Gait: Gait abnormal (antalgic).  Psychiatric:        Mood and Affect: Mood normal.        Behavior: Behavior normal.      Results for orders placed or performed in visit on 04/02/24  Microalbumin / creatinine urine ratio  Result Value Ref Range   Creatinine, Urine 15.3 Not Estab. mg/dL   Microalbumin, Urine 84.8 Not Estab. ug/mL   Microalb/Creat Ratio 99 (H) 0 - 29 mg/g creat  Comprehensive metabolic panel with GFR  Result Value Ref Range   Glucose 92 70 - 99 mg/dL   BUN 9 8 - 27 mg/dL   Creatinine, Ser 9.15 0.76 - 1.27 mg/dL   eGFR 99 >40 fO/fpw/8.26   BUN/Creatinine Ratio 11 10 - 24   Sodium 138 134 - 144 mmol/L   Potassium 4.2 3.5 - 5.2 mmol/L   Chloride 103 96 - 106 mmol/L   CO2 21 20 - 29  mmol/L   Calcium  9.6 8.6 - 10.2 mg/dL   Total Protein 6.8 6.0 - 8.5 g/dL   Albumin 4.4 3.9 - 4.9 g/dL   Globulin, Total 2.4 1.5 - 4.5 g/dL   Bilirubin Total <9.7 0.0 - 1.2 mg/dL   Alkaline Phosphatase 73 44 - 121 IU/L   AST 14 0 - 40 IU/L   ALT 12 0 - 44 IU/L  Hemoglobin A1c  Result Value Ref Range   Hgb A1c MFr Bld 6.5 (H) 4.8 - 5.6 %   Est. average glucose Bld gHb Est-mCnc 140 mg/dL  Lipid panel  Result Value Ref Range   Cholesterol, Total 109 100 - 199 mg/dL  Triglycerides 137 0 - 149 mg/dL   HDL 43 >60 mg/dL   VLDL Cholesterol Cal 24 5 - 40 mg/dL   LDL Chol Calc (NIH) 42 0 - 99 mg/dL   Chol/HDL Ratio 2.5 0.0 - 5.0 ratio  Vitamin B12  Result Value Ref Range   Vitamin B-12 638 232 - 1,245 pg/mL  VITAMIN D 25 Hydroxy (Vit-D Deficiency, Fractures)  Result Value Ref Range   Vit D, 25-Hydroxy 22.0 (L) 30.0 - 100.0 ng/mL  Magnesium  Result Value Ref Range   Magnesium 1.8 1.6 - 2.3 mg/dL  TSH Rfx on Abnormal to Free T4  Result Value Ref Range   TSH 1.530 0.450 - 4.500 uIU/mL    Assessment & Plan    Muscle cramping -     VITAMIN D 25 Hydroxy (Vit-D Deficiency, Fractures) -     Magnesium -     TSH Rfx on Abnormal to Free T4 -     VAS US  ABI WITH/WO TBI; Future  Type 2 diabetes mellitus with hyperglycemia, without long-term current use of insulin (HCC) -     Microalbumin / creatinine urine ratio -     Comprehensive metabolic panel with GFR -     Hemoglobin A1c -     Lipid panel -     Vitamin B12 -     metFORMIN  HCl ER; Take 1 tablet (500 mg total) by mouth 2 (two) times daily with a meal.  Dispense: 180 tablet; Refill: 2  Prostate cancer (HCC)  Avascular necrosis of bones of both hips (HCC) Assessment & Plan: Gabapentin  helps. Patient will be starting meloxicam  7.5 mg daily prn.   Primary hypertension -     Lisinopril ; Take 1 tablet (5 mg total) by mouth every morning.  Dispense: 90 tablet; Refill: 3 -     Carvedilol ; Take 1 tablet (3.125 mg total) by mouth 2  (two) times daily with a meal.  Dispense: 180 tablet; Refill: 3  History of MI (myocardial infarction) -     Lisinopril ; Take 1 tablet (5 mg total) by mouth every morning.  Dispense: 90 tablet; Refill: 3 -     Carvedilol ; Take 1 tablet (3.125 mg total) by mouth 2 (two) times daily with a meal.  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  Dispense: 90 tablet; Refill: 3  Psychophysiological insomnia -     Sertraline  HCl; Take 1 tablet (100 mg total) by mouth every morning.  Dispense: 90 tablet; Refill: 3  Screening for endocrine, metabolic and immunity disorder -     Vitamin B12  Lung cancer screening declined by patient  Encounter for colorectal cancer screening -     Cologuard      Leg Pain and Cramping Suspected arterial issues; ultrasound ordered. - Order ultrasound of the legs to assess for arterial issues. - Check blood work related to leg pain and cramping. - Continue Voltaren  cream and meloxicam  as needed for pain. - Continue gabapentin  for pain and sleep.  Prostate Cancer PSA improved post-brachytherapy. Occasional irritation managed with AZO. - Continue AZO as needed for irritation. - Attend follow-up appointment with new urologist on July 1st.  Defer to specialist management. - Attend follow-up appointment with Dr. Camelia, radiation oncology, on Monday.  Type 2 Diabetes Mellitus Blood sugar generally well-controlled. - Refill metformin  and lisinopril  prescriptions. -Check A1c today. - Continue monitoring blood  sugar levels.  Hypertension Managed with lisinopril  and carvedilol . - Refill lisinopril  5 mg daily and carvedilol  3.125 mg twice daily.  Urinary Symptoms Improved frequency with solifenacin . - Continue solifenacin  for urinary symptoms. - Followed by urology; defer to specialist management.  OCD; psychophysiological  insomnia Chronic, stable.  Well-controlled on sertraline .  Continue sertraline  100 mg daily.  General Health Maintenance Open to colon cancer screening. Received shingles vaccine. Advised COVID booster. - Send Cologuard kit for colon cancer screening. - Advise to get COVID booster for 2024-2025 season. - Discuss lung cancer screening and smoking cessation again at future visits.    Return in about 4 months (around 07/27/2024) for CPE and 07/27/24 or later for mAWV with AWV nurse.      I discussed the assessment and treatment plan with the patient  The patient was provided an opportunity to ask questions and all were answered. The patient agreed with the plan and demonstrated an understanding of the instructions.   The patient was advised to call back or seek an in-person evaluation if the symptoms worsen or if the condition fails to improve as anticipated.    LAURAINE LOISE BUOY, DO  [facility] [phone] (phone) [phone] (fax)  [facility]

## 2024-04-03 LAB — COMPREHENSIVE METABOLIC PANEL WITH GFR
ALT: 12 IU/L (ref 0–44)
AST: 14 IU/L (ref 0–40)
Albumin: 4.4 g/dL (ref 3.9–4.9)
Alkaline Phosphatase: 73 IU/L (ref 44–121)
BUN/Creatinine Ratio: 11 (ref 10–24)
BUN: 9 mg/dL (ref 8–27)
Bilirubin Total: <0.2 mg/dL (ref 0.0–1.2)
CO2: 21 mmol/L (ref 20–29)
Calcium: 9.6 mg/dL (ref 8.6–10.2)
Chloride: 103 mmol/L (ref 96–106)
Creatinine, Ser: 0.84 mg/dL (ref 0.76–1.27)
Globulin, Total: 2.4 g/dL (ref 1.5–4.5)
Glucose: 92 mg/dL (ref 70–99)
Potassium: 4.2 mmol/L (ref 3.5–5.2)
Sodium: 138 mmol/L (ref 134–144)
Total Protein: 6.8 g/dL (ref 6.0–8.5)
eGFR: 99 mL/min/{1.73_m2} (ref >59)

## 2024-04-03 LAB — LIPID PANEL
Chol/HDL Ratio: 2.5 ratio (ref 0.0–5.0)
Cholesterol, Total: 109 mg/dL (ref 100–199)
HDL: 43 mg/dL (ref >39)
LDL Chol Calc (NIH): 42 mg/dL (ref 0–99)
Triglycerides: 137 mg/dL (ref 0–149)
VLDL Cholesterol Cal: 24 mg/dL (ref 5–40)

## 2024-04-03 LAB — VITAMIN B12: Vitamin B-12: 638 pg/mL (ref 232–1245)

## 2024-04-03 LAB — VITAMIN D 25 HYDROXY (VIT D DEFICIENCY, FRACTURES): Vit D, 25-Hydroxy: 22 ng/mL — ABNORMAL LOW (ref 30.0–100.0)

## 2024-04-03 LAB — MICROALBUMIN / CREATININE URINE RATIO
Creatinine, Urine: 15.3 mg/dL
Microalb/Creat Ratio: 99 mg/g{creat} — ABNORMAL HIGH (ref 0–29)
Microalbumin, Urine: 15.1 ug/mL

## 2024-04-03 LAB — HEMOGLOBIN A1C
Est. average glucose Bld gHb Est-mCnc: 140 mg/dL
Hgb A1c MFr Bld: 6.5 % — ABNORMAL HIGH (ref 4.8–5.6)

## 2024-04-03 LAB — TSH RFX ON ABNORMAL TO FREE T4: TSH: 1.53 u[IU]/mL (ref 0.450–4.500)

## 2024-04-03 LAB — MAGNESIUM: Magnesium: 1.8 mg/dL (ref 1.6–2.3)

## 2024-04-05 ENCOUNTER — Encounter: Payer: Self-pay | Admitting: Radiation Oncology

## 2024-04-05 ENCOUNTER — Ambulatory Visit
Admission: RE | Admit: 2024-04-05 | Discharge: 2024-04-05 | Disposition: A | Discharge status: Home / Self Care | Source: Ambulatory Visit | Attending: Radiation Oncology | Admitting: Radiation Oncology

## 2024-04-05 VITALS — BP 118/75 | HR 82 | Temp 98.6°F | Resp 19 | Wt 164.9 lb

## 2024-04-05 DIAGNOSIS — C61 Malignant neoplasm of prostate: Secondary | ICD-10-CM | POA: Diagnosis not present

## 2024-04-05 DIAGNOSIS — Z191 Hormone sensitive malignancy status: Secondary | ICD-10-CM | POA: Diagnosis not present

## 2024-04-05 NOTE — Progress Notes (Unsigned)
 04/06/2024 1:41 PM   Gary Benson Apr 05, 1961 [id number]  Referring provider: Donzella Lauraine SAILOR, DO [address] [place],  [place] [address]  Urological history: 1.  High risk prostate cancer - PSA (04/2024) <0.1 - I PSA 33.8 - Gleason 4+3 on the right side. TRUS volume 28.6 - PET (08/2023) no evidence of metastasis - IMRT w/ brachy-boost (11/2023) - ADT through (10/2026)   No chief complaint on file.  HPI: Gary Benson is a 63 y.o. man who presents today for follow up.   Previous records reviewed.     reports that he has been smoking cigarettes. He started smoking about 40 years ago. He has a 40.6 pack-year smoking history. He has been exposed to tobacco smoke. He has never used smokeless tobacco. He reports that he does not drink alcohol and does not use drugs.  Lab Results  Component Value Date   WBC 5.8 10/20/2023   HGB 11.6 (L) 10/20/2023   HCT 33.8 (L) 10/20/2023   MCV 87.6 10/20/2023   PLT 177 10/20/2023    Lab Results  Component Value Date   CREATININE 0.84 04/02/2024    No results found for: PSA  No results found for: TESTOSTERONE  Lab Results  Component Value Date   HGBA1C 6.5 (H) 04/02/2024    Urinalysis    Component Value Date/Time   COLORURINE COLORLESS (A) 11/19/2023 1120   APPEARANCEUR CLEAR (A) 11/19/2023 1120   APPEARANCEUR Clear 06/18/2023 1519   LABSPEC 1.002 (L) 11/19/2023 1120   LABSPEC 1.015 06/03/2014 1214   PHURINE 6.0 11/19/2023 1120   GLUCOSEU NEGATIVE 11/19/2023 1120   GLUCOSEU Negative 06/03/2014 1214   HGBUR NEGATIVE 11/19/2023 1120   BILIRUBINUR NEGATIVE 11/19/2023 1120   BILIRUBINUR Negative 06/18/2023 1519   BILIRUBINUR Negative 06/03/2014 1214   KETONESUR NEGATIVE 11/19/2023 1120   PROTEINUR NEGATIVE 11/19/2023 1120   NITRITE NEGATIVE 11/19/2023 1120   LEUKOCYTESUR NEGATIVE 11/19/2023 1120   LEUKOCYTESUR Negative 06/03/2014 1214    Lab Results  Component Value Date   LABMICR 15.1  04/02/2024   WBCUA 11-30 (A) 06/18/2023   LABEPIT 0-10 06/18/2023   BACTERIA NONE SEEN 11/19/2023    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    Prostate cancer (HCC)    Psychophysiologic insomnia    RLS (restless legs syndrome)    Stroke (HCC) 2015   Tobacco use     Surgical History: Past Surgical History:  Procedure Laterality Date   ANKLE FRACTURE SURGERY Right    CORONARY ANGIOPLASTY WITH STENT PLACEMENT     EYE SURGERY Bilateral    as a child   HERNIA REPAIR Right    RADIOACTIVE SEED IMPLANT N/A 11/24/2023   Procedure: RADIOACTIVE SEED IMPLANT/BRACHYTHERAPY IMPLANT (76 seeds);  Surgeon: Penne Knee, MD;  Location: [facility] ORS;  Service: Urology;  Laterality: N/A;  Home Medications:  Allergies as of 04/06/2024       Reactions   Bee Venom Anaphylaxis        Medication List        Accurate as of April 05, 2024  1:41 PM. If you have any questions, ask your nurse or doctor.          acetaminophen  500 MG tablet Commonly known as: TYLENOL  Take 1,000 mg by mouth every morning.   aspirin  EC 81 MG tablet Take 81 mg by mouth daily. Swallow whole.   carvedilol  3.125 MG tablet Commonly known as: COREG  Take 1 tablet (3.125 mg total) by mouth 2 (two) times daily with a meal.   cyanocobalamin  1000 MCG tablet Commonly known as: VITAMIN B12 Take 1,000 mcg by mouth daily.   diclofenac  Sodium 1 % Gel Commonly known as: CVS Diclofenac   Sodium Apply 4 g topically 4 (four) times daily as needed.   EPINEPHrine  0.3 mg/0.3 mL Soaj injection Commonly known as: EPI-PEN Inject 0.3 mg into the muscle as needed for anaphylaxis.   famotidine 20 MG tablet Commonly known as: PEPCID Take 20 mg by mouth every morning.   gabapentin  300 MG capsule Commonly known as: NEURONTIN  TAKE 1 CAPSULE THREE TIMES DAILY   lisinopril  5 MG tablet Commonly known as: ZESTRIL  Take 1 tablet (5 mg total) by mouth every morning.   meloxicam  7.5 MG tablet Commonly known as: MOBIC  Take 1 tablet (7.5 mg total) by mouth daily as needed for pain.   metFORMIN  500 MG 24 hr tablet Commonly known as: GLUCOPHAGE -XR Take 1 tablet (500 mg total) by mouth 2 (two) times daily with a meal.   potassium chloride  10 MEQ tablet Commonly known as: KLOR-CON  TAKE 1 TABLET EVERY DAY What changed: when to take this   rosuvastatin  20 MG tablet Commonly known as: CRESTOR  TAKE 1 TABLET EVERY DAY   sertraline  100 MG tablet Commonly known as: ZOLOFT  Take 1 tablet (100 mg total) by mouth every morning.   Shingrix injection Generic drug: Zoster Vaccine Adjuvanted Inject 0.5 mLs into the muscle once.   solifenacin  5 MG tablet Commonly known as: VESICARE  Take 1 tablet (5 mg total) by mouth daily.   True Metrix Air Glucose Meter w/Device Kit USE AS DIRECTED   True Metrix Blood Glucose Test test strip Generic drug: glucose blood TEST BLOOD SUGAR IN THE MORNING, AT NOON, AND AT BEDTIME   TRUEdraw Lancing Device Misc USE AS DIRECTED   TRUEplus Lancets 33G Misc TEST BLOOD SUGAR IN THE MORNING, AT NOON, AND AT BEDTIME   Urogesic-Blue 81.6 MG Tabs Take 1 tablet (81.6 mg total) by mouth 4 (four) times daily as needed.        Allergies:  Allergies  Allergen Reactions   Bee Venom Anaphylaxis    Family History: Family History  Problem Relation Age of Onset   Diabetes Mother    Heart disease Father    Diabetes Sister     Social History: See HPI  for pertinent social history  ROS: Pertinent ROS in HPI  Physical Exam: There were no vitals taken for this visit.  Constitutional:  Well nourished. Alert and oriented, No acute distress. HEENT: [place] AT, moist mucus membranes.  Trachea midline, no masses. Cardiovascular: No clubbing, cyanosis, or edema. Respiratory: Normal respiratory effort, no increased work of breathing. GI: Abdomen is soft, non tender, non distended, no abdominal masses. Liver and spleen not palpable.  No hernias appreciated.  Stool sample for occult testing is not indicated.  GU: No CVA tenderness.  No bladder fullness or masses.  Patient with circumcised/uncircumcised phallus. ***Foreskin easily retracted***  Urethral meatus is patent.  No penile discharge. No penile lesions or rashes. Scrotum without lesions, cysts, rashes and/or edema.  Testicles are located scrotally bilaterally. No masses are appreciated in the testicles. Left and right epididymis are normal. Rectal: Patient with  normal sphincter tone. Anus and perineum without scarring or rashes. No rectal masses are appreciated. Prostate is approximately *** grams, *** nodules are appreciated. Seminal vesicles are normal. Skin: No rashes, bruises or suspicious lesions. Lymph: No cervical or inguinal adenopathy. Neurologic: Grossly intact, no focal deficits, moving all 4 extremities. Psychiatric: Normal mood and affect.  Laboratory Data: See EPIC and HPI  I have reviewed the labs.   Pertinent Imaging: @CT @ @ultrasound @ @KUB @ I have independently reviewed the films.    Assessment & Plan:  ***  1. High risk prostate cancer - ***  No follow-ups on file.  These notes generated with voice recognition software. I apologize for typographical errors.  CLOTILDA HELON RIGGERS  [facility] [address]  [address] [place], [place] [address] [phone]

## 2024-04-05 NOTE — Progress Notes (Signed)
 Radiation Oncology Follow up Note  Name: Gary Benson   Date:   04/05/2024 MRN:  [id number] DOB: 1961-06-09    This 63 y.o. male presents to the clinic today for 62-month follow-up status post both external beam radiation therapy to his prostate as well as pelvic nodes for an stage IIIa Gleason 8 (4+4) adenocarcinoma present with a PSA in the 30 range  REFERRING PROVIDER: Donzella Lauraine SAILOR, DO  HPI: Patient is a 63 year old male now out 4 months having completed both external beam as well as I-125 interstitial implant for boost in a patient with stage IIIa Gleason 8 adenocarcinoma the prostate presenting with a PSA in the 30 range seen today in routine follow-up he is doing well.SABRA  He does occasionally use Azo.  No significant lower urinary tract symptoms diarrhea or fatigue.  His most recent PSA is less than 0.1.  He has switched to another urologist in [facility] practice and she is left.  I would advocate for continued ADT therapy for at least 2 to 3 years  COMPLICATIONS OF TREATMENT: none  FOLLOW UP COMPLIANCE: keeps appointments   PHYSICAL EXAM:  BP 118/75   Pulse 82   Temp 98.6 F (37 C)   Resp 19   Wt 164 lb 14.4 oz (74.8 kg)   SpO2 99%   BMI 24.35 kg/m  Well-developed well-nourished patient in NAD. HEENT reveals PERLA, EOMI, discs not visualized.  Oral cavity is clear. No oral mucosal lesions are identified. Neck is clear without evidence of cervical or supraclavicular adenopathy. Lungs are clear to A&P. Cardiac examination is essentially unremarkable with regular rate and rhythm without murmur rub or thrill. Abdomen is benign with no organomegaly or masses noted. Motor sensory and DTR levels are equal and symmetric in the upper and lower extremities. Cranial nerves II through XII are grossly intact. Proprioception is intact. No peripheral adenopathy or edema is identified. No motor or sensory levels are noted. Crude visual fields are within normal range.  RADIOLOGY RESULTS: No  current films to review  PLAN: Present time patient is under excellent biochemical control of his prostate cancer.  I would advocate to continue ADT therapy for at least the next 2 to 3 years and he will be seeing his new urologist in Dr[facility] practice in the next several weeks.  Otherwise am pleased with his overall side effect profile.  I have asked to see him back in 6 months with a repeat PSA at that time.  Patient knows to call with any concerns.  I would like to take this opportunity to thank you for allowing me to participate in the care of your patient.SABRA Marcey Penton, MD

## 2024-04-06 ENCOUNTER — Ambulatory Visit: Admitting: Urology

## 2024-04-06 ENCOUNTER — Encounter: Payer: Self-pay | Admitting: Urology

## 2024-04-06 ENCOUNTER — Ambulatory Visit: Payer: Self-pay | Admitting: Family Medicine

## 2024-04-06 VITALS — BP 127/80 | HR 87 | Ht 69.0 in | Wt 164.0 lb

## 2024-04-06 DIAGNOSIS — E559 Vitamin D deficiency, unspecified: Secondary | ICD-10-CM

## 2024-04-06 DIAGNOSIS — R3915 Urgency of urination: Secondary | ICD-10-CM | POA: Diagnosis not present

## 2024-04-06 DIAGNOSIS — C61 Malignant neoplasm of prostate: Secondary | ICD-10-CM

## 2024-04-06 DIAGNOSIS — S61411A Laceration without foreign body of right hand, initial encounter: Secondary | ICD-10-CM

## 2024-04-06 MED ORDER — SOLIFENACIN SUCCINATE 10 MG PO TABS: 10.0000 mg | ORAL_TABLET | Freq: Every day | ORAL | 11 refills | Status: DC

## 2024-04-06 MED ORDER — VITAMIN D (ERGOCALCIFEROL) 1.25 MG (50000 UNIT) PO CAPS: 50000.0000 [IU] | ORAL_CAPSULE | ORAL | 1 refills | Status: DC

## 2024-04-06 MED ORDER — AMOXICILLIN 875 MG PO TABS: 875.0000 mg | ORAL_TABLET | Freq: Two times a day (BID) | ORAL | 0 refills | Status: DC

## 2024-04-06 MED ORDER — LEUPROLIDE ACETATE (6 MONTH) 45 MG ~~LOC~~ KIT
45.0000 mg | PACK | Freq: Once | SUBCUTANEOUS | Status: AC
  Administered 2024-04-06: 45 mg via SUBCUTANEOUS

## 2024-04-06 MED ORDER — MUPIROCIN 2 % EX OINT
1.0000 | TOPICAL_OINTMENT | Freq: Two times a day (BID) | CUTANEOUS | 0 refills | Status: DC
Start: 2024-04-06 — End: 2024-09-01

## 2024-04-06 NOTE — Progress Notes (Signed)
 Eligard  SubQ Injection   Due to Prostate Cancer patient is present today for a Eligard  Injection.  Medication: Eligard  6 month Dose: 45 mg  Location: right  Lot: [id number] Exp: 06/2025  Patient tolerated well, no complications were noted  Performed by: Laymon Ned  Per Dr. Penne patient is to continue therapy for 6 months . Patient's next follow up was scheduled for 6months. This appointment was scheduled using wheel and given to patient today along with reminder continue on Vitamin D 800-1000iu and Calcium  1000-1200mg  daily while on Androgen Deprivation Therapy.  PA approval dates:

## 2024-04-06 NOTE — Patient Instructions (Signed)
 Please take calcium  400 mg three times daily with food along with Vitamin D 800 international units daily with food

## 2024-04-07 ENCOUNTER — Ambulatory Visit: Payer: Self-pay | Admitting: Urology

## 2024-04-08 DIAGNOSIS — Z1211 Encounter for screening for malignant neoplasm of colon: Secondary | ICD-10-CM | POA: Diagnosis not present

## 2024-04-08 DIAGNOSIS — Z1212 Encounter for screening for malignant neoplasm of rectum: Secondary | ICD-10-CM | POA: Diagnosis not present

## 2024-04-15 ENCOUNTER — Encounter (INDEPENDENT_AMBULATORY_CARE_PROVIDER_SITE_OTHER)

## 2024-04-15 LAB — COLOGUARD: COLOGUARD: NEGATIVE

## 2024-04-20 ENCOUNTER — Other Ambulatory Visit: Payer: Self-pay | Admitting: Family Medicine

## 2024-04-20 DIAGNOSIS — G2581 Restless legs syndrome: Secondary | ICD-10-CM

## 2024-04-20 DIAGNOSIS — G8929 Other chronic pain: Secondary | ICD-10-CM

## 2024-04-22 ENCOUNTER — Other Ambulatory Visit: Payer: Self-pay | Admitting: Family Medicine

## 2024-04-22 DIAGNOSIS — E1165 Type 2 diabetes mellitus with hyperglycemia: Secondary | ICD-10-CM

## 2024-05-05 ENCOUNTER — Other Ambulatory Visit: Payer: Self-pay

## 2024-05-05 MED ORDER — TAMSULOSIN HCL 0.4 MG PO CAPS: 0.4000 mg | ORAL_CAPSULE | Freq: Every day | ORAL | 0 refills | Status: DC

## 2024-05-26 ENCOUNTER — Encounter (INDEPENDENT_AMBULATORY_CARE_PROVIDER_SITE_OTHER)

## 2024-05-27 ENCOUNTER — Other Ambulatory Visit: Payer: Self-pay | Admitting: Urology

## 2024-05-27 ENCOUNTER — Other Ambulatory Visit: Payer: Self-pay | Admitting: Family Medicine

## 2024-05-27 DIAGNOSIS — F5104 Psychophysiologic insomnia: Secondary | ICD-10-CM

## 2024-05-27 DIAGNOSIS — E1165 Type 2 diabetes mellitus with hyperglycemia: Secondary | ICD-10-CM

## 2024-05-27 DIAGNOSIS — I252 Old myocardial infarction: Secondary | ICD-10-CM

## 2024-05-27 DIAGNOSIS — Z1211 Encounter for screening for malignant neoplasm of colon: Secondary | ICD-10-CM

## 2024-05-27 DIAGNOSIS — M79605 Pain in left leg: Secondary | ICD-10-CM

## 2024-05-27 DIAGNOSIS — Z5320 Procedure and treatment not carried out because of patient's decision for unspecified reasons: Secondary | ICD-10-CM

## 2024-05-27 DIAGNOSIS — Z130 Encounter for screening for diseases of the blood and blood-forming organs and certain disorders involving the immune mechanism: Secondary | ICD-10-CM

## 2024-05-27 DIAGNOSIS — C61 Malignant neoplasm of prostate: Secondary | ICD-10-CM

## 2024-05-27 DIAGNOSIS — R252 Cramp and spasm: Secondary | ICD-10-CM

## 2024-05-27 DIAGNOSIS — M87051 Idiopathic aseptic necrosis of right femur: Secondary | ICD-10-CM

## 2024-05-27 DIAGNOSIS — I10 Essential (primary) hypertension: Secondary | ICD-10-CM

## 2024-05-27 DIAGNOSIS — F429 Obsessive-compulsive disorder, unspecified: Secondary | ICD-10-CM

## 2024-06-02 ENCOUNTER — Other Ambulatory Visit: Payer: Self-pay | Admitting: Family Medicine

## 2024-06-09 ENCOUNTER — Encounter (INDEPENDENT_AMBULATORY_CARE_PROVIDER_SITE_OTHER)

## 2024-06-14 NOTE — Progress Notes (Unsigned)
 06/16/2024 10:42 AM   Gary Benson 1960/10/26 [id number]  Referring provider: Donzella Lauraine SAILOR, DO [address] [place],  [place] [address]  Urological history: 1.  High risk prostate cancer - PSA (03/2024) <0.1 - I PSA 33.8 - Gleason 4+3 on the right side. TRUS volume 28.6 - PET (08/2023) no evidence of metastasis - IMRT w/ brachy-boost (11/2023) - ADT through (10/2026)   2. Urgency - Vesicare  10 mg daily   Chief Complaint  Patient presents with   Prostate Cancer   HPI: Gary Benson is a 63 y.o. man who presents today for 6 week follow up after starting tamsulosin  0.4 mg daily for a mild dysuria.    Previous records reviewed.   Mild dysuria has abated with the tamsulosin  0.4 mg daily.  He has been having pain located right below the right gluteal fold.  This pain is aggravated by movement.  He states he can only bend to his waist and cannot touch his toes due to the pain.  He did receive an Eligard  injection in the right buttocks on July 1, there is no swelling or erythema noted in the right buttocks.  He pointed to where he remembered the injection was given and it was in the appropriate area (upper outer quadrant of the right buttock).  He does care for his wife at home and she is disabled with a history of leg amputation.  Patient denies any modifying or aggravating factors.  Patient denies any recent UTI's, gross hematuria, dysuria or suprapubic/flank pain.  Patient denies any fevers, chills, nausea or vomiting.    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    Prostate cancer (HCC)    Psychophysiologic insomnia    RLS (restless legs syndrome)    Stroke (HCC) 2015   Tobacco use     Surgical History: Past Surgical History:  Procedure Laterality Date   ANKLE FRACTURE SURGERY Right    CORONARY ANGIOPLASTY WITH STENT PLACEMENT     EYE SURGERY Bilateral    as a child   HERNIA REPAIR Right    RADIOACTIVE SEED IMPLANT N/A 11/24/2023   Procedure: RADIOACTIVE SEED IMPLANT/BRACHYTHERAPY IMPLANT (76 seeds);  Surgeon: Penne Knee, MD;  Location: [facility] ORS;  Service: Urology;  Laterality: N/A;    Home Medications:  Allergies as of 06/16/2024       Reactions   Bee Venom Anaphylaxis        Medication List        Accurate as of June 16, 2024 10:42 AM. If you have any questions, ask your nurse or doctor.          acetaminophen  500 MG tablet Commonly known as: TYLENOL  Take 1,000 mg by mouth every morning.   amoxicillin  875 MG tablet Commonly known as:  AMOXIL  Take 1 tablet (875 mg total) by mouth every 12 (twelve) hours.   aspirin  EC 81 MG tablet Take 81 mg by mouth daily. Swallow whole.   carvedilol  3.125 MG tablet Commonly known as: COREG  Take 1 tablet (3.125 mg total) by mouth 2 (two) times daily with a meal.   cyanocobalamin  1000 MCG tablet Commonly known as: VITAMIN B12 Take 1,000 mcg by mouth daily.   diclofenac  Sodium 1 % Gel Commonly known as: CVS Diclofenac  Sodium Apply 4 g topically 4 (four) times daily as needed.   EPINEPHrine  0.3 mg/0.3 mL Soaj injection Commonly known as: EPI-PEN Inject 0.3 mg into the muscle as needed for anaphylaxis.   famotidine 20 MG tablet Commonly known as: PEPCID Take 20 mg by mouth every morning.   gabapentin  300 MG capsule Commonly known as: NEURONTIN  TAKE 1 CAPSULE THREE TIMES DAILY   lisinopril  5 MG  tablet Commonly known as: ZESTRIL  Take 1 tablet (5 mg total) by mouth every morning.   meloxicam  7.5 MG tablet Commonly known as: MOBIC  TAKE 1 TABLET (7.5 MG TOTAL) BY MOUTH DAILY AS NEEDED FOR PAIN.   metFORMIN  500 MG 24 hr tablet Commonly known as: GLUCOPHAGE -XR Take 1 tablet (500 mg total) by mouth 2 (two) times daily with a meal.   mupirocin  ointment 2 % Commonly known as: BACTROBAN  Apply 1 Application topically 2 (two) times daily.   potassium chloride  10 MEQ tablet Commonly known as: KLOR-CON  TAKE 1 TABLET EVERY DAY   rosuvastatin  20 MG tablet Commonly known as: CRESTOR  TAKE 1 TABLET EVERY DAY   sertraline  100 MG tablet Commonly known as: ZOLOFT  Take 1 tablet (100 mg total) by mouth every morning.   Shingrix injection Generic drug: Zoster Vaccine Adjuvanted Inject 0.5 mLs into the muscle once.   solifenacin  10 MG tablet Commonly known as: VESICARE  Take 1 tablet (10 mg total) by mouth daily.   tamsulosin  0.4 MG Caps capsule Commonly known as: FLOMAX  Take 1 capsule (0.4 mg total) by mouth daily.   True Metrix Air Glucose Meter w/Device Kit USE AS DIRECTED   True Metrix Blood Glucose Test test strip Generic drug: glucose blood TEST BLOOD SUGAR IN THE MORNING, AT NOON, AND AT BEDTIME   TRUEdraw Lancing Device Misc USE AS DIRECTED   TRUEplus Lancets 33G Misc TEST BLOOD SUGAR IN THE MORNING, AT NOON, AND AT BEDTIME   Urogesic-Blue 81.6 MG Tabs Take 1 tablet (81.6 mg total) by mouth 4 (four) times daily as needed.   Vitamin D  (Ergocalciferol ) 1.25 MG (50000 UNIT) Caps capsule Commonly known as: DRISDOL  Take 1 capsule (50,000 Units total) by mouth every 7 (seven) days.        Allergies:  Allergies  Allergen Reactions   Bee Venom Anaphylaxis    Family History: Family History  Problem Relation Age of Onset   Diabetes Mother    Heart disease Father    Diabetes Sister     Social History: See HPI for pertinent social history  ROS: Pertinent  ROS in HPI  Physical Exam: BP 121/77 (BP Location: Left Arm, Patient Position: Sitting, Cuff Size: Normal)   Pulse 83   Ht 5' 9 (1.753 m)   Wt 166 lb (75.3 kg)   BMI 24.51 kg/m   Constitutional:  Well nourished. Alert and oriented, No acute distress. HEENT: [place] AT, moist mucus membranes.  Trachea midline Cardiovascular: No clubbing, cyanosis, or edema. Respiratory: Normal respiratory effort, no increased work of breathing. Neurologic: Grossly intact, no focal deficits, moving all 4  extremities. Psychiatric: Normal mood and affect.   Laboratory Data: See EPIC and HPI  I have reviewed the labs.   Pertinent Imaging: N/A  Assessment & Plan:    1. High risk prostate cancer - PSA is undetectable indicating excellent biochemical control - Continue to monitor every 6 months - return in January for next Eligard  injection - Advised patient to start taking calcium  1200 mg daily divided up in several doses with food and vitamin D  800 international units daily with a fatty meal to protect his bones  2. Urgency - continue Vesicare  to 10 mg daily - continue tamsulosin  0.4 mg daily   3. Right leg pain - may be secondary to pelvic ligament sprain/strain  - he does not want to be referred to PT - given pelvic relaxation exercises - encouraged to reach out to PCP  Return for return as scheduled for Eligard  in January .  These notes generated with voice recognition software. I apologize for typographical errors.  CLOTILDA HELON RIGGERS  [facility] [address]  [address] [place], [place] [address] [phone]

## 2024-06-16 ENCOUNTER — Encounter: Payer: Self-pay | Admitting: Urology

## 2024-06-16 ENCOUNTER — Ambulatory Visit: Admitting: Urology

## 2024-06-16 VITALS — BP 121/77 | HR 83 | Ht 69.0 in | Wt 166.0 lb

## 2024-06-16 DIAGNOSIS — C61 Malignant neoplasm of prostate: Secondary | ICD-10-CM | POA: Diagnosis not present

## 2024-06-16 DIAGNOSIS — R3915 Urgency of urination: Secondary | ICD-10-CM | POA: Diagnosis not present

## 2024-06-16 DIAGNOSIS — M79604 Pain in right leg: Secondary | ICD-10-CM | POA: Diagnosis not present

## 2024-06-16 LAB — URINALYSIS, COMPLETE
Bilirubin, UA: NEGATIVE
Glucose, UA: NEGATIVE
Ketones, UA: NEGATIVE
Leukocytes,UA: NEGATIVE
Nitrite, UA: NEGATIVE
Protein,UA: NEGATIVE
RBC, UA: NEGATIVE
Specific Gravity, UA: <1.005 — ABNORMAL LOW (ref 1.005–1.030)
Urobilinogen, Ur: 0.2 mg/dL (ref 0.2–1.0)
pH, UA: 6 (ref 5.0–7.5)

## 2024-06-16 LAB — MICROSCOPIC EXAMINATION
Bacteria, UA: NONE SEEN
RBC, Urine: NONE SEEN /HPF (ref 0–2)

## 2024-06-16 LAB — BLADDER SCAN AMB NON-IMAGING

## 2024-06-16 NOTE — Patient Instructions (Signed)
 Pelvic relaxation exercises focus on releasing tension and promoting flexibility in the pelvic floor muscles, which can be beneficial for various conditions. These exercises can be incorporated into daily routines to help manage symptoms like pelvic pain, urinary incontinence, and bowel dysfunction. Deep breathing, stretches like the butterfly stretch and adductor stretch, and specific poses like Happy Baby and Child's Pose are effective for pelvic floor relaxation.  Specific Exercises: Deep Breathing: . Focus on slow, deep breaths, allowing your belly and ribs to expand, which can help relax the pelvic floor.  Pelvic Tilts: . Lie on your back with knees bent and gently rock your pelvis forward and backward, promoting pelvic mobility.  Adductor Stretch: SABRA Lie on your back with soles of your feet together and knees falling open, creating a stretch in the inner thighs and pelvic floor.  Butterfly Stretch: SABRA Sit with the soles of your feet together and gently press your knees towards the floor, stretching the inner thighs and pelvic floor.  Happy Baby Pose: . Lie on your back, grab the outsides of your feet, and gently pull your knees towards your armpits, rocking side to side.  Child's Pose: . Kneel with your knees wide and sit back on your heels, extending your arms forward, allowing your torso to relax between your thighs.  Pelvic Floor Drops: . Focus on the sensation of the pelvic floor relaxing and releasing downwards.  Knee-to-Chest Stretch: SABRA Lie on your back, bring one knee towards your chest, and gently hold, feeling a stretch in the lower back and pelvis.  Yogi Squat: . Stand with feet wide, bend your knees, and lower your hips towards the ground, keeping your heels down, and gently push your knees open with your elbows.  Bridge Pose: . Lie on your back with knees bent and feet flat, lift your hips off the ground, engaging your pelvic floor muscles, and then lower down.

## 2024-06-21 ENCOUNTER — Encounter: Payer: Self-pay | Admitting: Family Medicine

## 2024-06-21 DIAGNOSIS — G8929 Other chronic pain: Secondary | ICD-10-CM

## 2024-06-23 MED ORDER — MELOXICAM 15 MG PO TABS: 15.0000 mg | ORAL_TABLET | Freq: Every day | ORAL | 3 refills | Status: DC

## 2024-06-28 ENCOUNTER — Other Ambulatory Visit: Payer: Self-pay | Admitting: Urology

## 2024-06-28 ENCOUNTER — Other Ambulatory Visit: Payer: Self-pay

## 2024-06-28 MED ORDER — TAMSULOSIN HCL 0.4 MG PO CAPS: 0.4000 mg | ORAL_CAPSULE | Freq: Every day | ORAL | 0 refills | Status: DC

## 2024-07-20 ENCOUNTER — Other Ambulatory Visit: Payer: Self-pay | Admitting: Urology

## 2024-07-27 ENCOUNTER — Encounter (INDEPENDENT_AMBULATORY_CARE_PROVIDER_SITE_OTHER)

## 2024-08-10 ENCOUNTER — Encounter: Payer: Self-pay | Admitting: Family Medicine

## 2024-09-01 ENCOUNTER — Encounter: Payer: Self-pay | Admitting: Family Medicine

## 2024-09-01 ENCOUNTER — Ambulatory Visit: Admitting: Emergency Medicine

## 2024-09-01 ENCOUNTER — Ambulatory Visit: Admitting: Family Medicine

## 2024-09-01 VITALS — BP 95/60 | HR 86 | Temp 97.8°F | Ht 69.0 in | Wt 161.5 lb

## 2024-09-01 VITALS — Ht 69.0 in | Wt 162.0 lb

## 2024-09-01 DIAGNOSIS — E1169 Type 2 diabetes mellitus with other specified complication: Secondary | ICD-10-CM

## 2024-09-01 DIAGNOSIS — G8929 Other chronic pain: Secondary | ICD-10-CM | POA: Diagnosis not present

## 2024-09-01 DIAGNOSIS — E785 Hyperlipidemia, unspecified: Secondary | ICD-10-CM | POA: Diagnosis not present

## 2024-09-01 DIAGNOSIS — F17200 Nicotine dependence, unspecified, uncomplicated: Secondary | ICD-10-CM | POA: Diagnosis not present

## 2024-09-01 DIAGNOSIS — Z0000 Encounter for general adult medical examination without abnormal findings: Secondary | ICD-10-CM

## 2024-09-01 DIAGNOSIS — E1165 Type 2 diabetes mellitus with hyperglycemia: Secondary | ICD-10-CM | POA: Diagnosis not present

## 2024-09-01 DIAGNOSIS — Z79899 Other long term (current) drug therapy: Secondary | ICD-10-CM

## 2024-09-01 DIAGNOSIS — E1159 Type 2 diabetes mellitus with other circulatory complications: Secondary | ICD-10-CM

## 2024-09-01 DIAGNOSIS — I152 Hypertension secondary to endocrine disorders: Secondary | ICD-10-CM | POA: Diagnosis not present

## 2024-09-01 DIAGNOSIS — Z23 Encounter for immunization: Secondary | ICD-10-CM | POA: Diagnosis not present

## 2024-09-01 DIAGNOSIS — Z7984 Long term (current) use of oral hypoglycemic drugs: Secondary | ICD-10-CM | POA: Diagnosis not present

## 2024-09-01 DIAGNOSIS — Z716 Tobacco abuse counseling: Secondary | ICD-10-CM | POA: Diagnosis not present

## 2024-09-01 DIAGNOSIS — Z8673 Personal history of transient ischemic attack (TIA), and cerebral infarction without residual deficits: Secondary | ICD-10-CM

## 2024-09-01 NOTE — Progress Notes (Signed)
 "    Complete physical exam   Patient: Gary Benson   DOB: 08-Aug-1961   63 y.o. Male  MRN: [id number] Visit Date: 09/01/2024  Today's healthcare provider: LAURAINE LOISE BUOY, DO   Chief Complaint  Patient presents with   Annual Exam    Diet- General Exercise- Walk a little bit Overall feeling- Okay, soreness on right hip Sleep- Good Concerns- None  Lung Cancer Screening- declined  Flu Vaccine- yes   Subjective    Gary Benson is a 63 y.o. male who presents today for a complete physical exam.   HPI HPI     Annual Exam    Additional comments: Diet- General Exercise- Walk a little bit Overall feeling- Okay, soreness on right hip Sleep- Good Concerns- None  Lung Cancer Screening- declined  Flu Vaccine- yes      Last edited by Terrel Powell CROME, CMA on 09/01/2024  2:47 PM.      Gary Benson is a 63 year old male who presents for an annual physical exam.  He has diabetes with stable blood sugars ranging from the seventies to a hundred, occasionally reaching up to one hundred and thirty. He takes metformin , one tablet in the morning, and does not follow any dietary carbohydrate restrictions. He is not on any injectable medications.  He experiences pain in the right buttock area, alleviated by meloxicam  15 mg taken at night. The pain does not radiate but causes soreness in the back of his legs when standing for prolonged periods. He uses a roll-on topical treatment for his legs and associates the pain with hormone injections received in the same area. He reports difficulty with stairs, requiring extra effort from his right leg, and uses a cane for long distances. No falls, dizziness, or lightheadedness. He experiences occasional calf pain at night.  He smokes approximately one pack of cigarettes per day and has no interest in quitting at this time. He has a history of eye surgery for a cyst and wears glasses.  His blood pressure is usually in the one  twenties over seventies range.     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    Prostate cancer (HCC)    Psychophysiologic insomnia    RLS (restless legs syndrome)    Stroke (HCC) 2015   Tobacco use    Past Surgical History:  Procedure Laterality Date   ANKLE FRACTURE SURGERY Right    CORONARY ANGIOPLASTY WITH STENT PLACEMENT     EYE SURGERY Bilateral    as a child   HERNIA REPAIR Right    RADIOACTIVE SEED IMPLANT N/A 11/24/2023   Procedure: RADIOACTIVE SEED IMPLANT/BRACHYTHERAPY IMPLANT (76 seeds);  Surgeon: Penne Knee, MD;  Location: [facility] ORS;  Service: Urology;  Laterality: N/A;   Social History   Socioeconomic History   Marital status: Married  Spouse name: Clarita   Number of children: 1   Years of education: Not on file   Highest education level: Not on file  Occupational History   Occupation: disability  Tobacco Use   Smoking status: Every Day    Current packs/day: 1.00    Average packs/day: 1 pack/day for 41.1 years (41.1 ttl pk-yrs)    Types: Cigarettes    Start date: 08/25/1983    Passive exposure: Current   Smokeless tobacco: Never  Vaping Use   Vaping status: Never Used  Substance and Sexual Activity   Alcohol use: No   Drug use: Never   Sexual activity: Not on file  Other Topics Concern   Not on file  Social History Narrative   09/01/24 1 biological son and 3 step-daughters/pbt   Social Drivers of Health   Tobacco Use: High  Risk (09/01/2024)   Patient History    Smoking Tobacco Use: Every Day    Smokeless Tobacco Use: Never    Passive Exposure: Current  Financial Resource Strain: Low Risk (04/02/2024)   Overall Financial Resource Strain (CARDIA)    Difficulty of Paying Living Expenses: Not hard at all  Food Insecurity: No Food Insecurity (09/01/2024)   Epic    Worried About Radiation Protection Practitioner of Food in the Last Year: Never true    Ran Out of Food in the Last Year: Never true  Transportation Needs: No Transportation Needs (09/01/2024)   Epic    Lack of Transportation (Medical): No    Lack of Transportation (Non-Medical): No  Physical Activity: Insufficiently Active (09/01/2024)   Exercise Vital Sign    Days of Exercise per Week: 2 days    Minutes of Exercise per Session: 20 min  Stress: No Stress Concern Present (09/01/2024)   [employer] of Occupational Health - Occupational Stress Questionnaire    Feeling of Stress: Not at all  Social Connections: Moderately Isolated (09/01/2024)   Social Connection and Isolation Panel    Frequency of Communication with Friends and Family: More than three times a week    Frequency of Social Gatherings with Friends and Family: Once a week    Attends Religious Services: Never    Database Administrator or Organizations: No    Attends Banker Meetings: Never    Marital Status: Married  Catering Manager Violence: Not At Risk (09/01/2024)   Epic    Fear of Current or Ex-Partner: No    Emotionally Abused: No    Physically Abused: No    Sexually Abused: No  Depression (PHQ2-9): Low Risk (09/01/2024)   Depression (PHQ2-9)    PHQ-2 Score: 0  Alcohol Screen: Low Risk (04/02/2024)   Alcohol Screen    Last Alcohol Screening Score (AUDIT): 0  Housing: Low Risk (09/01/2024)   Epic    Unable to Pay for Housing in the Last Year: No    Number of Times Moved in the Last Year: 0    Homeless in the Last Year: No  Utilities: Not At Risk (09/01/2024)   Epic     Threatened with loss of utilities: No  Health Literacy: Adequate Health Literacy (09/01/2024)   B1300 Health Literacy    Frequency of need for help with medical instructions: Never   Family Status  Relation Name Status   Mother  Alive   Father  Deceased   Sister  (Not Specified)  No partnership data on file   Family History  Problem Relation Age of Onset   Diabetes Mother  Heart disease Father    Diabetes Sister    Allergies  Allergen Reactions   Bee Venom Anaphylaxis    Patient Care Team: Yashar Inclan, Lauraine SAILOR, DO as PCP - General (Family Medicine) Pa, [facility], Clotilda DELENA RIGGERS as Physician Assistant (Urology) Lenn Aran, MD as Consulting Physician (Radiation Oncology)   Medications: Outpatient Medications Prior to Visit  Medication Sig   acetaminophen  (TYLENOL ) 500 MG tablet Take 1,000 mg by mouth every morning.   aspirin  EC 81 MG tablet Take 81 mg by mouth daily. Swallow whole.   Blood Glucose Monitoring Suppl (TRUE METRIX AIR GLUCOSE METER) w/Device KIT USE AS DIRECTED   carvedilol  (COREG ) 3.125 MG tablet Take 1 tablet (3.125 mg total) by mouth 2 (two) times daily with a meal.   cyanocobalamin  (VITAMIN B12) 1000 MCG tablet Take 1,000 mcg by mouth daily.   EPINEPHrine  0.3 mg/0.3 mL IJ SOAJ injection Inject 0.3 mg into the muscle as needed for anaphylaxis.   famotidine (PEPCID) 20 MG tablet Take 20 mg by mouth every morning.   gabapentin  (NEURONTIN ) 300 MG capsule TAKE 1 CAPSULE THREE TIMES DAILY   glucose blood (TRUE METRIX BLOOD GLUCOSE TEST) test strip TEST BLOOD SUGAR IN THE MORNING, AT NOON, AND AT BEDTIME   Lancet Devices (TRUEDRAW LANCING DEVICE) MISC USE AS DIRECTED   lisinopril  (ZESTRIL ) 5 MG tablet Take 1 tablet (5 mg total) by mouth every morning.   metFORMIN  (GLUCOPHAGE -XR) 500 MG 24 hr tablet Take 1 tablet (500 mg total) by mouth 2 (two) times daily with a meal.   potassium chloride  (KLOR-CON ) 10 MEQ tablet TAKE 1 TABLET EVERY DAY    sertraline  (ZOLOFT ) 100 MG tablet Take 1 tablet (100 mg total) by mouth every morning.   solifenacin  (VESICARE ) 10 MG tablet Take 1 tablet (10 mg total) by mouth daily.   tamsulosin  (FLOMAX ) 0.4 MG CAPS capsule TAKE 1 CAPSULE EVERY DAY   TRUEplus Lancets 33G MISC TEST BLOOD SUGAR IN THE MORNING, AT NOON, AND AT BEDTIME   [DISCONTINUED] meloxicam  (MOBIC ) 15 MG tablet Take 1 tablet (15 mg total) by mouth daily.   [DISCONTINUED] rosuvastatin  (CRESTOR ) 20 MG tablet TAKE 1 TABLET EVERY DAY   [DISCONTINUED] Vitamin D , Ergocalciferol , (DRISDOL ) 1.25 MG (50000 UNIT) CAPS capsule Take 1 capsule (50,000 Units total) by mouth every 7 (seven) days.   [DISCONTINUED] amoxicillin  (AMOXIL ) 875 MG tablet Take 1 tablet (875 mg total) by mouth every 12 (twelve) hours. (Patient not taking: Reported on 09/01/2024)   [DISCONTINUED] diclofenac  Sodium ([employer]S DICLOFENAC  SODIUM) 1 % GEL Apply 4 g topically 4 (four) times daily as needed. (Patient not taking: Reported on 09/01/2024)   [DISCONTINUED] Methen-Hyosc-Meth Blue-Na Phos (UROGESIC-BLUE) 81.6 MG TABS Take 1 tablet (81.6 mg total) by mouth 4 (four) times daily as needed. (Patient not taking: Reported on 09/01/2024)   [DISCONTINUED] mupirocin  ointment (BACTROBAN ) 2 % Apply 1 Application topically 2 (two) times daily. (Patient taking differently: Apply 1 Application topically 2 (two) times daily. PRN)   [DISCONTINUED] SHINGRIX injection Inject 0.5 mLs into the muscle once. (Patient not taking: Reported on 09/01/2024)   No facility-administered medications prior to visit.    Review of Systems  Constitutional:  Negative for appetite change, chills, fatigue and fever.  HENT:  Negative for congestion, ear pain, hearing loss, nosebleeds and trouble swallowing.   Eyes:  Negative for pain and visual disturbance.  Respiratory:  Negative for cough, chest tightness and shortness of breath.   Cardiovascular:  Negative for chest pain, palpitations  and leg swelling.   Gastrointestinal:  Negative for abdominal pain, blood in stool, constipation, diarrhea, nausea and vomiting.  Endocrine: Negative for polydipsia, polyphagia and polyuria.  Genitourinary:  Negative for dysuria and flank pain.  Musculoskeletal:  Positive for arthralgias (right low buttock). Negative for back pain, joint swelling, myalgias and neck stiffness.  Skin:  Negative for color change, rash and wound.  Neurological:  Negative for dizziness, tremors, seizures, speech difficulty, weakness, light-headedness and headaches.  Psychiatric/Behavioral:  Negative for behavioral problems, confusion, decreased concentration, dysphoric mood and sleep disturbance. The patient is not nervous/anxious.   All other systems reviewed and are negative.     Objective    BP 95/60 (BP Location: Left Arm, Patient Position: Sitting, Cuff Size: Normal)   Pulse 86   Temp 97.8 F (36.6 C) (Oral)   Ht 5' 9 (1.753 m)   Wt 161 lb 8 oz (73.3 kg)   SpO2 100%   BMI 23.85 kg/m    Physical Exam Vitals and nursing note reviewed.  Constitutional:      General: He is awake.     Appearance: Normal appearance.  HENT:     Head: Normocephalic and atraumatic.     Right Ear: Tympanic membrane, ear canal and external ear normal.     Left Ear: Tympanic membrane, ear canal and external ear normal.     Nose: Nose normal.     Mouth/Throat:     Mouth: Mucous membranes are moist.     Pharynx: Oropharynx is clear. No oropharyngeal exudate or posterior oropharyngeal erythema.  Eyes:     General: No scleral icterus.    Extraocular Movements: Extraocular movements intact.     Conjunctiva/sclera: Conjunctivae normal.     Pupils: Pupils are equal, round, and reactive to light.  Neck:     Thyroid : No thyromegaly or thyroid  tenderness.  Cardiovascular:     Rate and Rhythm: Normal rate and regular rhythm.     Pulses: Normal pulses.     Heart sounds: Normal heart sounds.  Pulmonary:     Effort: Pulmonary effort is normal.  No tachypnea, bradypnea or respiratory distress.     Breath sounds: Normal breath sounds. No stridor. No wheezing, rhonchi or rales.  Abdominal:     General: Bowel sounds are normal. There is no distension.     Palpations: Abdomen is soft. There is no mass.     Tenderness: There is no abdominal tenderness. There is no guarding.     Hernia: No hernia is present.  Musculoskeletal:     Cervical back: Normal range of motion and neck supple.     Right lower leg: No edema.     Left lower leg: No edema.  Lymphadenopathy:     Cervical: No cervical adenopathy.  Skin:    General: Skin is warm and dry.  Neurological:     Mental Status: He is alert and oriented to person, place, and time. Mental status is at baseline.  Psychiatric:        Mood and Affect: Mood normal.        Behavior: Behavior normal.      Last depression screening scores    09/01/2024    1:26 PM 04/02/2024    1:16 PM 08/25/2023   11:15 AM  PHQ 2/9 Scores  PHQ - 2 Score 0 0 0  PHQ- 9 Score 0 0  0      Data saved with a previous flowsheet row definition   Last fall risk screening  09/01/2024    1:17 PM  Fall Risk   Falls in the past year? 0  Number falls in past yr: 0  Injury with Fall? 0   Risk for fall due to : Impaired balance/gait;Impaired mobility  Follow up Falls evaluation completed;Education provided;Falls prevention discussed     Data saved with a previous flowsheet row definition   Last Audit-C alcohol use screening    04/02/2024    1:14 PM  Alcohol Use Disorder Test (AUDIT)  1. How often do you have a drink containing alcohol? 0  2. How many drinks containing alcohol do you have on a typical day when you are drinking? 0  3. How often do you have six or more drinks on one occasion? 0  AUDIT-C Score 0   A score of 3 or more in women, and 4 or more in men indicates increased risk for alcohol abuse, EXCEPT if all of the points are from question 1   Results for orders placed or performed in visit  on 09/01/24  Comprehensive metabolic panel with GFR  Result Value Ref Range   Glucose 92 70 - 99 mg/dL   BUN 8 8 - 27 mg/dL   Creatinine, Ser 9.00 0.76 - 1.27 mg/dL   eGFR 86 >40 fO/fpw/8.26   BUN/Creatinine Ratio 8 (L) 10 - 24   Sodium 135 134 - 144 mmol/L   Potassium 4.3 3.5 - 5.2 mmol/L   Chloride 102 96 - 106 mmol/L   CO2 21 20 - 29 mmol/L   Calcium  9.4 8.6 - 10.2 mg/dL   Total Protein 6.5 6.0 - 8.5 g/dL   Albumin 4.1 3.9 - 4.9 g/dL   Globulin, Total 2.4 1.5 - 4.5 g/dL   Bilirubin Total <9.7 0.0 - 1.2 mg/dL   Alkaline Phosphatase 63 47 - 123 IU/L   AST 15 0 - 40 IU/L   ALT 10 0 - 44 IU/L  Vitamin D  (25 hydroxy)  Result Value Ref Range   Vit D, 25-Hydroxy 53.3 30.0 - 100.0 ng/mL  B12  Result Value Ref Range   Vitamin B-12 513 232 - 1,245 pg/mL  Hemoglobin A1c  Result Value Ref Range   Hgb A1c MFr Bld 6.2 (H) 4.8 - 5.6 %   Est. average glucose Bld gHb Est-mCnc 131 mg/dL  Lipid Panel With LDL/HDL Ratio  Result Value Ref Range   Cholesterol, Total 101 100 - 199 mg/dL   Triglycerides 823 (H) 0 - 149 mg/dL   HDL 33 (L) >60 mg/dL   VLDL Cholesterol Cal 29 5 - 40 mg/dL   LDL Chol Calc (NIH) 39 0 - 99 mg/dL   LDL/HDL Ratio 1.2 0.0 - 3.6 ratio    Assessment & Plan    Routine Health Maintenance and Physical Exam  Exercise Activities and Dietary recommendations  Goals       Medication Adherence Maintained      Evidence-based guidance:  Develop a complete and accurate medication list including those prescribed and over-the-counter, those taken only occasionally and those not taken by mouth such as injections, inhalers, ointments or creams and drops.  Review all medications to determine if patient or caregiver knows why the medications are given and if taken as prescribed.  Complete or review a medication adherence assessment including barriers to medication adherence.  Arrange and encourage counseling and medication review by pharmacist.  Assess barriers to medication  adherence.  Manage poor understanding or health literacy by using easy to understand language, teach-back, visual aids and  teaching only 2 or 3 points at a time.  Assess presence of side effects; provide suggestions to manage or reduce side effects.  Consult with provider and/or pharmacist regarding substitute medication, changing dose, simplification of regimen or safe discontinuation of some medications.  Encourage the use of medication reminders such as clock or cell phone alarm, color coding, pillboxes for am/pm and days of the week, pharmacy refill reminder, auto-refill system or mail-order option.  Assist with resources when cost is a barrier; refer to prescription assistance programs; confirm that generics are prescribed whenever possible; consider 90-day prescriptions to reduce copay cost; synchronize refills.  Provide help to complete medication assistance applications or health insurance forms as needed.  Complete a follow-up call 2 to 3 weeks after medication self-management plan developed; assess adherence and understanding, as well as listen to patient or caregiver concerns; amend plan as needed.  Provide frequent follow-up providing motivation, encouragement and support when medication nonadherence is identified.   Notes:       work on balance (pt-stated)        Immunization History  Administered Date(s) Administered   Influenza, Seasonal, Injecte, Preservative Fre 07/28/2023, 09/01/2024   Influenza,inj,Quad PF,6+ Mos 09/22/2016   Janssen (J&J) SARS-COV-2 Vaccination 02/11/2020   Moderna Covid-19 Fall Seasonal Vaccine 41yrs & older 09/05/2023   PNEUMOCOCCAL CONJUGATE-20 12/05/2023   Pneumococcal Polysaccharide-23 09/22/2016   Tdap 07/07/2015   Zoster Recombinant(Shingrix) 09/05/2023, 12/15/2023    Health Maintenance  Topic Date Due   COVID-19 Vaccine (3 - 2025-26 season) 10/07/2024 (Originally 06/07/2024)   Lung Cancer Screening  09/01/2025 (Originally 12/22/2013)    OPHTHALMOLOGY EXAM  02/08/2025   HEMOGLOBIN A1C  03/01/2025   Diabetic kidney evaluation - Urine ACR  04/02/2025   DTaP/Tdap/Td (2 - Td or Tdap) 07/06/2025   Diabetic kidney evaluation - eGFR measurement  09/01/2025   FOOT EXAM  09/01/2025   Medicare Annual Wellness (AWV)  09/01/2025   Fecal DNA (Cologuard)  04/09/2027   Pneumococcal Vaccine: 50+ Years  Completed   Influenza Vaccine  Completed   Zoster Vaccines- Shingrix  Completed   Hepatitis B Vaccines 19-59 Average Risk  Aged Out   HPV VACCINES  Aged Out   Meningococcal B Vaccine  Aged Out   Hepatitis C Screening  Discontinued   HIV Screening  Discontinued    Discussed health benefits of physical activity, and encouraged him to engage in regular exercise appropriate for his age and condition.   Annual physical exam  Need for influenza vaccination -     Flu vaccine trivalent PF, 6mos and older(Flulaval,Afluria,Fluarix,Fluzone)  Hypertension associated with diabetes (HCC) -     Comprehensive metabolic panel with GFR  Type 2 diabetes mellitus with hyperglycemia, without long-term current use of insulin (HCC) -     VITAMIN D  25 Hydroxy (Vit-D Deficiency, Fractures) -     Vitamin B12 -     Hemoglobin A1c  High risk medication use -     Vitamin B12  Hyperlipidemia associated with type 2 diabetes mellitus (HCC) -     Lipid Panel With LDL/HDL Ratio  History of CVA (cerebrovascular accident)  Nicotine  dependence with current use  Encounter for smoking cessation counseling  Other chronic pain     Annual physical exam Routine visit with no acute concerns. Physical exam overall unremarkable except as noted above. Routine lab work ordered as noted. Asymptomatic low blood pressure. No interest in lung cancer screening or COVID booster. - Ordered blood work including cholesterol panel. - Performed foot exam. -  Administered flu shot.  Hypertension associated with diabetes; history of CVA (cerebrovascular  accident) Chronic, stable.  Managed with carvedilol  3.125 mg twice daily and lisinopril  5 mg daily.  No changes today.  Type 2 diabetes mellitus with hyperglycemia, without long-term current use of insulin; high risk medications Well-controlled with metformin . Blood sugars range from 70s to 100s. - Continue metformin  as prescribed. - Check vitamin B12 level today due to long-term use of metformin .  Hyperlipidemia associated with type 2 diabetes mellitus Chronic, managed with rosuvastatin  20 mg daily.  Will recheck lipid panel today.  Other chronic pain Chronic pain in right buttock/hip and leg, managed with meloxicam , possibly related to hormone injections. No interest in physical therapy. - Continue meloxicam  15 mg at night. - Consider alternative injection sites for hormone shots.  Nicotine  dependence with current use; encounter for smoking cessation counseling Smokes approximately one pack per day. No interest in quitting.  Risks of continued smoking.  Patient prefers to defer consideration of quitting until after his prostate cancer is resolved.  Vitamin D  deficiency Currently on high-dose vitamin D  supplementation. Plan to recheck level today. - Rechecked vitamin D  levels. - Will adjust vitamin D  supplementation based on results.    Return in about 6 months (around 03/01/2025), or for [facility] w/Dr. Franchot.     I discussed the assessment and treatment plan with the patient  The patient was provided an opportunity to ask questions and all were answered. The patient agreed with the plan and demonstrated an understanding of the instructions.   The patient was advised to call back or seek an in-person evaluation if the symptoms worsen or if the condition fails to improve as anticipated.    LAURAINE LOISE BUOY, DO  [facility] [phone] (phone) [phone] (fax)  [facility] "

## 2024-09-01 NOTE — Patient Instructions (Addendum)
 Gary Benson,  Thank you for taking the time for your Medicare Wellness Visit. I appreciate your continued commitment to your health goals. Please review the care plan we discussed, and feel free to reach out if I can assist you further.  Please note that Annual Wellness Visits do not include a physical exam. Some assessments may be limited, especially if the visit was conducted virtually. If needed, we may recommend an in-person follow-up with your provider.  Ongoing Care Seeing your primary care provider every 3 to 6 months helps us  monitor your health and provide consistent, personalized care.   Referrals If a referral was made during today's visit and you haven't received any updates within two weeks, please contact the referred provider directly to check on the status.  Recommended Screenings:  Get a flu vaccine at your OV on 09/01/24.   Health Maintenance  Topic Date Due   Screening for Lung Cancer  12/22/2013   Flu Shot  05/07/2024   Complete foot exam   05/21/2024   COVID-19 Vaccine (3 - 2025-26 season) 06/07/2024   Medicare Annual Wellness Visit  07/27/2024   Hemoglobin A1C  10/02/2024   Eye exam for diabetics  02/08/2025   Yearly kidney function blood test for diabetes  04/02/2025   Yearly kidney health urinalysis for diabetes  04/02/2025   DTaP/Tdap/Td vaccine (2 - Td or Tdap) 07/06/2025   Cologuard (Stool DNA test)  04/09/2027   Pneumococcal Vaccine for age over 50  Completed   Zoster (Shingles) Vaccine  Completed   Hepatitis B Vaccine  Aged Out   HPV Vaccine  Aged Out   Meningitis B Vaccine  Aged Out   Hepatitis C Screening  Discontinued   HIV Screening  Discontinued       09/01/2024    1:17 PM  Advanced Directives  Does Patient Have a Medical Advance Directive? No  Would patient like information on creating a medical advance directive? Yes (MAU/Ambulatory/Procedural Areas - Information given)  Information on Advanced Care Planning can be found at [place]   Secretary of [facility] Advance Health Care Directives Advance Health Care Directives ([email or web address]  You may also get the forms at your doctor's office.  Vision: Annual vision screenings are recommended for early detection of glaucoma, cataracts, and diabetic retinopathy. These exams can also reveal signs of chronic conditions such as diabetes and high blood pressure.  Dental: Annual dental screenings help detect early signs of oral cancer, gum disease, and other conditions linked to overall health, including heart disease and diabetes.  Please see the attached documents for additional preventive care recommendations.

## 2024-09-01 NOTE — Progress Notes (Signed)
 Chief Complaint  Patient presents with   Medicare Wellness     Subjective:   Gary Benson is a 64 y.o. male who presents for a Medicare Annual Wellness Visit.  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    Prostate cancer (HCC)    Psychophysiologic insomnia    RLS (restless legs syndrome)    Stroke (HCC) 2015   Tobacco use    Past Surgical History:  Procedure Laterality Date   ANKLE FRACTURE SURGERY Right    CORONARY ANGIOPLASTY WITH STENT PLACEMENT     EYE SURGERY Bilateral    as a child   HERNIA REPAIR Right    RADIOACTIVE SEED IMPLANT N/A 11/24/2023   Procedure: RADIOACTIVE SEED IMPLANT/BRACHYTHERAPY IMPLANT (76 seeds);  Surgeon: Penne Knee, MD;  Location: [facility] ORS;  Service: Urology;  Laterality: N/A;   Family History  Problem Relation Age of Onset   Diabetes Mother    Heart disease Father    Diabetes Sister    Social History   Occupational History   Occupation: disability  Tobacco Use   Smoking status: Every Day    Current packs/day: 1.00    Average packs/day: 1 pack/day for 41.0 years (41.0 ttl pk-yrs)    Types: Cigarettes    Start date: 08/25/1983    Passive exposure: Current   Smokeless tobacco: Never  Vaping Use   Vaping status: Never Used  Substance and Sexual Activity   Alcohol use: No   Drug use: Never   Sexual activity: Not on file   Tobacco Counseling Ready to quit: Not  Answered Counseling given: Not Answered  SDOH Screenings   Food Insecurity: No Food Insecurity (09/01/2024)  Housing: Low Risk  (09/01/2024)  Transportation Needs: No Transportation Needs (09/01/2024)  Utilities: Not At Risk (09/01/2024)  Alcohol Screen: Low Risk  (04/02/2024)  Depression (PHQ2-9): Low Risk  (09/01/2024)  Financial Resource Strain: Low Risk  (04/02/2024)  Physical Activity: Insufficiently Active (09/01/2024)  Social Connections: Moderately Isolated (09/01/2024)  Stress: No Stress Concern Present (09/01/2024)  Tobacco Use: High Risk (09/01/2024)  Health Literacy: Adequate Health Literacy (09/01/2024)   See flowsheets for full screening details  Depression Screen PHQ 2 & 9 Depression Scale- Over the past 2 weeks, how often have you been bothered by any of the following problems? Little interest or pleasure in doing things: 0 Feeling down, depressed, or hopeless (PHQ Adolescent also includes...irritable): 0 PHQ-2 Total Score: 0 Trouble falling or staying asleep, or sleeping too much: 0 Feeling tired or having little energy: 0 Poor appetite or overeating (PHQ Adolescent also includes...weight loss): 0 Feeling bad about yourself - or that you are a failure or have let yourself or your family down: 0 Trouble concentrating on things, such as reading the newspaper or watching television (PHQ Adolescent also includes...like school work): 0 Moving  or speaking so slowly that other people could have noticed. Or the opposite - being so fidgety or restless that you have been moving around a lot more than usual: 0 Thoughts that you would be better off dead, or of hurting yourself in some way: 0 PHQ-9 Total Score: 0 If you checked off any problems, how difficult have these problems made it for you to do your work, take care of things at home, or get along with other people?: Not difficult at all  Depression Treatment Depression Interventions/Treatment : EYV7-0 Score <4 Follow-up  Not Indicated; Currently on Treatment     Goals Addressed               This Visit's Progress     work on balance (pt-stated)         Visit info / Clinical Intake: Medicare Wellness Visit Type:: Initial Annual Wellness Visit Persons participating in visit:: patient Medicare Wellness Visit Mode:: Telephone If telephone:: video declined Because this visit was a virtual/telehealth visit:: pt reported vitals If Telephone or Video please confirm:: I connected with the patient using audio enabled telemedicine application and verified that I am speaking with the correct person using two identifiers; I discussed the limitations of evaluation and management by telemedicine; The patient expressed understanding and agreed to proceed Patient Location:: home Provider Location:: home office Information given by:: patient Interpreter Needed?: No Pre-visit prep was completed: yes AWV questionnaire completed by patient prior to visit?: no Living arrangements:: lives with spouse/significant other Patient's Overall Health Status Rating: good Typical amount of pain: some Does pain affect daily life?: no Are you currently prescribed opioids?: no  Dietary Habits and Nutritional Risks How many meals a day?: 3 Eats fruit and vegetables daily?: yes Most meals are obtained by: having others provide food; preparing own meals (Meals on Wheels) In the last 2 weeks, have you had any of the following?: none Diabetic:: (!) yes Any non-healing wounds?: no How often do you check your BS?: 2 (FBS 100 per patient) Would you like to be referred to a Nutritionist or for Diabetic Management? : no  Functional Status Activities of Daily Living (to include ambulation/medication): Independent Ambulation: Independent with device- listed below Home Assistive Devices/Equipment: Cane; Eyeglasses (uses cane prn if walking for long distances) Medication Administration: Independent Home Management:  Independent Manage your own finances?: yes Primary transportation is: driving Concerns about vision?: no *vision screening is required for WTM* Concerns about hearing?: no  Fall Screening Falls in the past year?: 0 Number of falls in past year: 0 Was there an injury with Fall?: 0 Fall Risk Category Calculator: 0 Patient Fall Risk Level: Low Fall Risk  Fall Risk Patient at Risk for Falls Due to: Impaired balance/gait; Impaired mobility Fall risk Follow up: Falls evaluation completed; Education provided; Falls prevention discussed  Home and Transportation Safety: All rugs have non-skid backing?: yes All stairs or steps have railings?: yes (also has a ramp) Grab bars in the bathtub or shower?: (!) no Have non-skid surface in bathtub or shower?: yes Good home lighting?: yes Regular seat belt use?: yes Hospital stays in the last year:: no  Cognitive Assessment Difficulty concentrating, remembering, or making decisions? : no Will 6CIT or Mini Cog be Completed: yes What year is it?: 0 points What month is it?: 0 points Give patient an address phrase to remember (5 components): [address] [place] About what time is it?: 0 points Count backwards from 20 to 1: 0 points Say the  months of the year in reverse: 0 points Repeat the address phrase from earlier: 4 points 6 CIT Score: 4 points  Advance Directives (For Healthcare) Does Patient Have a Medical Advance Directive?: No Would patient like information on creating a medical advance directive?: Yes (MAU/Ambulatory/Procedural Areas - Information given)  Reviewed/Updated  Reviewed/Updated: Reviewed All (Medical, Surgical, Family, Medications, Allergies, Care Teams, Patient Goals)        Objective:    Today's Vitals   09/01/24 1311  Weight: 162 lb (73.5 kg)  Height: 5' 9 (1.753 m)   Body mass index is 23.92 kg/m.  Current Medications (verified) Outpatient Encounter Medications as of 09/01/2024  Medication Sig    acetaminophen  (TYLENOL ) 500 MG tablet Take 1,000 mg by mouth every morning.   aspirin  EC 81 MG tablet Take 81 mg by mouth daily. Swallow whole.   Blood Glucose Monitoring Suppl (TRUE METRIX AIR GLUCOSE METER) w/Device KIT USE AS DIRECTED   carvedilol  (COREG ) 3.125 MG tablet Take 1 tablet (3.125 mg total) by mouth 2 (two) times daily with a meal.   cyanocobalamin  (VITAMIN B12) 1000 MCG tablet Take 1,000 mcg by mouth daily.   EPINEPHrine  0.3 mg/0.3 mL IJ SOAJ injection Inject 0.3 mg into the muscle as needed for anaphylaxis.   famotidine (PEPCID) 20 MG tablet Take 20 mg by mouth every morning.   gabapentin  (NEURONTIN ) 300 MG capsule TAKE 1 CAPSULE THREE TIMES DAILY   glucose blood (TRUE METRIX BLOOD GLUCOSE TEST) test strip TEST BLOOD SUGAR IN THE MORNING, AT NOON, AND AT BEDTIME   Lancet Devices (TRUEDRAW LANCING DEVICE) MISC USE AS DIRECTED   lisinopril  (ZESTRIL ) 5 MG tablet Take 1 tablet (5 mg total) by mouth every morning.   meloxicam  (MOBIC ) 15 MG tablet Take 1 tablet (15 mg total) by mouth daily.   metFORMIN  (GLUCOPHAGE -XR) 500 MG 24 hr tablet Take 1 tablet (500 mg total) by mouth 2 (two) times daily with a meal.   mupirocin  ointment (BACTROBAN ) 2 % Apply 1 Application topically 2 (two) times daily. (Patient taking differently: Apply 1 Application topically 2 (two) times daily. PRN)   potassium chloride  (KLOR-CON ) 10 MEQ tablet TAKE 1 TABLET EVERY DAY   rosuvastatin  (CRESTOR ) 20 MG tablet TAKE 1 TABLET EVERY DAY   sertraline  (ZOLOFT ) 100 MG tablet Take 1 tablet (100 mg total) by mouth every morning.   solifenacin  (VESICARE ) 10 MG tablet Take 1 tablet (10 mg total) by mouth daily.   tamsulosin  (FLOMAX ) 0.4 MG CAPS capsule TAKE 1 CAPSULE EVERY DAY   TRUEplus Lancets 33G MISC TEST BLOOD SUGAR IN THE MORNING, AT NOON, AND AT BEDTIME   Vitamin D , Ergocalciferol , (DRISDOL ) 1.25 MG (50000 UNIT) CAPS capsule Take 1 capsule (50,000 Units total) by mouth every 7 (seven) days.   amoxicillin  (AMOXIL )  875 MG tablet Take 1 tablet (875 mg total) by mouth every 12 (twelve) hours. (Patient not taking: Reported on 09/01/2024)   diclofenac  Sodium ([employer] DICLOFENAC  SODIUM) 1 % GEL Apply 4 g topically 4 (four) times daily as needed. (Patient not taking: Reported on 09/01/2024)   Methen-Hyosc-Meth Blue-Na Phos (UROGESIC-BLUE) 81.6 MG TABS Take 1 tablet (81.6 mg total) by mouth 4 (four) times daily as needed. (Patient not taking: Reported on 09/01/2024)   SHINGRIX injection Inject 0.5 mLs into the muscle once. (Patient not taking: Reported on 09/01/2024)   No facility-administered encounter medications on file as of 09/01/2024.   Hearing/Vision screen Hearing Screening - Comments:: Denies hearing loss  Vision Screening - Comments:: UTD @ [address]  [facility] Immunizations and Health Maintenance Health Maintenance  Topic Date Due   Lung Cancer Screening  12/22/2013   Influenza Vaccine  05/07/2024   FOOT EXAM  05/21/2024   COVID-19 Vaccine (3 - 2025-26 season) 06/07/2024   HEMOGLOBIN A1C  10/02/2024   OPHTHALMOLOGY EXAM  02/08/2025   Diabetic kidney evaluation - eGFR measurement  04/02/2025   Diabetic kidney evaluation - Urine ACR  04/02/2025   DTaP/Tdap/Td (2 - Td or Tdap) 07/06/2025   Medicare Annual Wellness (AWV)  09/01/2025   Fecal DNA (Cologuard)  04/09/2027   Pneumococcal Vaccine: 50+ Years  Completed   Zoster Vaccines- Shingrix  Completed   Hepatitis B Vaccines 19-59 Average Risk  Aged Out   HPV VACCINES  Aged Out   Meningococcal B Vaccine  Aged Out   Hepatitis C Screening  Discontinued   HIV Screening  Discontinued        Assessment/Plan:  This is a routine wellness examination for Gary Benson.  Patient Care Team: Donzella Lauraine SAILOR, DO as PCP - General (Family Medicine) Pa, [facility], Clotilda DELENA RIGGERS as Physician Assistant (Urology) Lenn Aran, MD as Consulting Physician (Radiation Oncology)  I have personally reviewed and noted the following  in the patient's chart:   Medical and social history Use of alcohol, tobacco or illicit drugs  Current medications and supplements including opioid prescriptions. Functional ability and status Nutritional status Physical activity Advanced directives List of other physicians Hospitalizations, surgeries, and ER visits in previous 12 months Vitals Screenings to include cognitive, depression, and falls Referrals and appointments  No orders of the defined types were placed in this encounter.  In addition, I have reviewed and discussed with patient certain preventive protocols, quality metrics, and best practice recommendations. A written personalized care plan for preventive services as well as general preventive health recommendations were provided to patient.   Vina Ned, CMA   09/01/2024   Return in 53 weeks (on 09/07/2025).  After Visit Summary: (MyChart) Due to this being a telephonic visit, the after visit summary with patients personalized plan was offered to patient via MyChart   Nurse Notes:  6 CIT Score - 4 Needs  flu vaccine at OV 09/01/24 Needs DM foot exam at OV 09/01/24 FBS this morning per patient was 100 Declined Covid vaccine Declined lung cancer screening Declined DM & Nutrition education referral

## 2024-09-02 LAB — COMPREHENSIVE METABOLIC PANEL WITH GFR
ALT: 10 [IU]/L (ref 0–44)
AST: 15 [IU]/L (ref 0–40)
Albumin: 4.1 g/dL (ref 3.9–4.9)
Alkaline Phosphatase: 63 [IU]/L (ref 47–123)
BUN/Creatinine Ratio: 8 — ABNORMAL LOW (ref 10–24)
BUN: 8 mg/dL (ref 8–27)
Bilirubin Total: <0.2 mg/dL (ref 0.0–1.2)
CO2: 21 mmol/L (ref 20–29)
Calcium: 9.4 mg/dL (ref 8.6–10.2)
Chloride: 102 mmol/L (ref 96–106)
Creatinine, Ser: 0.99 mg/dL (ref 0.76–1.27)
Globulin, Total: 2.4 g/dL (ref 1.5–4.5)
Glucose: 92 mg/dL (ref 70–99)
Potassium: 4.3 mmol/L (ref 3.5–5.2)
Sodium: 135 mmol/L (ref 134–144)
Total Protein: 6.5 g/dL (ref 6.0–8.5)
eGFR: 86 mL/min/{1.73_m2}

## 2024-09-02 LAB — VITAMIN B12: Vitamin B-12: 513 pg/mL (ref 232–1245)

## 2024-09-02 LAB — LIPID PANEL WITH LDL/HDL RATIO
Cholesterol, Total: 101 mg/dL (ref 100–199)
HDL: 33 mg/dL — ABNORMAL LOW
LDL Chol Calc (NIH): 39 mg/dL (ref 0–99)
LDL/HDL Ratio: 1.2 ratio (ref 0.0–3.6)
Triglycerides: 176 mg/dL — ABNORMAL HIGH (ref 0–149)
VLDL Cholesterol Cal: 29 mg/dL (ref 5–40)

## 2024-09-02 LAB — HEMOGLOBIN A1C
Est. average glucose Bld gHb Est-mCnc: 131 mg/dL
Hgb A1c MFr Bld: 6.2 % — ABNORMAL HIGH (ref 4.8–5.6)

## 2024-09-02 LAB — VITAMIN D 25 HYDROXY (VIT D DEFICIENCY, FRACTURES): Vit D, 25-Hydroxy: 53.3 ng/mL (ref 30.0–100.0)

## 2024-09-05 ENCOUNTER — Other Ambulatory Visit: Payer: Self-pay | Admitting: Family Medicine

## 2024-09-05 DIAGNOSIS — G8929 Other chronic pain: Secondary | ICD-10-CM

## 2024-09-06 NOTE — Telephone Encounter (Signed)
 Please review in provider's absence.  Thank you.  LOV- 09/01/2024 NOV- 03/04/2025 LRF- 06/23/2024 Outpatient Medication Detail   Disp Refills Start End   meloxicam  (MOBIC ) 15 MG tablet 30 tablet 3 06/23/2024 --   Sig - Route: Take 1 tablet (15 mg total) by mouth daily. - Oral   Sent to pharmacy as: meloxicam  (MOBIC ) 15 MG tablet   E-Prescribing Status: Receipt confirmed by pharmacy (06/23/2024  7:54 AM EDT)

## 2024-09-07 NOTE — Telephone Encounter (Signed)
Pt should have refills left.  

## 2024-09-08 ENCOUNTER — Ambulatory Visit: Payer: Self-pay | Admitting: Family Medicine

## 2024-09-09 ENCOUNTER — Encounter: Payer: Self-pay | Admitting: Family Medicine

## 2024-09-09 DIAGNOSIS — G8929 Other chronic pain: Secondary | ICD-10-CM

## 2024-09-10 MED ORDER — MELOXICAM 15 MG PO TABS: 15.0000 mg | ORAL_TABLET | Freq: Every day | ORAL | 0 refills | Status: AC | PRN

## 2024-09-13 ENCOUNTER — Telehealth: Payer: Self-pay | Admitting: Urology

## 2024-09-13 NOTE — Telephone Encounter (Signed)
`   His appointment for Eligard  was scheduled to soon.  We need to get him rescheduled

## 2024-09-14 ENCOUNTER — Other Ambulatory Visit: Payer: Self-pay

## 2024-09-14 ENCOUNTER — Telehealth: Payer: Self-pay | Admitting: Family Medicine

## 2024-09-14 DIAGNOSIS — E1169 Type 2 diabetes mellitus with other specified complication: Secondary | ICD-10-CM

## 2024-09-14 MED ORDER — ROSUVASTATIN CALCIUM 20 MG PO TABS: 20.0000 mg | ORAL_TABLET | Freq: Every day | ORAL | 0 refills | Status: AC

## 2024-09-14 NOTE — Telephone Encounter (Signed)
 Converted to rx request

## 2024-09-14 NOTE — Telephone Encounter (Signed)
 [facility] faxed refill request for the following medications:  rosuvastatin  (CRESTOR ) 20 MG tablet     Please advise.

## 2024-09-16 ENCOUNTER — Other Ambulatory Visit: Payer: Self-pay | Admitting: Family Medicine

## 2024-09-16 DIAGNOSIS — E559 Vitamin D deficiency, unspecified: Secondary | ICD-10-CM

## 2024-09-21 ENCOUNTER — Ambulatory Visit: Admitting: Urology

## 2024-10-05 ENCOUNTER — Encounter (INDEPENDENT_AMBULATORY_CARE_PROVIDER_SITE_OTHER)

## 2024-10-07 NOTE — Progress Notes (Signed)
 "    10/11/2024 3:09 PM   Gary Benson 1961/02/24 [id number]  Referring provider: Donzella Lauraine SAILOR, DO [address] [place],  [place] [address]  Urological history: 1.  High risk prostate cancer - PSA (10/2024) <0.02 - I PSA 33.8 - Gleason 4+3 on the right side. TRUS volume 28.6 - PET (08/2023) no evidence of metastasis - IMRT w/ brachy-boost (11/2023) - ADT through (10/2026)   2. Urgency - Vesicare  10 mg daily   Chief Complaint  Patient presents with   Prostate Cancer   HPI: Gary Benson is a 64 y.o. man who presents today 6 month follow up for prostate cancer.   Previous records reviewed.   I PSS 2/1  He reports nocturia x 2.   Patient denies any modifying or aggravating factors.  Patient denies any recent UTI's, gross hematuria, dysuria or suprapubic/flank pain.  Patient denies any fevers, chills, nausea or vomiting.    PSA <0.02  Serum creatinine (08/2024) 0.99  Hemoglobin A1c (08/2024) 6.2  BPH meds: tamsulosin  0.4 mg daily  OAB meds: solifenacin  10 mg daily   He is not experiencing any hot flashes.  He is still taking his vitamin D  and calcium  as recommended.   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    Prostate cancer (HCC)    Psychophysiologic insomnia    RLS (restless legs syndrome)    Stroke (HCC) 2015   Tobacco use     Surgical History: Past Surgical History:  Procedure Laterality Date   ANKLE FRACTURE  SURGERY Right    CORONARY ANGIOPLASTY WITH STENT PLACEMENT     EYE SURGERY Bilateral    as a child   HERNIA REPAIR Right    RADIOACTIVE SEED IMPLANT N/A 11/24/2023   Procedure: RADIOACTIVE SEED IMPLANT/BRACHYTHERAPY IMPLANT (76 seeds);  Surgeon: Penne Knee, MD;  Location: [facility] ORS;  Service: Urology;  Laterality: N/A;    Home Medications:  Allergies as of 10/11/2024       Reactions   Bee Venom Anaphylaxis        Medication List        Accurate as of October 11, 2024  3:09 PM. If you have any questions, ask your nurse or doctor.          acetaminophen  500 MG tablet Commonly known as: TYLENOL  Take 1,000 mg by mouth every morning.   aspirin  EC 81 MG tablet Take 81 mg by mouth daily. Swallow whole.   carvedilol  3.125 MG tablet Commonly known as: COREG  Take 1 tablet (3.125 mg total) by mouth 2 (two) times daily with a meal.   cyanocobalamin  1000 MCG tablet Commonly known as: VITAMIN B12 Take 1,000 mcg by mouth daily.   EPINEPHrine  0.3 mg/0.3 mL Soaj injection Commonly known as: EPI-PEN Inject 0.3 mg into the muscle as needed for anaphylaxis.   famotidine 20  MG tablet Commonly known as: PEPCID Take 20 mg by mouth every morning.   gabapentin  300 MG capsule Commonly known as: NEURONTIN  TAKE 1 CAPSULE THREE TIMES DAILY   lisinopril  5 MG tablet Commonly known as: ZESTRIL  Take 1 tablet (5 mg total) by mouth every morning.   meloxicam  15 MG tablet Commonly known as: MOBIC  Take 1 tablet (15 mg total) by mouth daily as needed for pain.   metFORMIN  500 MG 24 hr tablet Commonly known as: GLUCOPHAGE -XR Take 1 tablet (500 mg total) by mouth 2 (two) times daily with a meal.   potassium chloride  10 MEQ tablet Commonly known as: KLOR-CON  TAKE 1 TABLET EVERY DAY   rosuvastatin  20 MG tablet Commonly known as: CRESTOR  Take 1 tablet (20 mg total) by mouth daily.   sertraline  100 MG tablet Commonly known as: ZOLOFT  Take 1 tablet (100 mg total) by mouth every  morning.   solifenacin  10 MG tablet Commonly known as: VESICARE  Take 1 tablet (10 mg total) by mouth daily.   tamsulosin  0.4 MG Caps capsule Commonly known as: FLOMAX  TAKE 1 CAPSULE EVERY DAY   True Metrix Air Glucose Meter w/Device Kit USE AS DIRECTED   True Metrix Blood Glucose Test test strip Generic drug: glucose blood TEST BLOOD SUGAR IN THE MORNING, AT NOON, AND AT BEDTIME   TRUEdraw Lancing Device Misc USE AS DIRECTED   TRUEplus Lancets 33G Misc TEST BLOOD SUGAR IN THE MORNING, AT NOON, AND AT BEDTIME   Vitamin D  (Ergocalciferol ) 1.25 MG (50000 UNIT) Caps capsule Commonly known as: DRISDOL  TAKE 1 CAPSULE EVERY 7 DAYS        Allergies:  Allergies  Allergen Reactions   Bee Venom Anaphylaxis    Family History: Family History  Problem Relation Age of Onset   Diabetes Mother    Heart disease Father    Diabetes Sister     Social History: See HPI for pertinent social history  ROS: Pertinent ROS in HPI  Physical Exam: BP 100/68   Pulse 73   Wt 162 lb (73.5 kg)   SpO2 99%   BMI 23.92 kg/m   Constitutional:  Well nourished. Alert and oriented, No acute distress. HEENT: [place] AT, moist mucus membranes.  Trachea midline Cardiovascular: No clubbing, cyanosis, or edema. Respiratory: Normal respiratory effort, no increased work of breathing. Neurologic: Grossly intact, no focal deficits, moving all 4 extremities. Psychiatric: Normal mood and affect.   Laboratory Data: See EPIC and HPI  I have reviewed the labs.   Pertinent Imaging: N/A  Assessment & Plan:    1. High risk prostate cancer - PSA demonstrates good biochemical control of the prostate cancer at this time - Continue to monitor every 6 months - return in July for next Eligard  injection - Advised patient to continue taking calcium  1200 mg daily divided up in several doses with food and vitamin D  800 international units daily with a fatty meal to protect his bones  2. Urgency - continue  Vesicare  to 10 mg daily - continue tamsulosin  0.4 mg daily   Return in about 6 months (around 04/10/2025) for PSA, Eligard  and office visit .  These notes generated with voice recognition software. I apologize for typographical errors.  CLOTILDA HELON RIGGERS  [facility] [address]  [address] [place], [place] [address] [phone]  "

## 2024-10-08 ENCOUNTER — Other Ambulatory Visit: Payer: Self-pay | Admitting: *Deleted

## 2024-10-08 ENCOUNTER — Inpatient Hospital Stay: Attending: Radiation Oncology

## 2024-10-08 DIAGNOSIS — C775 Secondary and unspecified malignant neoplasm of intrapelvic lymph nodes: Secondary | ICD-10-CM | POA: Diagnosis present

## 2024-10-08 DIAGNOSIS — C61 Malignant neoplasm of prostate: Secondary | ICD-10-CM | POA: Diagnosis present

## 2024-10-08 LAB — PSA: Prostatic Specific Antigen: <0.02 ng/mL (ref 0.00–4.00)

## 2024-10-11 ENCOUNTER — Ambulatory Visit: Admitting: Urology

## 2024-10-11 ENCOUNTER — Encounter: Payer: Self-pay | Admitting: Urology

## 2024-10-11 VITALS — BP 100/68 | HR 73 | Wt 162.0 lb

## 2024-10-11 DIAGNOSIS — C61 Malignant neoplasm of prostate: Secondary | ICD-10-CM

## 2024-10-11 DIAGNOSIS — R3915 Urgency of urination: Secondary | ICD-10-CM | POA: Diagnosis not present

## 2024-10-11 LAB — BLADDER SCAN AMB NON-IMAGING

## 2024-10-11 MED ORDER — SOLIFENACIN SUCCINATE 10 MG PO TABS: 10.0000 mg | ORAL_TABLET | Freq: Every day | ORAL | 3 refills | Status: AC

## 2024-10-11 MED ORDER — LEUPROLIDE ACETATE (6 MONTH) 45 MG ~~LOC~~ KIT
45.0000 mg | PACK | Freq: Once | SUBCUTANEOUS | Status: AC
  Administered 2024-10-11: 45 mg via SUBCUTANEOUS

## 2024-10-11 NOTE — Progress Notes (Signed)
 Eligard  SubQ Injection   Due to Prostate Cancer patient is present today for a Eligard  Injection.  Medication: Eligard  6 month Dose: 45 mg  Location: right arm Lot: [id number]  NDC# 37064-538-49 Exp: 04/2025  Patient tolerated well, no complications were noted  Performed by: Andrea Kirks LPN   Per  CANDIE Cornwall PA patient is to continue therapy for 6 months . Patient's next follow up was scheduled for 04/04/25. This appointment was scheduled using wheel and given to patient today along with reminder continue on Vitamin D  800-1000iu and Calcium  1000-1200mg  daily while on Androgen Deprivation Therapy.  PA approval dates:

## 2024-10-14 ENCOUNTER — Ambulatory Visit: Admitting: Radiation Oncology

## 2024-11-03 ENCOUNTER — Other Ambulatory Visit: Payer: Self-pay | Admitting: Family Medicine

## 2024-11-03 DIAGNOSIS — E1165 Type 2 diabetes mellitus with hyperglycemia: Secondary | ICD-10-CM

## 2024-11-08 ENCOUNTER — Ambulatory Visit: Admitting: Radiation Oncology

## 2024-11-29 ENCOUNTER — Ambulatory Visit: Admitting: Radiation Oncology

## 2025-03-04 ENCOUNTER — Encounter: Admitting: Family Medicine

## 2025-03-28 ENCOUNTER — Other Ambulatory Visit

## 2025-04-04 ENCOUNTER — Ambulatory Visit: Admitting: Urology

## 2025-09-07 ENCOUNTER — Ambulatory Visit
